# Patient Record
Sex: Female | Born: 1995 | Race: Asian | Hispanic: No | Smoking: Never smoker
Health system: Southern US, Community
[De-identification: ages and names within clinical notes are randomized; demographics above are authoritative.]

## PROBLEM LIST (undated history)

## (undated) DIAGNOSIS — B999 Unspecified infectious disease: Secondary | ICD-10-CM

## (undated) DIAGNOSIS — Z789 Other specified health status: Secondary | ICD-10-CM

## (undated) DIAGNOSIS — L509 Urticaria, unspecified: Secondary | ICD-10-CM

## (undated) HISTORY — PX: NO PAST SURGERIES: SHX2092

## (undated) HISTORY — DX: Urticaria, unspecified: L50.9

## (undated) HISTORY — DX: Unspecified infectious disease: B99.9

---
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
## 2013-01-01 ENCOUNTER — Ambulatory Visit (HOSPITAL_COMMUNITY)
Admission: RE | Admit: 2013-01-01 | Discharge: 2013-01-01 | Disposition: A | Discharge status: Home / Self Care | Payer: Medicaid Other | Source: Ambulatory Visit | Attending: Obstetrics and Gynecology | Admitting: Obstetrics and Gynecology

## 2013-01-01 NOTE — Lactation Note (Signed)
Infant Lactation Consultation Outpatient Visit Note  Patient Name: Kaitlyn Crane   Baby name: Marylynne Keelin Date of Birth: 1996/10/15          DOB: [birth year] Birth Weight:  6 lbs 5.2oz      Today's weight: 12 lbs. 15.2 oz Gestational Age at Delivery: 38.2 weeks   Mom comes in today needing help relatching Jayden to breast.  2 weeks ago,  Mom stated baby refused to latch onto breast.  Baby had been introduced to bottles at 73 months of age.  He was feeding from a regular flow bottle.  Mom has not had baby on the breast for 2 weeks, and has been pumping 2 times a day using the Medela Lactina double pump.  She used to get 8 oz per pumping, and now only gets 5 oz.  Talked about supporting her milk supply with more frequent pumping, talked about using manual pump when at school.  She usually goes 6 hrs without pumping at school.  Mom has flat nipples, and was using a 20 mm nipple shield, but she lost it.  She bought one in the store, but could only get the 24 mm which was too big.  On assessment, breasts are soft, with flat nipples.  Mom states she last pumped last night at 11 pm.  Initiated a 20 mm nipple shield, reviewed proper way to use it.  Assisted Mom in supporting her breast, compressing, and massage.  A small amount of milk introduced into shield.  Baby positioned in cross cradle hold.  He was cooing and playing, and licking the nipple, and would not latch on.  Baby had been fed 2 oz an hour ago by bottle.  Talked about getting a slow flow wide based nipple.  Mom purchased a Medela bottle with nipple.    Plan- 1- increase pumping frequency to support milk supply (15-20 mins about 6 times a day at least) 2-Try to latch Jayden when he is more hungry, using the 20 mm nipple shield 3- Try to latch Jayden when he is sleepy 4- increase skin to skin, and bottle feed Jayden turned in on the breast 5- massage breasts before and during feedings and pumping    Follow-Up  To call us as needed    Judee Clara 01/01/2013, 11:37 AM

## 2013-01-02 ENCOUNTER — Ambulatory Visit (HOSPITAL_COMMUNITY): Payer: Medicaid Other

## 2014-08-16 ENCOUNTER — Encounter: Payer: Self-pay | Admitting: Obstetrics and Gynecology

## 2021-06-01 ENCOUNTER — Emergency Department (HOSPITAL_COMMUNITY)
Admission: EM | Admit: 2021-06-01 | Discharge: 2021-06-01 | Disposition: A | Discharge status: Home / Self Care | Payer: Medicaid Other | Attending: Emergency Medicine | Admitting: Emergency Medicine

## 2021-06-01 ENCOUNTER — Other Ambulatory Visit: Payer: Self-pay

## 2021-06-01 ENCOUNTER — Encounter (HOSPITAL_COMMUNITY): Payer: Self-pay | Admitting: Emergency Medicine

## 2021-06-01 DIAGNOSIS — L739 Follicular disorder, unspecified: Secondary | ICD-10-CM | POA: Diagnosis not present

## 2021-06-01 DIAGNOSIS — R21 Rash and other nonspecific skin eruption: Secondary | ICD-10-CM | POA: Diagnosis present

## 2021-06-01 LAB — URINALYSIS, ROUTINE W REFLEX MICROSCOPIC
Bilirubin Urine: NEGATIVE
Glucose, UA: NEGATIVE mg/dL
Ketones, ur: NEGATIVE mg/dL
Nitrite: NEGATIVE
Protein, ur: NEGATIVE mg/dL
Specific Gravity, Urine: 1.009 (ref 1.005–1.030)
pH: 6 (ref 5.0–8.0)

## 2021-06-01 LAB — CBC WITH DIFFERENTIAL/PLATELET
Abs Immature Granulocytes: 0.03 10*3/uL (ref 0.00–0.07)
Basophils Absolute: 0 10*3/uL (ref 0.0–0.1)
Basophils Relative: 0 %
Eosinophils Absolute: 0.1 10*3/uL (ref 0.0–0.5)
Eosinophils Relative: 1 %
HCT: 34.4 % — ABNORMAL LOW (ref 36.0–46.0)
Hemoglobin: 10.9 g/dL — ABNORMAL LOW (ref 12.0–15.0)
Immature Granulocytes: 0 %
Lymphocytes Relative: 32 %
Lymphs Abs: 3.4 10*3/uL (ref 0.7–4.0)
MCH: 26 pg (ref 26.0–34.0)
MCHC: 31.7 g/dL (ref 30.0–36.0)
MCV: 81.9 fL (ref 80.0–100.0)
Monocytes Absolute: 0.5 10*3/uL (ref 0.1–1.0)
Monocytes Relative: 5 %
Neutro Abs: 6.5 10*3/uL (ref 1.7–7.7)
Neutrophils Relative %: 62 %
Platelets: 270 10*3/uL (ref 150–400)
RBC: 4.2 MIL/uL (ref 3.87–5.11)
RDW: 13.6 % (ref 11.5–15.5)
WBC: 10.5 10*3/uL (ref 4.0–10.5)
nRBC: 0 % (ref 0.0–0.2)

## 2021-06-01 LAB — RPR: RPR Ser Ql: NONREACTIVE

## 2021-06-01 LAB — HIV ANTIBODY (ROUTINE TESTING W REFLEX): HIV Screen 4th Generation wRfx: NONREACTIVE

## 2021-06-01 MED ORDER — MUPIROCIN CALCIUM 2 % EX CREA: 1.0000 "application " | TOPICAL_CREAM | Freq: Two times a day (BID) | CUTANEOUS | 0 refills | Status: DC

## 2021-06-01 MED ORDER — CEPHALEXIN 500 MG PO CAPS: 500.0000 mg | ORAL_CAPSULE | Freq: Four times a day (QID) | ORAL | 0 refills | Status: AC

## 2021-06-01 NOTE — ED Provider Notes (Signed)
Emergency Medicine Provider Triage Evaluation Note  Shambhavi Metheny , a 25 y.o. female  was evaluated in triage.  Pt complains of fluid filled cysts or abscesses on the right forearm, buttocks, and around the vulva / inguinal folds x 3 weeks. The patient reports she was evaluated by her PCP and prescribed antibiotics without relief. The patient reports she has been feeling feverish. She is able to express yellowish fluid followed by blood if she squeezes the sites. She reports she waxes and shaves her groin.  Review of Systems  Positive: Subjective fever Negative: Vaginal discharge, dysuria  Physical Exam  BP 100/67 (BP Location: Right Arm)   Pulse 86   Temp 98.3 F (36.8 C) (Oral)   Resp 18   SpO2 99%  Gen:   Awake, no distress   Resp:  Normal effort  MSK:   Moves extremities without difficulty  Other:  Red swollen nodule on right forearm with open pore  Medical Decision Making  Medically screening exam initiated at 1:02 AM.  Appropriate orders placed.  Tienna Hufford was informed that the remainder of the evaluation will be completed by another provider, this initial triage assessment does not replace that evaluation, and the importance of remaining in the ED until their evaluation is complete.  Skin lesions / cysts   Olene Floss, PA-C 06/01/21 0107    Glynn Octave, MD 06/01/21 [phone]

## 2021-06-01 NOTE — ED Triage Notes (Signed)
Pt has several abscesses, on her right arm, bottom and pelvic area X3 weeks.  She has been "popping them at home."

## 2021-06-01 NOTE — ED Provider Notes (Signed)
[facility]   CSN: [id number] Arrival date & time: 06/01/21  0040     History Chief Complaint  Patient presents with   Abscess    Kaitlyn Crane is a 25 y.o. female.  Patient is a 25 year old female that is presenting for concern for skin infection on her right forearm, buttocks, and pelvis. Patient states that these have been here for 3 weeks.  She was previously seen by her OB/GYN for this and was told to apply antibacterial soap.  She states she has been doing this daily.  She denies any fevers or chills.  She states that she has had to drain these fluid-filled cysts in which she expressed yellow fluid in the past. At this time there are no fluid filled cysts.  She denies any vaginal discharge, dysuria, hematuria, vaginal bleeding.  Patient denies having any pain from this.  She denies any new recent detergents, new exposures.  She denies any unprotected sex.    Patient denies any fevers, chills, chest pain, headache, neck stiffness, shortness of breath, nausea, vomiting, diarrhea, numbness or weakness.  Rash Location:  Shoulder/arm and pelvis Shoulder/arm rash location:  R forearm Pelvic rash location:  L buttock, R buttock and pelvis Duration:  3 weeks Context: not animal contact, not chemical exposure, not food, not medications, not new detergent/soap and not nuts   Associated symptoms: no abdominal pain, no diarrhea, no fatigue, no fever, no headaches, no hoarse voice, no induration, no joint pain, no myalgias, no nausea, no periorbital edema, no shortness of breath, no sore throat, no throat swelling, no tongue swelling, no URI, not vomiting and not wheezing       Past Medical History:  Diagnosis Date   Infection 2012   UTERUS ?   No pertinent past medical history     Patient Active Problem List   Diagnosis Date Noted   Encounter for insertion of mirena IUD - inserted 12/12/12 12/12/2012   SVD (spontaneous vaginal  delivery) 09/23/2012   Chorioamnionitis 09/23/2012   Late prenatal care 08/27/2012   Normal pregnancy, first 08/11/2012    Past Surgical History:  Procedure Laterality Date   NO PAST SURGERIES       OB History     Gravida  1   Para  1   Term  1   Preterm  0   AB  0   Living  1      SAB  0   IAB  0   Ectopic  0   Multiple  0   Live Births  1           Family History  Problem Relation Age of Onset   Alcohol abuse Neg Hx     Social History   Tobacco Use   Smoking status: Never   Smokeless tobacco: Never  Substance Use Topics   Alcohol use: No    Home Medications Prior to Admission medications   Medication Sig Start Date End Date Taking? Authorizing Provider  cephALEXin (KEFLEX) 500 MG capsule Take 1 capsule (500 mg total) by mouth 4 (four) times daily for 7 days. 06/01/21 06/08/21 Yes Lottie DawsonByouk, Dilan Fullenwider, MD  mupirocin cream (BACTROBAN) 2 % Apply 1 application topically 2 (two) times daily. 06/01/21  Yes Lottie DawsonByouk, Claudia Alvizo, MD  dibucaine (NUPERCAINAL) 1 % OINT Place 1 application rectally as needed. 09/25/12   Lavera GuiseKrebsbach, Mary, CNM  Ferrous Sulfate (IRON) 325 (65 FE) MG TABS Take 1 tablet by mouth 2 (two)  times daily. 07/28/12   Osborn Coho, MD  ferrous sulfate 325 (65 FE) MG tablet Take 325 mg by mouth daily with breakfast.    [provider]  ferrous sulfate 325 (65 FE) MG tablet Take 1 tablet (325 mg total) by mouth 2 (two) times daily. 07/28/12   Lavera Guise, CNM  ibuprofen (ADVIL,MOTRIN) 600 MG tablet Take 1 tablet (600 mg total) by mouth every 6 (six) hours as needed for pain. 09/25/12   Lavera Guise, CNM  oxyCODONE-acetaminophen (PERCOCET/ROXICET) 5-325 MG per tablet Take 1 tablet by mouth every 4 (four) hours as needed (moderate - severe pain). 09/25/12   Lavera Guise, CNM  Prenatal MV-Min-Fe Fum-FA-DHA [facility] PRENATAL EARLY SHIELD) 27-0.8 & 200 MG MISC Take 1 tablet by mouth daily. 08/11/12   Haygood, Maris Berger, MD  Prenatal Vit-Fe  Fumarate-FA (PRENATAL MULTIVITAMIN) TABS Take 1 tablet by mouth daily.    [provider]    Allergies    Patient has no known allergies.  Review of Systems   Review of Systems  Constitutional:  Negative for chills, diaphoresis, fatigue and fever.  HENT:  Negative for congestion, dental problem, ear discharge, ear pain, facial swelling, hearing loss, hoarse voice, nosebleeds, postnasal drip, rhinorrhea, sinus pain, sneezing, sore throat and trouble swallowing.   Eyes:  Negative for pain and visual disturbance.  Respiratory:  Negative for cough, chest tightness, shortness of breath, wheezing and stridor.   Cardiovascular:  Negative for chest pain, palpitations and leg swelling.  Gastrointestinal:  Negative for abdominal distention, abdominal pain, blood in stool, constipation, diarrhea, nausea and vomiting.  Endocrine: Negative for polydipsia and polyuria.  Genitourinary:  Negative for difficulty urinating, dysuria, flank pain, frequency, hematuria, urgency, vaginal bleeding and vaginal discharge.  Musculoskeletal:  Negative for arthralgias, myalgias, neck pain and neck stiffness.  Skin:  Positive for rash. Negative for wound.  Allergic/Immunologic: Negative for environmental allergies and food allergies.  Neurological:  Negative for dizziness, seizures, syncope, facial asymmetry, speech difficulty, weakness, light-headedness, numbness and headaches.  Psychiatric/Behavioral:  Negative for agitation, behavioral problems and confusion.    Physical Exam Updated Vital Signs BP 118/77   Pulse 90   Temp 98.5 F (36.9 C) (Oral)   Resp 16   SpO2 100%   Physical Exam Vitals and nursing note reviewed.  Constitutional:      General: She is not in acute distress.    Appearance: Normal appearance. She is normal weight. She is not ill-appearing.  HENT:     Head: Normocephalic and atraumatic.     Right Ear: External ear normal.     Left Ear: External ear normal.     Nose: Nose  normal. No congestion.     Mouth/Throat:     Mouth: Mucous membranes are moist.     Pharynx: Oropharynx is clear. No oropharyngeal exudate or posterior oropharyngeal erythema.  Eyes:     General: No visual field deficit.    Extraocular Movements: Extraocular movements intact.     Conjunctiva/sclera: Conjunctivae normal.     Pupils: Pupils are equal, round, and reactive to light.  Neck:     Vascular: No carotid bruit.  Cardiovascular:     Rate and Rhythm: Normal rate and regular rhythm.     Pulses: Normal pulses.     Heart sounds: Normal heart sounds. No murmur heard. Pulmonary:     Effort: Pulmonary effort is normal. No respiratory distress.     Breath sounds: Normal breath sounds. No stridor. No wheezing, rhonchi or rales.  Chest:     Chest wall: No tenderness.  Abdominal:     General: Bowel sounds are normal. There is no distension.     Palpations: Abdomen is soft.     Tenderness: There is no abdominal tenderness. There is no right CVA tenderness, left CVA tenderness, guarding or rebound.  Musculoskeletal:        General: No swelling or tenderness. Normal range of motion.     Cervical back: Normal range of motion and neck supple. No rigidity, tenderness or bony tenderness.     Thoracic back: Normal. No tenderness or bony tenderness.     Lumbar back: Normal. No tenderness or bony tenderness.     Right lower leg: No edema.     Left lower leg: No edema.  Skin:    General: Skin is warm and dry.     Coloration: Skin is not jaundiced.     Comments: Folliculitis located on the right forearm, bilateral buttocks, mons pubis No signs of infection, cellulitis, or abscess. Papules on an erythematous base around hair follicles.   Neurological:     General: No focal deficit present.     Mental Status: She is alert and oriented to person, place, and time. Mental status is at baseline.     Cranial Nerves: Cranial nerves are intact. No cranial nerve deficit, dysarthria or facial asymmetry.      Sensory: Sensation is intact. No sensory deficit.     Motor: Motor function is intact. No weakness.     Coordination: Coordination is intact. Finger-Nose-Finger Test normal.     Gait: Gait is intact. Gait normal.  Psychiatric:        Mood and Affect: Mood normal.        Behavior: Behavior normal.        Thought Content: Thought content normal.        Judgment: Judgment normal.    ED Results / Procedures / Treatments   Labs (all labs ordered are listed, but only abnormal results are displayed) Labs Reviewed  CBC WITH DIFFERENTIAL/PLATELET - Abnormal; Notable for the following components:      Result Value   Hemoglobin 10.9 (*)    HCT 34.4 (*)    All other components within normal limits  URINALYSIS, ROUTINE W REFLEX MICROSCOPIC - Abnormal; Notable for the following components:   Hgb urine dipstick MODERATE (*)    Leukocytes,Ua TRACE (*)    Bacteria, UA RARE (*)    All other components within normal limits  RPR  HIV ANTIBODY (ROUTINE TESTING W REFLEX)  GC/CHLAMYDIA PROBE AMP ([place]) NOT AT [facility]    EKG None  Radiology No results found.  Procedures Procedures   Medications Ordered in ED Medications - No data to display  ED Course  I have reviewed the triage vital signs and the nursing notes.  Pertinent labs & imaging results that were available during my care of the patient were reviewed by me and considered in my medical decision making (see chart for details).    MDM Rules/Calculators/A&P                         Ellamae Bellissimo is a 25 y.o. female that is presenting for concern for skin infection on her right forearm, buttocks, and pelvis Patient is hemodynamically stable and in no acute distress.  Patient has had 3 weeks of symptoms consistent with folliculitis.  There are no signs of abscess or cellulitis. She has papules  on an erythematous base with no signs of fluctuance. There are no abscesses to drain. Her labs are unremarkable.   Patient symptoms are most  consistent with folliculitis.  Patient discharged home with 7-day course of Keflex and Bactroban.  Patient will follow up with her PCP.  Patient states compliance and understanding of the plan. I explained labs and imaging to the patient. No further questions at this time from the patient.  The patient is safe and stable for discharge at this time with return precautions provided and a plan for follow-up care in place as needed  The plan for this patient was discussed with Dr. Bernette Mayers, who voiced agreement and who oversaw evaluation and treatment of this patient.   Final Clinical Impression(s) / ED Diagnoses Final diagnoses:  Folliculitis    Rx / DC Orders ED Discharge Orders          Ordered    cephALEXin (KEFLEX) 500 MG capsule  4 times daily        06/01/21 1014    mupirocin cream (BACTROBAN) 2 %  2 times daily        06/01/21 1015             Lottie Dawson, MD 06/01/21 1702    Pollyann Savoy, MD 06/02/21 [phone]

## 2021-06-01 NOTE — Discharge Instructions (Addendum)
Please take keflex four times daily for the next seven days. Apply Bactroban to the affected areas. Please follow up with your PCP.

## 2021-08-16 ENCOUNTER — Ambulatory Visit: Payer: Self-pay | Admitting: *Deleted

## 2021-08-16 NOTE — Telephone Encounter (Signed)
Patient reports her symptoms started Sunday am- 10/23.Patient states she had symptoms: hard time breathing, felt stressed, heart racing, palpitations- patient thought anxiety related. Over the follow days her symptoms have continued with no appetite, no smell, weakness added. Patient advised ED for evaluation. Encouraged patient to follow up with NP appointment with provider for continued care.  Reason for Disposition  Difficulty breathing  Answer Assessment - Initial Assessment Questions 1. DESCRIPTION: "Please describe your heart rate or heartbeat that you are having" (e.g., fast/slow, regular/irregular, skipped or extra beats, "palpitations")     Palpitations, extra beats 2. ONSET: "When did it start?" (Minutes, hours or days)      Sunday 3. DURATION: "How long does it last" (e.g., seconds, minutes, hours)     Hours/days 4. PATTERN "Does it come and go, or has it been constant since it started?"  "Does it get worse with exertion?"   "Are you feeling it now?"     Comes and goes, yes 5. TAP: "Using your hand, can you tap out what you are feeling on a chair or table in front of you, so that I can hear?" (Note: not all patients can do this)       na 6. HEART RATE: "Can you tell me your heart rate?" "How many beats in 15 seconds?"  (Note: not all patients can do this)       13 0 7. RECURRENT SYMPTOM: "Have you ever had this before?" If Yes, ask: "When was the last time?" and "What happened that time?"      no 8. CAUSE: "What do you think is causing the palpitations?"     Not sure 9. CARDIAC HISTORY: "Do you have any history of heart disease?" (e.g., heart attack, angina, bypass surgery, angioplasty, arrhythmia)      no 10. OTHER SYMPTOMS: "Do you have any other symptoms?" (e.g., dizziness, chest pain, sweating, difficulty breathing)       Dizziness, chest pain 11. PREGNANCY: "Is there any chance you are pregnant?" "When was your last menstrual period?"       No- IUD  Protocols used: Heart  Rate and Heartbeat Questions-A-AH

## 2021-08-17 ENCOUNTER — Telehealth: Payer: Self-pay | Admitting: Family Medicine

## 2021-08-17 DIAGNOSIS — R5383 Other fatigue: Secondary | ICD-10-CM

## 2021-08-17 NOTE — Progress Notes (Signed)
Needs in person eval due to on going evaluate HR reported. Has an appt tomorrow  [place]

## 2021-08-18 ENCOUNTER — Other Ambulatory Visit: Payer: Self-pay

## 2021-08-18 ENCOUNTER — Ambulatory Visit (INDEPENDENT_AMBULATORY_CARE_PROVIDER_SITE_OTHER): Payer: Medicaid Other | Admitting: Family

## 2021-08-18 ENCOUNTER — Encounter: Payer: Self-pay | Admitting: Family

## 2021-08-18 VITALS — BP 110/70 | HR 98 | Temp 98.0°F | Ht 60.0 in | Wt 101.2 lb

## 2021-08-18 DIAGNOSIS — D649 Anemia, unspecified: Secondary | ICD-10-CM | POA: Diagnosis not present

## 2021-08-18 DIAGNOSIS — R8290 Unspecified abnormal findings in urine: Secondary | ICD-10-CM | POA: Diagnosis not present

## 2021-08-18 DIAGNOSIS — R5383 Other fatigue: Secondary | ICD-10-CM | POA: Diagnosis not present

## 2021-08-18 NOTE — Progress Notes (Signed)
Kaitlyn Crane is a 25 y.o. female with the following history as recorded in EpicCare:  Patient Active Problem List   Diagnosis Date Noted   Encounter for insertion of mirena IUD - inserted 12/12/12 12/12/2012   SVD (spontaneous vaginal delivery) 09/23/2012   Chorioamnionitis 09/23/2012   Late prenatal care 08/27/2012   Normal pregnancy, first 08/11/2012    Current Outpatient Medications  Medication Sig Dispense Refill   levonorgestrel (MIRENA, 52 MG,) 20 MCG/DAY IUD Mirena 20 mcg/24 hours (8 yrs) 52 mg intrauterine device  Take 1 device by intrauterine route.     No current facility-administered medications for this visit.    Allergies: Patient has no known allergies.  Past Medical History:  Diagnosis Date   Infection 2012   UTERUS ?   No pertinent past medical history     Past Surgical History:  Procedure Laterality Date   NO PAST SURGERIES      Family History  Problem Relation Age of Onset   Alcohol abuse Neg Hx     Social History   Tobacco Use   Smoking status: Never   Smokeless tobacco: Never  Substance Use Topics   Alcohol use: No    Subjective:   Started 2 weeks ago with sensation of palpitations/ fatigue; feels like appetite has been down; admits that her diet is vegan- does try to get tofu for protein source; history of anemia "entire life." History of Vitamin D deficiency in 2018;  Admits that stress level has been high this year- divorced in June/ had to move to new home/ working on finances; works 6 days/ week as Engineer, civil (consulting);      Objective:  Vitals:   08/18/21 1409  BP: 110/70  Pulse: 98  Temp: 98 F (36.7 C)  TempSrc: Oral  SpO2: 98%  Weight: 101 lb 3.2 oz (45.9 kg)  Height: 5' (1.524 m)    General: Well developed, well nourished, in no acute distress  Skin : Warm and dry.  Head: Normocephalic and atraumatic  Eyes: Sclera and conjunctiva clear; pupils round and reactive to light; extraocular movements intact  Ears: External normal; canals clear;  tympanic membranes normal  Oropharynx: Pink, supple. No suspicious lesions  Neck: Supple without thyromegaly, adenopathy  Lungs: Respirations unlabored; clear to auscultation bilaterally without wheeze, rales, rhonchi  CVS exam: normal rate and regular rhythm.  Musculoskeletal: No deformities; no active joint inflammation  Extremities: No edema, cyanosis, clubbing  Vessels: Symmetric bilaterally  Neurologic: Alert and oriented; speech intact; face symmetrical; moves all extremities well; CNII-XII intact without focal deficit   Assessment:  1. Other fatigue   2. Abnormal urine odor   3. Anemia, unspecified type     Plan:  ? Anemia vs anxiety; patient is vegan and am concerned that she is not getting enough protein/ calories; will update labs today and determine follow up based on results; if labs are normal, to consider trial of anxiety medication.  This visit occurred during the SARS-CoV-2 public health emergency.  Safety protocols were in place, including screening questions prior to the visit, additional usage of staff PPE, and extensive cleaning of exam room while observing appropriate contact time as indicated for disinfecting solutions.    No follow-ups on file.  Orders Placed This Encounter  Procedures   Urine Culture   CBC with Differential/Platelet   Comp Met (CMET)   TSH   B12   Iron, TIBC and Ferritin Panel    Requested Prescriptions    No prescriptions requested or  ordered in this encounter     

## 2021-08-19 ENCOUNTER — Encounter: Payer: Self-pay | Admitting: Family

## 2021-08-21 ENCOUNTER — Telehealth: Payer: Self-pay

## 2021-08-21 ENCOUNTER — Encounter: Payer: Self-pay | Admitting: Family

## 2021-08-21 ENCOUNTER — Other Ambulatory Visit: Payer: Self-pay | Admitting: Family

## 2021-08-21 ENCOUNTER — Telehealth: Payer: Self-pay | Admitting: Family

## 2021-08-21 LAB — URINE CULTURE
MICRO NUMBER:: 12595830
SPECIMEN QUALITY:: ADEQUATE

## 2021-08-21 LAB — CBC WITH DIFFERENTIAL/PLATELET
Absolute Monocytes: 273 cells/uL (ref 200–950)
Basophils Absolute: 29 cells/uL (ref 0–200)
Basophils Relative: 0.5 %
Eosinophils Absolute: 41 cells/uL (ref 15–500)
Eosinophils Relative: 0.7 %
HCT: 39.4 % (ref 35.0–45.0)
Hemoglobin: 12.6 g/dL (ref 11.7–15.5)
Lymphs Abs: 2250 cells/uL (ref 850–3900)
MCH: 25.8 pg — ABNORMAL LOW (ref 27.0–33.0)
MCHC: 32 g/dL (ref 32.0–36.0)
MCV: 80.6 fL (ref 80.0–100.0)
MPV: 9.2 fL (ref 7.5–12.5)
Monocytes Relative: 4.7 %
Neutro Abs: 3207 cells/uL (ref 1500–7800)
Neutrophils Relative %: 55.3 %
Platelets: 282 10*3/uL (ref 140–400)
RBC: 4.89 10*6/uL (ref 3.80–5.10)
RDW: 13.1 % (ref 11.0–15.0)
Total Lymphocyte: 38.8 %
WBC: 5.8 10*3/uL (ref 3.8–10.8)

## 2021-08-21 LAB — COMPREHENSIVE METABOLIC PANEL
AG Ratio: 1.3 (calc) (ref 1.0–2.5)
ALT: 6 U/L (ref 6–29)
AST: 14 U/L (ref 10–30)
Albumin: 4.6 g/dL (ref 3.6–5.1)
Alkaline phosphatase (APISO): 36 U/L (ref 31–125)
BUN: 11 mg/dL (ref 7–25)
CO2: 28 mmol/L (ref 20–32)
Calcium: 9.4 mg/dL (ref 8.6–10.2)
Chloride: 102 mmol/L (ref 98–110)
Creat: 0.74 mg/dL (ref 0.50–0.96)
Globulin: 3.5 g/dL (calc) (ref 1.9–3.7)
Glucose, Bld: 86 mg/dL (ref 65–99)
Potassium: 3.9 mmol/L (ref 3.5–5.3)
Sodium: 137 mmol/L (ref 135–146)
Total Bilirubin: 0.6 mg/dL (ref 0.2–1.2)
Total Protein: 8.1 g/dL (ref 6.1–8.1)

## 2021-08-21 LAB — IRON,TIBC AND FERRITIN PANEL
%SAT: 39 % (calc) (ref 16–45)
Ferritin: 153 ng/mL (ref 16–154)
Iron: 104 ug/dL (ref 40–190)
TIBC: 264 mcg/dL (calc) (ref 250–450)

## 2021-08-21 LAB — VITAMIN B12: Vitamin B-12: 317 pg/mL (ref 200–1100)

## 2021-08-21 LAB — TSH: TSH: 1.06 mIU/L

## 2021-08-21 MED ORDER — SULFAMETHOXAZOLE-TRIMETHOPRIM 800-160 MG PO TABS: 1.0000 | ORAL_TABLET | Freq: Two times a day (BID) | ORAL | 0 refills | Status: DC

## 2021-08-21 NOTE — Telephone Encounter (Signed)
Labs have been reviewed with patient over MyChart. She understands that she has UTI. She had concerns about G/C testing from ER in August; she was informed that no G/C test was done in August- order was placed but test was not collected. She was asked to make an appointment if she wants G/C testing.

## 2021-08-21 NOTE — Telephone Encounter (Signed)
Pt was needing to go over her lab results, she stated it is very urgent please advise.

## 2021-08-21 NOTE — Telephone Encounter (Signed)
Advised pt on previous mychart message Vernona Rieger has not reviewed labs yet and will take time

## 2021-08-21 NOTE — Telephone Encounter (Signed)
Previous note sent to PCP °

## 2021-08-25 ENCOUNTER — Encounter: Payer: Self-pay | Admitting: Family

## 2021-08-28 ENCOUNTER — Telehealth: Payer: Self-pay | Admitting: Family

## 2021-08-28 NOTE — Telephone Encounter (Signed)
Please call her and get the rash she was mentioning clarified. My concern would be that she had an allergic reaction to the antibiotic- not folliculitis as she was mentioning.

## 2021-08-29 ENCOUNTER — Other Ambulatory Visit: Payer: Self-pay | Admitting: Family

## 2021-08-29 MED ORDER — MUPIROCIN CALCIUM 2 % EX CREA: 1.0000 "application " | TOPICAL_CREAM | Freq: Two times a day (BID) | CUTANEOUS | 0 refills | Status: DC

## 2021-08-29 NOTE — Telephone Encounter (Signed)
I have called the pt and informed her of the message below. She stated understanding.

## 2021-08-29 NOTE — Telephone Encounter (Signed)
Rx of mupirocin cream (BACTROBAN) 2 % has been sent today and pt has been notified.

## 2021-08-29 NOTE — Telephone Encounter (Signed)
I have called the pt back to get clarification. She stated that she had recently shaved her vaginal area with a new shaver and she is now having the same folliculitis like rashes/ bumps in her vaginal and anal area. She learned that keeping the area clean and putting the cream on would help make the area heal. In the ER she got antibiotics and cream and it worked well.   These bumps are only in the areas she shaved and would like a cream to start.

## 2021-10-31 ENCOUNTER — Encounter: Payer: Medicaid Other | Admitting: Family

## 2021-11-16 ENCOUNTER — Other Ambulatory Visit (HOSPITAL_COMMUNITY)
Admission: RE | Admit: 2021-11-16 | Discharge: 2021-11-16 | Disposition: A | Discharge status: Home / Self Care | Payer: Medicaid Other | Source: Ambulatory Visit | Attending: Family | Admitting: Family

## 2021-11-16 ENCOUNTER — Encounter: Payer: Self-pay | Admitting: Family

## 2021-11-16 ENCOUNTER — Ambulatory Visit (INDEPENDENT_AMBULATORY_CARE_PROVIDER_SITE_OTHER): Payer: Medicaid Other | Admitting: Family

## 2021-11-16 VITALS — BP 110/50 | HR 78 | Temp 98.1°F | Ht 60.0 in | Wt 98.6 lb

## 2021-11-16 DIAGNOSIS — Z113 Encounter for screening for infections with a predominantly sexual mode of transmission: Secondary | ICD-10-CM | POA: Diagnosis not present

## 2021-11-16 DIAGNOSIS — Z0000 Encounter for general adult medical examination without abnormal findings: Secondary | ICD-10-CM

## 2021-11-16 DIAGNOSIS — R8290 Unspecified abnormal findings in urine: Secondary | ICD-10-CM | POA: Diagnosis not present

## 2021-11-16 DIAGNOSIS — Z13220 Encounter for screening for lipoid disorders: Secondary | ICD-10-CM

## 2021-11-16 DIAGNOSIS — Z124 Encounter for screening for malignant neoplasm of cervix: Secondary | ICD-10-CM | POA: Diagnosis not present

## 2021-11-16 NOTE — Progress Notes (Addendum)
Kaitlyn Crane is a 26 y.o. female with the following history as recorded in EpicCare:  Patient Active Problem List   Diagnosis Date Noted   Encounter for insertion of mirena IUD - inserted 12/12/12 12/12/2012   SVD (spontaneous vaginal delivery) 09/23/2012   Chorioamnionitis 09/23/2012   Late prenatal care 08/27/2012   Normal pregnancy, first 08/11/2012    Current Outpatient Medications  Medication Sig Dispense Refill   levonorgestrel (MIRENA, 52 MG,) 20 MCG/DAY IUD Mirena 20 mcg/24 hours (8 yrs) 52 mg intrauterine device  Take 1 device by intrauterine route.     mupirocin cream (BACTROBAN) 2 % Apply 1 application topically 2 (two) times daily. (Patient not taking: Reported on 11/16/2021) 15 g 0   No current facility-administered medications for this visit.    Allergies: Patient has no known allergies.  Past Medical History:  Diagnosis Date   Infection 2012   UTERUS ?   No pertinent past medical history     Past Surgical History:  Procedure Laterality Date   NO PAST SURGERIES      Family History  Problem Relation Age of Onset   Alcohol abuse Neg Hx     Social History   Tobacco Use   Smoking status: Never   Smokeless tobacco: Never  Substance Use Topics   Alcohol use: No    Subjective:  Presents for yearly CPE; would like to get pap smear updated and STD screen; concern that UTI from November 2022 has not cleared completely-urine still has strong smell;  LMP- IUD/ per patient due to be changed in 2024;   No other acute concerns today;   Review of Systems  Constitutional: Negative.   HENT: Negative.    Eyes: Negative.   Respiratory: Negative.    Cardiovascular: Negative.   Gastrointestinal: Negative.   Genitourinary: Negative.   Musculoskeletal: Negative.   Skin: Negative.   Neurological: Negative.   Endo/Heme/Allergies: Negative.   Psychiatric/Behavioral: Negative.         Objective:  Vitals:   11/16/21 1321  BP: (!) 110/50  Pulse: 78  Temp: 98.1 F  (36.7 C)  TempSrc: Oral  SpO2: 98%  Weight: 98 lb 9.6 oz (44.7 kg)  Height: 5' (1.524 m)    General: Well developed, well nourished, in no acute distress  Skin : Warm and dry.  Head: Normocephalic and atraumatic  Eyes: Sclera and conjunctiva clear; pupils round and reactive to light; extraocular movements intact  Ears: External normal; canals clear;  Lungs: Respirations unlabored; clear to auscultation bilaterally without wheeze, rales, rhonchi  CVS exam: normal rate and regular rhythm.  Abdomen: Soft; nontender; nondistended;  Musculoskeletal: No deformities; no active joint inflammation  Extremities: No edema, cyanosis, clubbing  Vessels: Symmetric bilaterally  Neurologic: Alert and oriented; speech intact; face symmetrical; moves all extremities well; CNII-XII intact without focal deficit  Pelvic exam: normal external genitalia, vulva, vagina, cervix, uterus and adnexa. IUD strings noted;    Assessment:  1. PE (physical exam), annual   2. Cervical cancer screening   3. Abnormal urine odor   4. Lipid screening   5. Screening for STD (sexually transmitted disease)     Plan:  Age appropriate preventive healthcare needs addressed; encouraged regular eye doctor and dental exams; encouraged regular exercise; will update labs and refills as needed today; follow-up to be determined; She understands that IUD management will have to done by her GYN- has seen [facility] in the past and will contact them regarding follow up;  This visit occurred during the SARS-CoV-2 public health emergency.  Safety protocols were in place, including screening questions prior to the visit, additional usage of staff PPE, and extensive cleaning of exam room while observing appropriate contact time as indicated for disinfecting solutions.    No follow-ups on file.  Orders Placed This Encounter  Procedures   Urine Culture   CBC with Differential/Platelet   Comp Met (CMET)   TSH   Lipid panel    HIV Antibody (routine testing w rflx)   RPR    Requested Prescriptions    No prescriptions requested or ordered in this encounter

## 2021-11-17 LAB — COMPREHENSIVE METABOLIC PANEL
ALT: 7 U/L (ref 0–35)
AST: 15 U/L (ref 0–37)
Albumin: 4.7 g/dL (ref 3.5–5.2)
Alkaline Phosphatase: 35 U/L — ABNORMAL LOW (ref 39–117)
BUN: 5 mg/dL — ABNORMAL LOW (ref 6–23)
CO2: 33 mEq/L — ABNORMAL HIGH (ref 19–32)
Calcium: 9.1 mg/dL (ref 8.4–10.5)
Chloride: 102 mEq/L (ref 96–112)
Creatinine, Ser: 0.66 mg/dL (ref 0.40–1.20)
GFR: 120.07 mL/min (ref >60.00)
Glucose, Bld: 98 mg/dL (ref 70–99)
Potassium: 3.8 mEq/L (ref 3.5–5.1)
Sodium: 138 mEq/L (ref 135–145)
Total Bilirubin: 0.6 mg/dL (ref 0.2–1.2)
Total Protein: 8 g/dL (ref 6.0–8.3)

## 2021-11-17 LAB — CBC WITH DIFFERENTIAL/PLATELET
Basophils Absolute: 0 10*3/uL (ref 0.0–0.1)
Basophils Relative: 0.7 % (ref 0.0–3.0)
Eosinophils Absolute: 0 10*3/uL (ref 0.0–0.7)
Eosinophils Relative: 0.4 % (ref 0.0–5.0)
HCT: 40.3 % (ref 36.0–46.0)
Hemoglobin: 12.6 g/dL (ref 12.0–15.0)
Lymphocytes Relative: 24.9 % (ref 12.0–46.0)
Lymphs Abs: 1.5 10*3/uL (ref 0.7–4.0)
MCHC: 31.4 g/dL (ref 30.0–36.0)
MCV: 81.2 fl (ref 78.0–100.0)
Monocytes Absolute: 0.2 10*3/uL (ref 0.1–1.0)
Monocytes Relative: 3.7 % (ref 3.0–12.0)
Neutro Abs: 4.2 10*3/uL (ref 1.4–7.7)
Neutrophils Relative %: 70.3 % (ref 43.0–77.0)
Platelets: 273 10*3/uL (ref 150.0–400.0)
RBC: 4.96 Mil/uL (ref 3.87–5.11)
RDW: 13.7 % (ref 11.5–15.5)
WBC: 6 10*3/uL (ref 4.0–10.5)

## 2021-11-17 LAB — TSH: TSH: 0.43 u[IU]/mL (ref 0.35–5.50)

## 2021-11-17 LAB — LIPID PANEL
Cholesterol: 109 mg/dL (ref 0–200)
HDL: 35.3 mg/dL — ABNORMAL LOW (ref >39.00)
LDL Cholesterol: 55 mg/dL (ref 0–99)
NonHDL: 73.97
Total CHOL/HDL Ratio: 3
Triglycerides: 95 mg/dL (ref 0.0–149.0)
VLDL: 19 mg/dL (ref 0.0–40.0)

## 2021-11-19 LAB — URINE CULTURE
MICRO NUMBER:: 12955116
SPECIMEN QUALITY:: ADEQUATE

## 2021-11-19 LAB — HIV ANTIBODY (ROUTINE TESTING W REFLEX): HIV 1&2 Ab, 4th Generation: NONREACTIVE

## 2021-11-19 LAB — RPR: RPR Ser Ql: NONREACTIVE

## 2021-11-20 ENCOUNTER — Other Ambulatory Visit: Payer: Self-pay | Admitting: Family

## 2021-11-20 DIAGNOSIS — N390 Urinary tract infection, site not specified: Secondary | ICD-10-CM

## 2021-11-20 LAB — CYTOLOGY - PAP
Chlamydia: NEGATIVE
Comment: NEGATIVE
Comment: NEGATIVE
Comment: NEGATIVE
Comment: NEGATIVE
Comment: NORMAL
HPV 16: NEGATIVE
HPV 18 / 45: NEGATIVE
High risk HPV: POSITIVE — AB
Neisseria Gonorrhea: NEGATIVE
Trichomonas: NEGATIVE

## 2021-11-20 MED ORDER — SULFAMETHOXAZOLE-TRIMETHOPRIM 800-160 MG PO TABS: 1.0000 | ORAL_TABLET | Freq: Two times a day (BID) | ORAL | 0 refills | Status: DC

## 2021-11-21 ENCOUNTER — Other Ambulatory Visit: Payer: Self-pay

## 2021-11-21 MED ORDER — MUPIROCIN 2 % EX OINT: 1.0000 "application " | TOPICAL_OINTMENT | Freq: Two times a day (BID) | CUTANEOUS | 0 refills | Status: DC | PRN

## 2021-11-21 NOTE — Telephone Encounter (Signed)
Pt did ask about the cream on her med list changed to the ointment for her shaving bumps.  I have penned the rx

## 2021-11-22 DIAGNOSIS — D649 Anemia, unspecified: Secondary | ICD-10-CM | POA: Diagnosis not present

## 2021-11-22 DIAGNOSIS — Z01419 Encounter for gynecological examination (general) (routine) without abnormal findings: Secondary | ICD-10-CM | POA: Diagnosis not present

## 2021-11-22 DIAGNOSIS — N898 Other specified noninflammatory disorders of vagina: Secondary | ICD-10-CM | POA: Diagnosis not present

## 2021-11-22 DIAGNOSIS — E559 Vitamin D deficiency, unspecified: Secondary | ICD-10-CM | POA: Diagnosis not present

## 2021-11-29 ENCOUNTER — Encounter: Payer: Self-pay | Admitting: Family

## 2021-11-29 ENCOUNTER — Other Ambulatory Visit (INDEPENDENT_AMBULATORY_CARE_PROVIDER_SITE_OTHER): Payer: Medicaid Other

## 2021-11-29 DIAGNOSIS — N390 Urinary tract infection, site not specified: Secondary | ICD-10-CM

## 2021-11-30 LAB — URINE CULTURE
MICRO NUMBER:: 13012405
Result:: NO GROWTH
SPECIMEN QUALITY:: ADEQUATE

## 2022-02-02 ENCOUNTER — Emergency Department (HOSPITAL_COMMUNITY)
Admission: EM | Admit: 2022-02-02 | Discharge: 2022-02-03 | Disposition: A | Discharge status: Other Acute Inpatient Hospital | Payer: Medicaid Other | Source: Home / Self Care | Attending: Emergency Medicine | Admitting: Emergency Medicine

## 2022-02-02 DIAGNOSIS — Z20822 Contact with and (suspected) exposure to covid-19: Secondary | ICD-10-CM | POA: Insufficient documentation

## 2022-02-02 DIAGNOSIS — R443 Hallucinations, unspecified: Secondary | ICD-10-CM | POA: Diagnosis not present

## 2022-02-02 DIAGNOSIS — F29 Unspecified psychosis not due to a substance or known physiological condition: Secondary | ICD-10-CM | POA: Insufficient documentation

## 2022-02-02 DIAGNOSIS — R825 Elevated urine levels of drugs, medicaments and biological substances: Secondary | ICD-10-CM | POA: Insufficient documentation

## 2022-02-03 ENCOUNTER — Encounter (HOSPITAL_COMMUNITY): Payer: Self-pay | Admitting: *Deleted

## 2022-02-03 ENCOUNTER — Other Ambulatory Visit: Payer: Self-pay

## 2022-02-03 ENCOUNTER — Emergency Department (HOSPITAL_COMMUNITY): Payer: Medicaid Other

## 2022-02-03 ENCOUNTER — Inpatient Hospital Stay (HOSPITAL_COMMUNITY)
Admission: AD | Admit: 2022-02-03 | Discharge: 2022-02-07 | DRG: 897 | Disposition: A | Discharge status: Home / Self Care | Payer: Medicaid Other | Source: Intra-hospital | Attending: Psychiatry | Admitting: Psychiatry

## 2022-02-03 ENCOUNTER — Encounter (HOSPITAL_COMMUNITY): Payer: Self-pay | Admitting: Psychiatry

## 2022-02-03 ENCOUNTER — Other Ambulatory Visit: Payer: Self-pay | Admitting: Psychiatry

## 2022-02-03 DIAGNOSIS — Z681 Body mass index (BMI) 19 or less, adult: Secondary | ICD-10-CM | POA: Diagnosis not present

## 2022-02-03 DIAGNOSIS — F419 Anxiety disorder, unspecified: Secondary | ICD-10-CM | POA: Diagnosis present

## 2022-02-03 DIAGNOSIS — F15950 Other stimulant use, unspecified with stimulant-induced psychotic disorder with delusions: Principal | ICD-10-CM | POA: Diagnosis present

## 2022-02-03 DIAGNOSIS — Z789 Other specified health status: Secondary | ICD-10-CM | POA: Diagnosis present

## 2022-02-03 DIAGNOSIS — N390 Urinary tract infection, site not specified: Secondary | ICD-10-CM | POA: Diagnosis present

## 2022-02-03 DIAGNOSIS — F1999 Other psychoactive substance use, unspecified with unspecified psychoactive substance-induced disorder: Principal | ICD-10-CM | POA: Diagnosis present

## 2022-02-03 DIAGNOSIS — R4182 Altered mental status, unspecified: Secondary | ICD-10-CM | POA: Diagnosis not present

## 2022-02-03 DIAGNOSIS — E876 Hypokalemia: Secondary | ICD-10-CM | POA: Diagnosis present

## 2022-02-03 DIAGNOSIS — Z20822 Contact with and (suspected) exposure to covid-19: Secondary | ICD-10-CM | POA: Diagnosis present

## 2022-02-03 DIAGNOSIS — F32A Depression, unspecified: Secondary | ICD-10-CM | POA: Diagnosis present

## 2022-02-03 DIAGNOSIS — F29 Unspecified psychosis not due to a substance or known physiological condition: Secondary | ICD-10-CM | POA: Diagnosis present

## 2022-02-03 DIAGNOSIS — R443 Hallucinations, unspecified: Secondary | ICD-10-CM | POA: Diagnosis not present

## 2022-02-03 DIAGNOSIS — Z975 Presence of (intrauterine) contraceptive device: Secondary | ICD-10-CM

## 2022-02-03 DIAGNOSIS — E538 Deficiency of other specified B group vitamins: Secondary | ICD-10-CM | POA: Diagnosis present

## 2022-02-03 DIAGNOSIS — R636 Underweight: Secondary | ICD-10-CM | POA: Diagnosis present

## 2022-02-03 DIAGNOSIS — G4700 Insomnia, unspecified: Secondary | ICD-10-CM | POA: Diagnosis present

## 2022-02-03 DIAGNOSIS — Z87440 Personal history of urinary (tract) infections: Secondary | ICD-10-CM

## 2022-02-03 DIAGNOSIS — F439 Reaction to severe stress, unspecified: Secondary | ICD-10-CM | POA: Diagnosis present

## 2022-02-03 LAB — RESP PANEL BY RT-PCR (FLU A&B, COVID) ARPGX2
Influenza A by PCR: NEGATIVE
Influenza B by PCR: NEGATIVE
SARS Coronavirus 2 by RT PCR: NEGATIVE

## 2022-02-03 LAB — RAPID URINE DRUG SCREEN, HOSP PERFORMED
Amphetamines: POSITIVE — AB
Barbiturates: NOT DETECTED
Benzodiazepines: NOT DETECTED
Cocaine: NOT DETECTED
Opiates: NOT DETECTED
Tetrahydrocannabinol: NOT DETECTED

## 2022-02-03 LAB — CBC WITH DIFFERENTIAL/PLATELET
Abs Immature Granulocytes: 0.05 10*3/uL (ref 0.00–0.07)
Basophils Absolute: 0 10*3/uL (ref 0.0–0.1)
Basophils Relative: 0 %
Eosinophils Absolute: 0 10*3/uL (ref 0.0–0.5)
Eosinophils Relative: 0 %
HCT: 41.4 % (ref 36.0–46.0)
Hemoglobin: 12.8 g/dL (ref 12.0–15.0)
Immature Granulocytes: 0 %
Lymphocytes Relative: 29 %
Lymphs Abs: 3.6 10*3/uL (ref 0.7–4.0)
MCH: 25.7 pg — ABNORMAL LOW (ref 26.0–34.0)
MCHC: 30.9 g/dL (ref 30.0–36.0)
MCV: 83.1 fL (ref 80.0–100.0)
Monocytes Absolute: 0.5 10*3/uL (ref 0.1–1.0)
Monocytes Relative: 4 %
Neutro Abs: 8.3 10*3/uL — ABNORMAL HIGH (ref 1.7–7.7)
Neutrophils Relative %: 67 %
Platelets: 339 10*3/uL (ref 150–400)
RBC: 4.98 MIL/uL (ref 3.87–5.11)
RDW: 13.2 % (ref 11.5–15.5)
WBC: 12.4 10*3/uL — ABNORMAL HIGH (ref 4.0–10.5)
nRBC: 0 % (ref 0.0–0.2)

## 2022-02-03 LAB — COMPREHENSIVE METABOLIC PANEL
ALT: 15 U/L (ref 0–44)
AST: 20 U/L (ref 15–41)
Albumin: 4.7 g/dL (ref 3.5–5.0)
Alkaline Phosphatase: 38 U/L (ref 38–126)
Anion gap: 9 (ref 5–15)
BUN: 8 mg/dL (ref 6–20)
CO2: 24 mmol/L (ref 22–32)
Calcium: 9.5 mg/dL (ref 8.9–10.3)
Chloride: 105 mmol/L (ref 98–111)
Creatinine, Ser: 0.73 mg/dL (ref 0.44–1.00)
GFR, Estimated: >60 mL/min (ref >60)
Glucose, Bld: 98 mg/dL (ref 70–99)
Potassium: 3.2 mmol/L — ABNORMAL LOW (ref 3.5–5.1)
Sodium: 138 mmol/L (ref 135–145)
Total Bilirubin: 0.9 mg/dL (ref 0.3–1.2)
Total Protein: 8.1 g/dL (ref 6.5–8.1)

## 2022-02-03 LAB — ACETAMINOPHEN LEVEL: Acetaminophen (Tylenol), Serum: <10 ug/mL — ABNORMAL LOW (ref 10–30)

## 2022-02-03 LAB — I-STAT BETA HCG BLOOD, ED (MC, WL, AP ONLY): I-stat hCG, quantitative: <5 m[IU]/mL (ref <5)

## 2022-02-03 LAB — ETHANOL: Alcohol, Ethyl (B): <10 mg/dL (ref <10)

## 2022-02-03 LAB — SALICYLATE LEVEL: Salicylate Lvl: <7 mg/dL — ABNORMAL LOW (ref 7.0–30.0)

## 2022-02-03 MED ORDER — ENSURE ENLIVE PO LIQD
237.0000 mL | Freq: Two times a day (BID) | ORAL | Status: DC
  Administered 2022-02-04 (×2): 237 mL via ORAL
  Filled 2022-02-03 (×5): qty 237

## 2022-02-03 MED ORDER — OLANZAPINE 5 MG PO TBDP
10.0000 mg | ORAL_TABLET | Freq: Every day | ORAL | Status: DC
  Administered 2022-02-03: 10 mg via ORAL
  Filled 2022-02-03: qty 2
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
## 2022-02-03 NOTE — ED Triage Notes (Signed)
The pt  is not saying that there is anything physically wrong with her  she has flight of ideas ?

## 2022-02-03 NOTE — ED Notes (Signed)
Pt given phone and connected w/niece  ?

## 2022-02-03 NOTE — ED Notes (Signed)
Pt is pleasant upon assessment. Appears to be coherent, not distracted or responding to internal stimuli. Denies hallucinations. When asked about blood work this RN explained her WBC elevation and pt is concerned about frequent UTI's. Pt able to understand education about alt BC and abx. She is calm and compliant  ?
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
## 2022-02-03 NOTE — Progress Notes (Signed)
Per [facility], pt has been accepted to [facility]. Bed number is 402-1. Attending provider will be Masseng[place], MD. Number for report is [phone]. The pt may arrive at 1500. Josem Kaufmann, RN at [facility] notified.  ? ? ? ? ?Ruthann Cancer MSW, LCSW ?Clincal Social Worker  ?[facility]  ? ?  ?

## 2022-02-03 NOTE — ED Notes (Signed)
Report called to [facility] RN 

## 2022-02-03 NOTE — Progress Notes (Addendum)
Patient is a 26 year old female who presented voluntarily from [facility] for AMS- delusional/disorganized behavior. Pt reportedly has no prior psych hx, and denies alcohol/ drug use, although pt tested positive for amphetamines. Pt presented with an anxious affect/ mood, was mildly disorganized, and due to her speaking softly she was hard to understand at times during admission interview. Pt denied SI/HI and A/VH, and did not appear to be thought blocking or responding to internal stimuli.  VS monitored and recorded. Skin check performed and belongings were searched and secured in locker. Admission paperwork completed and signed. Patient was oriented to unit and schedule. PO fluids provided. Safety maintained. Rest encouraged.   ?

## 2022-02-03 NOTE — Tx Team (Signed)
Initial Treatment Plan ?02/03/2022 ?7:08 PM ?Kaitlyn Crane ?[id number] ? ? ? ?PATIENT STRESSORS: ?Financial difficulties   ?Marital or family conflict   ?Other: mental health   ? ? ?PATIENT STRENGTHS: ?Capable of independent living  ?Communication skills  ?Motivation for treatment/growth  ?Physical Health  ?Supportive family/friends  ?Work skills  ? ? ?PATIENT IDENTIFIED PROBLEMS: ?AMS/ Delusional behavior  ?  ?"Finances"  ?  ?"Not being able to communicate well"  ?  ?  ?  ?  ?  ? ?DISCHARGE CRITERIA:  ?Improved stabilization in mood, thinking, and/or behavior ?Verbal commitment to aftercare and medication compliance ? ?PRELIMINARY DISCHARGE PLAN: ?Outpatient therapy ?Return to previous living arrangement ? ?PATIENT/FAMILY INVOLVEMENT: ?This treatment plan has been presented to and reviewed with the patient, Kaitlyn Crane,   The patient has been given the opportunity to ask questions and make suggestions. ? ?Shela Nevin, RN ?02/03/2022, 7:08 PM ?

## 2022-02-03 NOTE — ED Notes (Signed)
Pt calm, respirations even and unlabored
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
## 2022-02-03 NOTE — ED Notes (Signed)
Pt is vegetarian, diet changed  ?

## 2022-02-04 DIAGNOSIS — R102 Pelvic and perineal pain: Secondary | ICD-10-CM | POA: Insufficient documentation

## 2022-02-04 DIAGNOSIS — F29 Unspecified psychosis not due to a substance or known physiological condition: Secondary | ICD-10-CM | POA: Diagnosis present

## 2022-02-04 DIAGNOSIS — E876 Hypokalemia: Secondary | ICD-10-CM | POA: Diagnosis present

## 2022-02-04 DIAGNOSIS — Z681 Body mass index (BMI) 19 or less, adult: Secondary | ICD-10-CM

## 2022-02-04 DIAGNOSIS — R109 Unspecified abdominal pain: Secondary | ICD-10-CM | POA: Insufficient documentation

## 2022-02-04 LAB — URINALYSIS, COMPLETE (UACMP) WITH MICROSCOPIC
Bilirubin Urine: NEGATIVE
Glucose, UA: NEGATIVE mg/dL
Hgb urine dipstick: NEGATIVE
Ketones, ur: NEGATIVE mg/dL
Nitrite: POSITIVE — AB
Protein, ur: NEGATIVE mg/dL
Specific Gravity, Urine: 1.011 (ref 1.005–1.030)
WBC, UA: >50 WBC/hpf — ABNORMAL HIGH (ref 0–5)
pH: 6 (ref 5.0–8.0)

## 2022-02-04 MED ORDER — MELATONIN 3 MG PO TABS: 3.0000 mg | ORAL_TABLET | Freq: Every evening | ORAL | Status: DC | PRN

## 2022-02-04 MED ORDER — MAGNESIUM HYDROXIDE 400 MG/5ML PO SUSP: 30.0000 mL | Freq: Every day | ORAL | Status: DC | PRN

## 2022-02-04 MED ORDER — NITROFURANTOIN MONOHYD MACRO 100 MG PO CAPS
100.0000 mg | ORAL_CAPSULE | Freq: Two times a day (BID) | ORAL | Status: DC
  Administered 2022-02-04 – 2022-02-07 (×6): 100 mg via ORAL
  Filled 2022-02-04 (×11): qty 1

## 2022-02-04 MED ORDER — HYDROXYZINE HCL 25 MG PO TABS
25.0000 mg | ORAL_TABLET | ORAL | Status: DC | PRN
  Filled 2022-02-04: qty 1

## 2022-02-04 MED ORDER — ALUM & MAG HYDROXIDE-SIMETH 200-200-20 MG/5ML PO SUSP: 30.0000 mL | ORAL | Status: DC | PRN

## 2022-02-04 MED ORDER — ACETAMINOPHEN 325 MG PO TABS: 650.0000 mg | ORAL_TABLET | Freq: Four times a day (QID) | ORAL | Status: DC | PRN

## 2022-02-04 NOTE — Progress Notes (Signed)
D- Patient alert and oriented x4. Patient in stable mood.  Denies SI, HI, AVH. Pt asleep majority of shift.  ?  ?A- Routine safety checks conducted every 15 minutes.  Patient informed to notify staff with problems or concerns. ?  ?R-  Patient receptive, calm, and cooperative.   ? ? ? 02/03/22 1945  ?Psych Admission Type (Psych Patients Only)  ?Admission Status Voluntary  ?Psychosocial Assessment  ?Patient Complaints None  ?Eye Contact Fair  ?Facial Expression Flat  ?Affect Appropriate to circumstance  ?Speech Soft  ?Interaction Minimal  ?Motor Activity Slow  ?Appearance/Hygiene Disheveled  ?Behavior Characteristics Appropriate to situation  ?Mood Apprehensive  ?Thought Process  ?Coherency WDL  ?Content WDL  ?Delusions None reported or observed  ?Perception WDL  ?Hallucination None reported or observed  ?Judgment WDL  ?Confusion WDL  ? ? ?
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
## 2022-02-04 NOTE — Group Note (Signed)
[facility] LCSW Group Therapy Note ? ?02/04/2022   ? ?Type of Therapy and Topic:  Group Therapy:  Adding Supports Including Yourself ? ?Participation Level:  Did Not Attend  ? ?Description of Group:   Patients in this group were introduced to the concept that additional supports including self-support are an essential part of recovery.  Patients listed their current healthy and unhealthy supports, and discussed the difference between the two.   Several songs were played and a group discussion ensued in which patients stated they could relate to the songs which inspired them to realize they have be willing to help themselves in order to succeed, because other people cannot achieve sobriety or stability for them.  Parents were encouraged toward self-advocacy and self-support as part of their recovery.  They discussed their reactions to these songs' messages, which were positive and hopeful.  Before group ended, they identified the supports they believe they need to add to their lives to achieve their goals at discharge.  ? ?Therapeutic Goals: ?1)  explain the difference between healthy and unhealthy supports and discuss what specific supports are currently in patients' lives ?2)  demonstrate the importance of being a key part of one's own support system ?3)  discuss the need for appropriate boundaries with supports ?4)  elicit ideas from patients about supports that need to be added in order to achieve goals ?  ?Summary of Patient Progress:   The patient  was invited to group, did not attent. ? ?Therapeutic Modalities:   ?Motivational Interviewing ?Activity ? ?Lynnell Chad  ? ?   ?

## 2022-02-04 NOTE — Progress Notes (Signed)
D. Pt presented very depressed and guarded this am, remained in bed with covers over her head. Pt is refusing to talk with family, and is unwilling to give her family her code at this time. Pt currently denies pain, SI/HI and A/VH, stating, "I just want to sleep." Pt still has her breakfast sitting beside her untouched. Pt stated, "I'll eat later." It appears that pt has only taken sips from the bottle of water that was given to her last shift.  ?A. Labs and vitals monitored. Pt provided with po fluids and encouraged to drink. Pt supported emotionally and encouraged to express concerns and ask questions.   ?R. Pt remains safe with 15 minute checks. Will continue POC. ? ?  ?

## 2022-02-04 NOTE — BHH Group Notes (Signed)
Adult Psychoeducational Group  ?Date:  01/16/15/2023 ?Time:  1300-1400 ? ?Group Topic/Focus: Continuation of the group from Saturday. Looking at the lists that were created and talking about what needs to be done with the homework of 30 positives about themselves.  ?                                   Talking about taking their power back and helping themselves to develop a positive self esteem. ?     ?Participation Quality: did not attend ? ?Kaitlyn Crane A ? ? ?
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
## 2022-02-04 NOTE — BHH Group Notes (Signed)
Adult Psychoeducational Group Not ?Date:  02/04/2022 ?Time:  0900-1045 ?Group Topic/Focus: PROGRESSIVE RELAXATION. Crane group where deep breathing is taught and tensing and relaxation muscle groups is used. Imagery is used as well.  Pts are asked to imagine 3 pillars that hold them up when they are not able to hold themselves up and to share that with the group. ? ?Participation Level:  did not attend ? ?Kaitlyn Crane ? ?
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
## 2022-02-04 NOTE — BHH Counselor (Signed)
Clinical Social Work Note ? ?CSW entered pt's room and attempted to complete PSA, however pt stayed under the covers and requested CSW to come at another time so they could sleep. ? ?Steve Rattler, [place] ?02/04/2022 9:09 AM  ? ?

## 2022-02-05 ENCOUNTER — Encounter (HOSPITAL_COMMUNITY): Payer: Self-pay

## 2022-02-05 DIAGNOSIS — Z789 Other specified health status: Secondary | ICD-10-CM | POA: Diagnosis present

## 2022-02-05 DIAGNOSIS — F32A Depression, unspecified: Secondary | ICD-10-CM | POA: Diagnosis present

## 2022-02-05 DIAGNOSIS — E538 Deficiency of other specified B group vitamins: Secondary | ICD-10-CM | POA: Diagnosis present

## 2022-02-05 DIAGNOSIS — N390 Urinary tract infection, site not specified: Secondary | ICD-10-CM | POA: Diagnosis present

## 2022-02-05 LAB — MAGNESIUM: Magnesium: 2.7 mg/dL — ABNORMAL HIGH (ref 1.7–2.4)

## 2022-02-05 LAB — VITAMIN B12: Vitamin B-12: 172 pg/mL — ABNORMAL LOW (ref 180–914)

## 2022-02-05 LAB — FOLATE: Folate: 10.8 ng/mL (ref >5.9)

## 2022-02-05 LAB — TSH: TSH: 0.896 u[IU]/mL (ref 0.350–4.500)

## 2022-02-05 MED ORDER — CYANOCOBALAMIN 1000 MCG/ML IJ SOLN
1000.0000 ug | Freq: Once | INTRAMUSCULAR | Status: AC
  Administered 2022-02-05: 1000 ug via INTRAMUSCULAR
  Filled 2022-02-05: qty 1

## 2022-02-05 MED ORDER — RENA-VITE PO TABS
1.0000 | ORAL_TABLET | Freq: Every morning | ORAL | Status: DC
  Administered 2022-02-06: 1 via ORAL
  Filled 2022-02-05 (×3): qty 1

## 2022-02-05 MED ORDER — ADULT MULTIVITAMIN W/MINERALS CH
1.0000 | ORAL_TABLET | Freq: Every day | ORAL | Status: DC
  Administered 2022-02-05 – 2022-02-07 (×3): 1 via ORAL
  Filled 2022-02-05 (×5): qty 1

## 2022-02-05 MED ORDER — DOCUSATE SODIUM 100 MG PO CAPS: 100.0000 mg | ORAL_CAPSULE | Freq: Two times a day (BID) | ORAL | Status: DC | PRN

## 2022-02-05 MED ORDER — KATE FARMS STANDARD 1.4 PO LIQD
325.0000 mL | Freq: Two times a day (BID) | ORAL | Status: DC
  Administered 2022-02-05 – 2022-02-07 (×5): 325 mL via ORAL
  Filled 2022-02-05 (×9): qty 325

## 2022-02-05 NOTE — Group Note (Signed)
Recreation Therapy Group Note ? ? ?Group Topic:Stress Management  ?Group Date: 02/05/2022 ?Start Time: 0930 ?End Time: 0950 ?Facilitators: Caroll Rancher, LRT,CTRS ?Location: [address] Dayroom ? ? ?Goal Area(s) Addresses:  ?Patient will actively participate in stress management techniques presented during session.  ?Patient will successfully identify benefit of practicing stress management post d/c.  ? ?Group Description: Guided Imagery. LRT provided education, instruction, and demonstration on practice of visualization via guided imagery. Patient was asked to participate in the technique introduced during session. LRT debriefed including topics of mindfulness, stress management and specific scenarios each patient could use these techniques. Patients were given suggestions of ways to access scripts post d/c and encouraged to explore Youtube and other apps available on smartphones, tablets, and computers. ? ? ?Affect/Mood: N/A ?  ?Participation Level: Did not attend ?  ? ?Clinical Observations/Individualized Feedback:   ? ? ?Plan: Continue to engage patient in RT group sessions 2-3x/week. ? ? ?Caroll Rancher, LRT,CTRS  ?02/05/2022 11:41 AM ?

## 2022-02-05 NOTE — BHH Group Notes (Signed)
Spiritual care group on grief and loss facilitated by chaplain Katy Alisah Grandberry, BCC   Group Goal:   Support / Education around grief and loss   Members engage in facilitated group support and psycho-social education.   Group Description:   Following introductions and group rules, group members engaged in facilitated group dialog and support around topic of loss, with particular support around experiences of loss in their lives. Group Identified types of loss (relationships / self / things) and identified patterns, circumstances, and changes that precipitate losses. Reflected on thoughts / feelings around loss, normalized grief responses, and recognized variety in grief experience. Group noted Worden's four tasks of grief in discussion.   Group drew on Adlerian / Rogerian, narrative, MI,   Patient Progress: Did not attend.  

## 2022-02-05 NOTE — BHH Group Notes (Signed)
PT was informed but did not attend group. ?

## 2022-02-05 NOTE — Progress Notes (Signed)
Pt denies SI/HI/AVH and verbally agrees to approach staff if these become apparent or before harming themselves/others. Rates depression 0/10. Rates anxiety 0/10. Rates pain 0/10. Pt has been in the room but has gone down to meals and one group. Scheduled medications administered to pt, per MD orders. RN provided support and encouragement to pt. Q15 min safety checks implemented and continued. Pt safe on the unit. RN will continue to monitor and intervene as needed.  ? 02/05/22 1057  ?Psych Admission Type (Psych Patients Only)  ?Admission Status Voluntary  ?Psychosocial Assessment  ?Patient Complaints None  ?Eye Contact Fair  ?Facial Expression Flat;Sad  ?Affect Flat;Sad  ?Speech Soft;Logical/coherent  ?Interaction Forwards little;Guarded;Minimal;Isolative  ?Motor Activity Slow  ?Appearance/Hygiene Disheveled  ?Behavior Characteristics Cooperative;Appropriate to situation;Calm  ?Mood Sad;Pleasant  ?Thought Process  ?Coherency WDL  ?Content WDL  ?Delusions None reported or observed  ?Perception WDL  ?Hallucination None reported or observed  ?Judgment Impaired  ?Confusion None  ?Danger to Self  ?Current suicidal ideation? Denies  ?Danger to Others  ?Danger to Others None reported or observed  ? ? ?

## 2022-02-05 NOTE — BH IP Treatment Plan (Signed)
Interdisciplinary Treatment and Diagnostic Plan Update ? ?02/05/2022 ?Time of Session: 10:30am ?Kaitlyn Crane ?MRN: [id number] ? 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 ?Secondary Diagnoses: Principal Problem: ?  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  Hypokalemia ?  BMI less than 19,adult ?  Psychotic disorder ([place]) ?  UTI (urinary tract infection) ?  Vegan ?  B12 deficiency ? ? ?Current Medications:  ?Current Facility-Administered Medications  ?Medication Dose Route Frequency Provider Last Rate Last Admin  ? acetaminophen (TYLENOL) tablet 650 mg  650 mg Oral Q6H PRN Lindell Spar I, NP      ? alum & mag hydroxide-simeth (MAALOX/MYLANTA) 200-200-20 MG/5ML suspension 30 mL  30 mL Oral Q4H PRN Nwoko, Agnes I, NP      ? cyanocobalamin ((VITAMIN B-12)) injection 1,000 mcg  1,000 mcg Intramuscular Once Hill, Jackie Plum, MD      ? docusate sodium (COLACE) capsule 100 mg  100 mg Oral BID PRN Hill, Jackie Plum, MD      ? feeding supplement (KATE FARMS STANDARD 1.4) liquid 325 mL  325 mL Oral BID BM Hill, Jackie Plum, MD   325 mL at 02/05/22 1057  ? hydrOXYzine (ATARAX) tablet 25 mg  25 mg Oral Q4H PRN Lindell Spar I, NP      ? melatonin tablet 3 mg  3 mg Oral QHS PRN Encarnacion Slates, NP      ? Derrill Memo ON 02/06/2022] multivitamin (RENA-VIT) tablet 1 tablet  1 tablet Oral q AM Hill, Jackie Plum, MD      ? multivitamin with minerals tablet 1 tablet  1 tablet Oral Daily Hill, Jackie Plum, MD   1 tablet at 02/05/22 1057  ? nitrofurantoin (macrocrystal-monohydrate) (MACROBID) capsule 100 mg  100 mg Oral Q12H Lindell Spar I, NP   100 mg at 02/05/22 0817  ? ?PTA Medications: ?Medications Prior to Admission  ?Medication Sig Dispense Refill Last Dose  ? levonorgestrel (MIRENA, 52 MG,) 20 MCG/DAY IUD Mirena 20 mcg/24 hours (8 yrs) 52 mg intrauterine device ? Take 1 device by intrauterine route.     ? ? ?Patient Stressors: Financial difficulties   ?Marital or family conflict   ?Other: mental  health   ? ?Patient Strengths: Capable of independent living  ?Communication skills  ?Motivation for treatment/growth  ?Physical Health  ?Supportive family/friends  ?Work skills  ? ?Treatment Modalities: Medication Management, Group therapy, Case management,  ?1 to 1 session with clinician, Psychoeducation, Recreational therapy. ? ? 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 ?Short Term Goals: Ability to identify and develop effective coping behaviors will improve ?Compliance with prescribed medications will improve ?Ability to identify triggers associated with substance abuse/mental health issues will improve ?Ability to identify changes in lifestyle to reduce recurrence of condition will improve ?Ability to verbalize feelings will improve ?Ability to disclose and discuss suicidal ideas ?Ability to demonstrate self-control will improve ? ?Medication Management: Evaluate patient's response, side effects, and tolerance of medication regimen. ? ?Therapeutic Interventions: 1 to 1 sessions, Unit Group sessions and Medication administration. ? ?Evaluation of Outcomes: Not Met ? ?Physician Treatment Plan for Secondary Diagnosis: Principal Problem: ?  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  Hypokalemia ?  BMI less than 19,adult ?  Psychotic disorder ([place]) ?  UTI (urinary tract infection) ?  Vegan ?  B12 deficiency ? ?Long Term Goal(s): Improvement in symptoms so as ready for discharge  ? ?Short Term Goals: Ability to identify  and develop effective coping behaviors will improve ?Compliance with prescribed medications will improve ?Ability to identify triggers associated with substance abuse/mental health issues will improve ?Ability to identify changes in lifestyle to reduce recurrence of condition will improve ?Ability to verbalize feelings will improve ?Ability to disclose and discuss suicidal  ideas ?Ability to demonstrate self-control will improve    ? ?Medication Management: Evaluate patient's response, side effects, and tolerance of medication regimen. ? ?Therapeutic Interventions: 1 to 1 sessions, Unit Group sessions and Medication administration. ? ?Evaluation of Outcomes: Not Met ? ? 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 ?Short Term Goals: Ability to remain free from injury will improve, Ability to verbalize frustration and anger appropriately will improve, Ability to disclose and discuss suicidal ideas, Ability to identify and develop effective coping behaviors will improve, and Compliance with prescribed medications will improve ? ?Medication Management: RN will administer medications as ordered by provider, will assess and evaluate patient's response and provide education to patient for prescribed medication. RN will report any adverse and/or side effects to prescribing provider. ? ?Therapeutic Interventions: 1 on 1 counseling sessions, Psychoeducation, Medication administration, Evaluate responses to treatment, Monitor vital signs and CBGs as ordered, Perform/monitor CIWA, COWS, AIMS and Fall Risk screenings as ordered, Perform wound care treatments as ordered. ? ?Evaluation of Outcomes: Not Met ? ? 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 ? ?Short Term Goals: Engage patient in aftercare planning with referrals and resources, Increase social support, Increase ability to appropriately verbalize feelings, Facilitate patient progression through stages of change regarding substance use diagnoses and concerns, Identify triggers associated with mental health/substance abuse  issues, and Increase skills for wellness and recovery ? ?Therapeutic Interventions: Assess for all discharge needs, 1 to 1 time with Education officer, museum, Explore available resources and support systems, Assess for adequacy in community support network, Educate family and significant other(s) on suicide prevention, Complete Psychosocial Assessment, Interpersonal group therapy. ? ?Evaluation of Outcomes: Not Met ? ? ?Progress in Treatment: ?Attending groups: No. ?Participating in groups: No. ?Taking medication as prescribed: Yes. ?Toleration medication: Yes. ?Family/Significant other contact made: No, will contact:  declined consents ?Patient understands diagnosis: No. ?Discussing patient identified problems/goals with staff: Yes. ?Medical problems stabilized or resolved: Yes. ?Denies suicidal/homicidal ideation: Yes. ?Issues/concerns per patient self-inventory: No. ? ? ?New problem(s) identified: No, Describe:  none ? ?New Short Term/Long Term Goal(s): detox, medication management for mood stabilization; elimination of SI thoughts; development of comprehensive mental wellness/sobriety plan ? ?Patient Goals:  "Healthy mindset" ? ?Discharge Plan or Barriers: Patient recently admitted. CSW will continue to follow and assess for appropriate referrals and possible discharge planning.  ? ? ?Reason for Continuation of Hospitalization: Anxiety ?Delusions  ?Hallucinations ?Medication stabilization ? ?Estimated Length of Stay: 3-5 days ? ?Last 3 [place] Suicide Severity Risk Score: ?[facility] Admission (Current) from 02/03/2022 in [facility] ED from 02/02/2022 in [facility] from 06/01/2021 in [facility]  ?C-SSRS RISK CATEGORY No Risk No Risk No Risk  ? ?  ? ? ?Last PHQ 2/9 Scores: ? ?  11/16/2021  ?  1:25 PM 08/18/2021  ?  2:14 PM  ?Depression screen PHQ 2/9  ?Decreased Interest 0 0  ?Down, Depressed, Hopeless  0 0  ?PHQ  - 2 Score 0 0  ? ? ?Scribe for Treatment Team: ?Vassie Moselle, LCSW ?02/05/2022 ?11:43 AM ? ? ?

## 2022-02-05 NOTE — Group Note (Signed)
Date:  02/05/2022 ?Time:  11:56 AM ? ?Group Topic/Focus:  ?Orientation:   The focus of this group is to educate the patient on the purpose and policies of crisis stabilization and provide a format to answer questions about their admission.  The group details unit policies and expectations of patients while admitted. ? ? ? ?Participation Level:  Did Not Attend ? ?Participation Quality:   ? ?Affect:   ? ?Cognitive:   ? ?Insight:  ? ?Engagement in Group:   ? ?Modes of Intervention:   ? ?Additional Comments:   ? ?Jaquita Rector ?02/05/2022, 11:56 AM ? ?

## 2022-02-05 NOTE — Group Note (Signed)
LCSW Group Therapy Note ? ? ?Group Date: 02/05/2022 ?Start Time: 1300 ?End Time: 1400 ? ?Type of Therapy and Topic:  Group Therapy: Understanding the differences between fear and anxiety / Fear Ladder & Examining the Evidence ? ?Participation Level:  Active ? ? ?Description of Group:   ?In this group session, patients learned how to define and recognize the similarities differences between fear and anxiety. Patients will explore reactions we have to fear and anxiety. Patients identified a fear or something they feel anxious about. Patients analyzed scenarios and discussed both the positive and negative aspects to them. Patients were asked to identify if it is a fear or anxiety as well.  Patients were asked to provide their own examples. Patients will learn what to do for both fear and anxiety. CSW provided tools such as breaking things up into smaller steps, challenging automatic negative thinking / questions to ask, fear ladder and how to use gradual exposure. Patients will be asked to practice each tool with situations and to complete a fear ladder for their personal gain.  ? ?Therapeutic Goals: ?Patients will learn the difference between fear versus anxiety and the definitions of both. ?Patients will utilize scenarios and be able to identify if this is an example of a fear or anxiety. ?Patients will learn tools to handle both their fears and anxieties as well as discuss breaking it into smaller steps and exposure.  ?Patients will be asked to provide examples of their own and discuss how things have both positive and negative aspects.  ?Patients were provided questions to ask to challenge automatic negative thinking for anxiety.  ?Patients were provided a sample form of a fear ladder and asked to complete one for a fear.  ? ?Summary of Patient Progress:  Patient was engaged and participated in the group session. Patient shared their understanding of fear and anxiety   . Patient shared one fear and one anxiety.    ? ?Therapeutic Modalities:   ?Cognitive Behavioral Therapy ?Motivational Interviewing  ?Brief Therapy ? ?Aram Beecham, LCSWA ?02/05/2022  1:58 PM   ? ?

## 2022-02-05 NOTE — Group Note (Signed)
Occupational Therapy Group Note ? ?Group Topic:Communication  ?Group Date: 02/05/2022 ?Start Time: 1400 ?End Time: 1500 ?Facilitators: Ted Mcalpine, OT  ? ?Group Description: Group encouraged increased engagement and participation through discussion focused on communication styles. Patients were educated on the different styles of communication including passive, aggressive, assertive, and passive-aggressive communication. Group members shared and reflected on which styles they most often find themselves communicating in and brainstormed strategies on how to transition and practice a more assertive approach. Further discussion explored how to use assertiveness skills and strategies to further advocate and ask questions as it relates to their treatment plan and mental health.  ? ?Therapeutic Goal(s): ?Identify practical strategies to improve communication skills  ?Identify how to use assertive communication skills to address individual needs and wants ? ? ?Participation Level: Active ?  ?Participation Quality: Independent ?  ?Behavior: Appropriate, Calm, Cooperative, and Shy ?  ?Speech/Thought Process: Coherent, Focused, Organized, and Relevant ?  ?Affect/Mood: Appropriate and Stable  ?  ?Insight: Good ?  ?Judgement: Good ?  ?Individualization: Pt was active and engaged, however shy and somewhat reserved in their participation of group discussion/activity. Positive and effective communication skills identified  ?Modes of Intervention: Activity, Discussion, Education, Problem-solving, Role-play, and Support  ?Patient Response to Interventions:  Attentive, Engaged, Interested , and Receptive ?  ?Plan: Continue to engage patient in OT groups 2 - 3x/week. ? ?02/05/2022  ?Ted Mcalpine, OT ? ?Kerrin Champagne, OT ? ? ? ?

## 2022-02-05 NOTE — Progress Notes (Signed)
Patient is guarded and isolative to her room. Compliant with medications. Denies SI/HI/A/VH and verbally contracted for safety. ? ?Support and encouragement provided.  ?

## 2022-02-05 NOTE — Progress Notes (Signed)
[facility] MD Progress Note ? ?02/05/2022 10:54 AM ?Kaitlyn Crane  ?MRN:  [id number] ? ?History of Present Illness: This is the first psychiatric admission in this [facility] for this 26 year old [place] female. Admitted to the [facility] from the [facility] hospital ED with complaints of worsening paranoia, delusional thoughts & feeling stressed-out. She was brought to the [facility] ED by her family for mental health evaluation & possible treatment due to the above mentioned psychiatric symptoms. Patient's UDS was positive for amphetamine, however, denied & continued to maintain that she does not use drugs, did not use any drugs & would not use drugs ? ?24 hour EMR reviewed. Case reviewed in progression rounds.  Per nursing report: patient keeps to herself. She has been sleeping a lot. She has been to one group.   ? ?Subjective:  Di has been feeling tired. She is drinking the shakes and eating more. She feels that she is sleeping "too much". She has talked to her primary care about it and feels she was "brushed off". She denies restricting food, purging or using laxatives or diet pills. She had a decreased appetite prior to coming in because she was stressed and she is busy with her child. She is not having hallucinations. She doesn't want to talk about her beliefs about the phone because she is worried we will make her stay. She denies thoughts of harm to self or others. We talk about her lab results. She has tried to take some supplements in the past but has had problems with tolerance. She can't take anything with iron in it. She has some digestive issues it seems. Her son also has allergy issues. We discuss some ways to get nutrients that fit vegan lifestyle, like beet powder for iron, and using papaya enzyme for digestion/upset stomach.  ? ?Principal Problem: Substance-induced disorder ([facility]) ?Diagnosis: Principal Problem: ?  Substance-induced disorder ([facility]) ?Active Problems: ?  Hypokalemia ?  BMI less than 19,adult ?   Psychotic disorder ([facility]) ?  UTI (urinary tract infection) ?  Vegan ?  B12 deficiency ? ?Total Time spent with patient: 30 minutes ? ?Past Psychiatric History: denies ? ?Past Medical History:  ?Past Medical History:  ?Diagnosis Date  ? 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  ? No pertinent past medical history   ?  ?Past Surgical History:  ?Procedure Laterality Date  ? NO PAST SURGERIES    ? ?Family History:  ?Family History  ?Problem Relation Age of Onset  ? Alcohol abuse Neg Hx   ? ?Family Psychiatric  History: denies ?Social History:  ?Social History  ? ?Substance and Sexual Activity  ?Alcohol Use No  ?   ?Social History  ? ?Substance and Sexual Activity  ?Drug Use Not on file  ?  ?Social History  ? ?Socioeconomic History  ? Marital status: Single  ?  Spouse name: Not on file  ? Number of children: 0  ? Years of education: 93  ? Highest education level: Not on file  ?Occupational History  ? Occupation: STUDENT  ?Tobacco Use  ? Smoking status: Never  ? Smokeless tobacco: Never  ?Substance and Sexual Activity  ? Alcohol use: No  ? Drug use: Not on file  ? Sexual activity: Yes  ?  Partners: Male  ?  Birth control/protection: None  ?Other Topics Concern  ? Not on file  ?Social History Narrative  ? ** Merged History Encounter **  ?    ? ?Social Determinants of Health  ? ?  Financial Resource Strain: Not on file  ?Food Insecurity: Not on file  ?Transportation Needs: Not on file  ?Physical Activity: Not on file  ?Stress: Not on file  ?Social Connections: Not on file  ? ?Additional Social History:  ?  ?  ?  ?  ?  ?  ?  ?  ?  ?  ?  ? ?Sleep:  increased ? ?Appetite:  Fair ? ?Current Medications: ?Current Facility-Administered Medications  ?Medication Dose Route Frequency Provider Last Rate Last Admin  ? acetaminophen (TYLENOL) tablet 650 mg  650 mg Oral Q6H PRN Armandina StammerNwoko, Agnes I, NP      ? alum & mag hydroxide-simeth (MAALOX/MYLANTA) 200-200-20 MG/5ML suspension 30 mL  30 mL Oral Q4H PRN Nwoko, Agnes I, NP      ? cyanocobalamin ((VITAMIN  B-12)) injection 1,000 mcg  1,000 mcg Intramuscular Once Micai Apolinar, Shelbie HutchingStephanie Leigh, MD      ? docusate sodium (COLACE) capsule 100 mg  100 mg Oral BID PRN Makaya Juneau, Shelbie HutchingStephanie Leigh, MD      ? feeding supplement (KATE FARMS STANDARD 1.4) liquid 325 mL  325 mL Oral BID BM Cassy Sprowl, Shelbie HutchingStephanie Leigh, MD      ? hydrOXYzine (ATARAX) tablet 25 mg  25 mg Oral Q4H PRN Armandina StammerNwoko, Agnes I, NP      ? melatonin tablet 3 mg  3 mg Oral QHS PRN Sanjuana KavaNwoko, Agnes I, NP      ? Melene Muller[START ON 02/06/2022] multivitamin (RENA-VIT) tablet 1 tablet  1 tablet Oral q AM Annelie Boak, Shelbie HutchingStephanie Leigh, MD      ? multivitamin with minerals tablet 1 tablet  1 tablet Oral Daily Meeah Totino, Shelbie HutchingStephanie Leigh, MD      ? nitrofurantoin (macrocrystal-monohydrate) (MACROBID) capsule 100 mg  100 mg Oral Q12H Armandina StammerNwoko, Agnes I, NP   100 mg at 02/05/22 16100817  ? ? ?Lab Results:  ?Results for orders placed or performed during the hospital encounter of 02/03/22 (from the past 48 hour(s))  ?Urinalysis, Complete w Microscopic Urine, Random     Status: Abnormal  ? Collection Time: 02/04/22  5:46 PM  ?Result Value Ref Range  ? Color, Urine YELLOW YELLOW  ? APPearance CLOUDY (A) CLEAR  ? Specific Gravity, Urine 1.011 1.005 - 1.030  ? pH 6.0 5.0 - 8.0  ? Glucose, UA NEGATIVE NEGATIVE mg/dL  ? Hgb urine dipstick NEGATIVE NEGATIVE  ? Bilirubin Urine NEGATIVE NEGATIVE  ? Ketones, ur NEGATIVE NEGATIVE mg/dL  ? Protein, ur NEGATIVE NEGATIVE mg/dL  ? Nitrite POSITIVE (A) NEGATIVE  ? Leukocytes,Ua LARGE (A) NEGATIVE  ? RBC / HPF 0-5 0 - 5 RBC/hpf  ? WBC, UA >50 (H) 0 - 5 WBC/hpf  ? Bacteria, UA MANY (A) NONE SEEN  ? Squamous Epithelial / LPF 0-5 0 - 5  ? Mucus PRESENT   ?  Comment: Performed at [facility], [address] [place], [place] [address]  ?Vitamin B12     Status: Abnormal  ? Collection Time: 02/05/22  6:36 AM  ?Result Value Ref Range  ? Vitamin B-12 172 (L) 180 - 914 pg/mL  ?  Comment: (NOTE) ?This assay is not validated for testing neonatal or ?myeloproliferative syndrome specimens  for Vitamin B12 levels. ?Performed at [facility], [address] ?[place], [place] [address] ?  ?Magnesium     Status: Abnormal  ? Collection Time: 02/05/22  6:36 AM  ?Result Value Ref Range  ? Magnesium 2.7 (H) 1.7 - 2.4 mg/dL  ?  Comment: Performed at  [facility], [address] [place], [place] [address]  ?TSH     Status: None  ? Collection Time: 02/05/22  6:36 AM  ?Result Value Ref Range  ? TSH 0.896 0.350 - 4.500 uIU/mL  ?  Comment: Performed by a 3rd Generation assay with a functional sensitivity of <=0.01 uIU/mL. ?Performed at [facility], [address], [place], [place] [address] ?  ?Folate     Status: None  ? Collection Time: 02/05/22  6:36 AM  ?Result Value Ref Range  ? Folate 10.8 >5.9 ng/mL  ?  Comment: Performed at [facility], [address] [place], [place] [address]  ? ? ?Blood Alcohol level:  ?Lab Results  ?Component Value Date  ? ETH <10 02/03/2022  ? ? ?Metabolic Disorder Labs: ?No results found for: HGBA1C, MPG ?No results found for: PROLACTIN ?Lab Results  ?Component Value Date  ? CHOL 109 11/16/2021  ? TRIG 95.0 11/16/2021  ? HDL 35.30 (L) 11/16/2021  ? CHOLHDL 3 11/16/2021  ? VLDL 19.0 11/16/2021  ? LDLCALC 55 11/16/2021  ? ? ?Physical Findings: ?AIMS:  , ,  ,  ,    ?CIWA:    ?COWS:    ? ?Musculoskeletal: ?Strength & Muscle Tone: decreased ?Gait & Station: normal ?Patient leans: N/A ? ?Psychiatric Specialty Exam: ? ?Presentation  ?General Appearance: Casual; Fairly Groomed (Thin-framed.) ? ?Eye Contact:Fair ? ?Speech:Clear and Coherent; Normal Rate ? ?Speech Volume:Normal ? ?Handedness:Right ? ? ?Mood and Affect  ?Mood:-- (Patient denies any symptoms of depression/anxiety at this time.) ? ?Affect:Constricted ? ? ?Thought Process  ?Thought Processes:Coherent; Linear ? ?Descriptions of Associations:Intact ? ?Orientation:Full (Time, Place and Person) ? ?Thought Content:Logical ? ?History of  Schizophrenia/Schizoaffective disorder:-- (Patient denies.) ? ?Duration of Psychotic Symptoms:Less than six months ? ?Hallucinations:Hallucinations: None ? ?Ideas of Reference:Delusions; Paranoia ? ?Suicidal Thoughts:Suicidal Th

## 2022-02-05 NOTE — Progress Notes (Signed)
NUTRITION ASSESSMENT ? ?Pt identified as at risk on the Malnutrition Screen Tool ? ?INTERVENTION: ?1. Supplements: Ensure Plus High Protein po BID, each supplement provides 350 kcal and 20 grams of protein.  ?2. Kaitlyn Crane 1.4 PO BID, each provides 455 kcals and 20g protein ?3.Multivitamin with minerals daily ? ? ?NUTRITION DIAGNOSIS: ?Unintentional weight loss related to sub-optimal intake as evidenced by pt report.  ? ?Goal: ?Pt to meet >/= 90% of their estimated nutrition needs. ? ?Monitor:  ?PO intake ? ?Assessment:  ?Pt admitted for substance abuse. Pt follows a vegan diet. Accepting Ensure but will order vegan protein shake as well Kaitlyn Crane). Noted that MD is checking nutrition labs (Vit D. Thiamine, B-12, and folate). ?Will add daily MVI as well. ? ?Per weight records, pt has lost 11 lbs since 08/18/21 (10% wt loss x 5.5 months, significant for time frame).  ? ?Height: ?Ht Readings from Last 1 Encounters:  ?02/03/22 5' (1.524 m)  ? ? ?Weight: ?Wt Readings from Last 1 Encounters:  ?02/03/22 40.6 kg  ? ? ?Weight Hx: ?Wt Readings from Last 10 Encounters:  ?02/03/22 40.6 kg  ?02/03/22 43.1 kg  ?11/16/21 44.7 kg  ?08/18/21 45.9 kg  ?12/12/12 46.3 kg (6 %, Z= -1.55)*  ?10/28/12 49 kg (15 %, Z= -1.05)*  ?09/22/12 58.1 kg (56 %, Z= 0.15)*  ?09/16/12 58.1 kg (56 %, Z= 0.15)*  ?09/08/12 58.5 kg (58 %, Z= 0.20)*  ?08/25/12 56.2 kg (49 %, Z= -0.04)*  ? ?* Growth percentiles are based on CDC (Girls, 2-20 Years) data.  ? ? ?BMI:  Body mass index is 17.5 kg/m?Marland Kitchen ?Pt meets criteria for underweight based on current BMI. ? ?Estimated Nutritional Needs: ?Kcal: 25-30 kcal/kg ?Protein: > 1 gram protein/kg ?Fluid: 1 ml/kcal ? ?Diet Order:  ?Diet Order   ? ?       ?  Diet regular Room service appropriate? Yes; Fluid consistency: Thin  Diet effective now       ?  ? ?  ?  ? ?  ? ?Pt is also offered choice of unit snacks mid-morning and mid-afternoon.  ?Pt is eating as desired.  ? ?Lab results and medications reviewed.  ? ?Kaitlyn Franco, MS, RD, LDN ?Inpatient Clinical Dietitian ?Contact information available via Amion ? ? ?

## 2022-02-06 DIAGNOSIS — F1999 Other psychoactive substance use, unspecified with unspecified psychoactive substance-induced disorder: Secondary | ICD-10-CM

## 2022-02-06 MED ORDER — VITAMIN B-12 1000 MCG PO TABS
1000.0000 ug | ORAL_TABLET | Freq: Every day | ORAL | Status: DC
  Administered 2022-02-06 – 2022-02-07 (×2): 1000 ug via ORAL
  Filled 2022-02-06 (×5): qty 1

## 2022-02-06 NOTE — Progress Notes (Signed)
?   02/05/22 2200  ?Psych Admission Type (Psych Patients Only)  ?Admission Status Voluntary  ?Psychosocial Assessment  ?Patient Complaints None  ?Eye Contact Fair  ?Facial Expression Flat;Sad  ?Affect Depressed;Labile  ?Speech Soft  ?Interaction Guarded  ?Motor Activity Slow  ?Appearance/Hygiene Unremarkable  ?Behavior Characteristics Guarded  ?Mood Sad  ?Thought Process  ?Coherency WDL  ?Content WDL  ?Delusions None reported or observed  ?Perception WDL  ?Hallucination None reported or observed  ?Judgment Impaired  ?Confusion None  ?Danger to Self  ?Current suicidal ideation?  ?(DENIES)  ?Agreement Not to Harm Self Yes  ?Description of Agreement VERBAL  ? ? ?

## 2022-02-06 NOTE — Progress Notes (Signed)
Pt denies SI/HI/AVH and verbally agrees to approach staff if these become apparent or before harming themselves/others. Rates depression 0/10. Rates anxiety 0/10. Rates pain 0/10. Pt has a flat and depressed affect but denies it all. Pt is coming up to the window more and being in the dayroom more often. Scheduled medications administered to pt, per MD orders. RN provided support and encouragement to pt. Q15 min safety checks implemented and continued. Pt safe on the unit. RN will continue to monitor and intervene as needed.  ? 02/06/22 0748  ?Psych Admission Type (Psych Patients Only)  ?Admission Status Voluntary  ?Psychosocial Assessment  ?Patient Complaints None  ?Eye Contact Fair  ?Facial Expression Flat;Sad  ?Affect Depressed;Sad;Flat  ?Speech Soft;Logical/coherent  ?Interaction Forwards little;Guarded;Minimal  ?Motor Activity Slow  ?Appearance/Hygiene In scrubs;Disheveled  ?Behavior Characteristics Cooperative;Guarded;Calm  ?Mood Sad  ?Thought Process  ?Coherency WDL  ?Content WDL  ?Delusions None reported or observed  ?Perception WDL  ?Hallucination None reported or observed  ?Judgment Impaired  ?Confusion None  ?Danger to Self  ?Current suicidal ideation? Denies  ?Danger to Others  ?Danger to Others None reported or observed  ? ? ?

## 2022-02-06 NOTE — [facility] Group Notes (Signed)
Pt did not attend group. 

## 2022-02-06 NOTE — Progress Notes (Addendum)
[facility] ? ?02/06/2022 5:49 PM ?Kaitlyn Crane  ?MRN:  [id number] ? ?History of Present Illness: This is the [facility] for this 26 year old [place] female. [facility] with complaints of worsening paranoia, delusional thoughts & feeling stressed-out. She was [facility] by her family for mental health evaluation & possible treatment due to the above mentioned psychiatric symptoms. Patient's UDS was positive for amphetamine, however, denied & continued to maintain that she does not use drugs, did not use any drugs & would not use drugs ? ?Chart review from last 24 hours-The patient's chart was reviewed and nursing notes were reviewed. Patient discussed in progression rounds with treatment team. MAR was reviewed and Pt is complaint  with scheduled medications and did not require any PRN medications.  ?  ?Subjective: Patient was seen this morning.  Patient reports that her mood is "good".  She reports that she slept well last night and reports stable appetite.  When asked why is she here?Marland Kitchen.  She states that she got late in picking up her son from school on Friday and her sister picked him up.  When asked if she is paranoid about identity theft, she reports that she is not paranoid or delusional and everything is true.  She believes that her identity got stolen as she was missing lot of mail which was going to her previous address.  She reports that she saw weird transactions in her bank accounts and due to that she had to close multiple accounts.  She reports that she paid 2 of the credit cards and closed their account but her balance is still increasing. She reports that she has not talked to credit card company as this was recent.  She lives with her son and boyfriend. ? ?She reports that her niece know her well but she has not talked to her recently.  Encouraged her to talk to her niece so that we can call her  tomorrow for collateral. She verbalized understanding and she gave consent to talk to her niece [name](Nicole Cope - [phone]).  She reports that she stopped talking to providers and staff in the unit as she felt that everyone has different views.  When confronted her about amphetamine in urine, she states she does not know how amphetamine showed in her urine as she only drinks sleeping tea and Gummies.  She is not sure if her boyfriend is using drugs but reports that she has never tried drugs.  She denies any SI, HI, AVH.  She denies any special powers or magical thinking.  She denies any thought insertion, deletion, thought broadcasting, ideas of reference.  She denies any paranoia.  She reports that she is trying to resolve her identity theft problem.  Encouraged her to attend groups.  ? ?Principal Problem: Substance-induced disorder (HCC) ?Diagnosis: Principal Problem: ?  Substance-induced disorder (HCC) ?Active Problems: ?  Hypokalemia ?  BMI less than 19,adult ?  Psychotic disorder (HCC) ?  UTI (urinary tract infection) ?  Vegan ?  B12 deficiency ?  Depression ? ?Total Time spent with patient: 30 minutes ? ?Past Psychiatric History: denies ? ?Past Medical History:  ?Past Medical History:  ?Diagnosis Date  ? 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  ? No pertinent past medical history   ?  ?Past Surgical History:  ?Procedure Laterality Date  ? NO PAST SURGERIES    ? ?Family  History:  ?Family History  ?Problem Relation Age of Onset  ? Alcohol abuse Neg Hx   ? ?Family Psychiatric  History: denies ?Social History:  ?Social History  ? ?Substance and Sexual Activity  ?Alcohol Use No  ?   ?Social History  ? ?Substance and Sexual Activity  ?Drug Use Not on file  ?  ?Social History  ? ?Socioeconomic History  ? Marital status: Single  ?  Spouse name: Not on file  ? Number of children: 0  ? Years of education: 24  ? Highest education level: Not on file  ?Occupational History  ? Occupation: STUDENT  ?Tobacco Use  ? Smoking status: Never  ?  Smokeless tobacco: Never  ?Substance and Sexual Activity  ? Alcohol use: No  ? Drug use: Not on file  ? Sexual activity: Yes  ?  Partners: Male  ?  Birth control/protection: None  ?Other Topics Concern  ? Not on file  ?Social History Narrative  ? ** Merged History Encounter **  ?    ? ?Social Determinants of Health  ? ?Financial Resource Strain: Not on file  ?Food Insecurity: Not on file  ?Transportation Needs: Not on file  ?Physical Activity: Not on file  ?Stress: Not on file  ?Social Connections: Not on file  ? ?Additional Social History:  ?  ?  ?  ?  ?  ?  ?  ?  ?  ?  ?  ? ?Sleep: Good ? ?Appetite:  Fair ? ?Current Medications: ?Current Facility-Administered Medications  ?Medication Dose Route Frequency Provider Last Rate Last Admin  ? acetaminophen (TYLENOL) tablet 650 mg  650 mg Oral Q6H PRN Armandina Stammer I, NP      ? alum & mag hydroxide-simeth (MAALOX/MYLANTA) 200-200-20 MG/5ML suspension 30 mL  30 mL Oral Q4H PRN Nwoko, Agnes I, NP      ? docusate sodium (COLACE) capsule 100 mg  100 mg Oral BID PRN Hill, Shelbie Hutching, MD      ? feeding supplement (KATE FARMS STANDARD 1.4) liquid 325 mL  325 mL Oral BID BM Hill, Shelbie Hutching, MD   325 mL at 02/06/22 1442  ? hydrOXYzine (ATARAX) tablet 25 mg  25 mg Oral Q4H PRN Armandina Stammer I, NP      ? melatonin tablet 3 mg  3 mg Oral QHS PRN Nwoko, Agnes I, NP      ? multivitamin (RENA-VIT) tablet 1 tablet  1 tablet Oral q AM Hill, Shelbie Hutching, MD   1 tablet at 02/06/22 5366  ? multivitamin with minerals tablet 1 tablet  1 tablet Oral Daily Roselle Locus, MD   1 tablet at 02/06/22 0748  ? nitrofurantoin (macrocrystal-monohydrate) (MACROBID) capsule 100 mg  100 mg Oral Q12H Armandina Stammer I, NP   100 mg at 02/06/22 0748  ? vitamin B-12 (CYANOCOBALAMIN) tablet 1,000 mcg  1,000 mcg Oral Daily Comer Locket, MD      ? ? ?Lab Results:  ?Results for orders placed or performed during the hospital encounter of 02/03/22 (from the past 48 hour(s))  ?Vitamin B12      Status: Abnormal  ? Collection Time: 02/05/22  6:36 AM  ?Result Value Ref Range  ? Vitamin B-12 172 (L) 180 - 914 pg/mL  ?  Comment: (NOTE) ?This assay is not validated for testing neonatal or ?myeloproliferative syndrome specimens for Vitamin B12 levels. ?Performed at [facility], [address] ?[place], [place] [address] ?  ?Magnesium     Status:  Abnormal  ? Collection Time: 02/05/22  6:36 AM  ?Result Value Ref Range  ? Magnesium 2.7 (H) 1.7 - 2.4 mg/dL  ?  Comment: Performed at [facility], [address], [place], [place] [address]  ?TSH     Status: None  ? Collection Time: 02/05/22  6:36 AM  ?Result Value Ref Range  ? TSH 0.896 0.350 - 4.500 uIU/mL  ?  Comment: Performed by a 3rd Generation assay with a functional sensitivity of <=0.01 uIU/mL. ?Performed at [facility], [address] [place], [place] [address] ?  ?Folate     Status: None  ? Collection Time: 02/05/22  6:36 AM  ?Result Value Ref Range  ? Folate 10.8 >5.9 ng/mL  ?  Comment: Performed at [facility], [address] [place], [place] [address]  ? ? ?Blood Alcohol level:  ?Lab Results  ?Component Value Date  ? ETH <10 02/03/2022  ? ? ?Metabolic Disorder Labs: ?No results found for: HGBA1C, MPG ?No results found for: PROLACTIN ?Lab Results  ?Component Value Date  ? CHOL 109 11/16/2021  ? TRIG 95.0 11/16/2021  ? HDL 35.30 (L) 11/16/2021  ? CHOLHDL 3 11/16/2021  ? VLDL 19.0 11/16/2021  ? LDLCALC 55 11/16/2021  ? ? ?Physical Findings: ?AIMS: Facial and Oral Movements ?Muscles of Facial Expression: None, normal ?Lips and Perioral Area: None, normal ?Jaw: None, normal ?Tongue: None, normal,Extremity Movements ?Upper (arms, wrists, hands, fingers): None, normal ?Lower (legs, knees, ankles, toes): None, normal, Trunk Movements ?Neck, shoulders, hips: None, normal, Overall Severity ?Severity of abnormal movements (highest score from questions above): None,  normal ?Incapacitation due to abnormal movements: None, normal ?Patient's awareness of abnormal movements (rate only patient's report): No Awareness, Dental Status ?Current problems with teeth and/or dentures?: No ?Does pati

## 2022-02-06 NOTE — Group Note (Signed)
Recreation Therapy Group Note ? ? ?Group Topic:Animal Assisted Therapy   ?Group Date: 02/06/2022 ?Start Time: 1430 ?End Time: 1515 ?Facilitators: Caroll Rancher, LRT,CTRS ?Location: [address] Day[address] ? ? ?Animal-Assisted Activity (AAA) Program Checklist/Progress Note ?Patient Eligibility Criteria Checklist & Daily Group note for Rec Tx Intervention ? ?AAA/T Program Assumption of Risk Form signed by Patient/ or Parent Legal Guardian YES ? ?Patient is free of allergies or severe asthma  YES ? ?Patient reports no fear of animals YES ? ?Patient reports no history of cruelty to animals YES ? ?Patient understands their participation is voluntary YES ? ?Patient washes hands before animal contact YES ? ?Patient washes hands after animal contact YES ? ? ?Group Description: Patients provided opportunity to interact with trained and credentialed [employer] Therapy dog and the community volunteer/dog handler. Patients practiced appropriate animal interaction and were educated on dog safety outside of the hospital in common community settings. Patients were allowed to use dog toys and other items to practice commands, engage the dog in play, and/or complete routine aspects of animal care.  ? ?Education: Charity fundraiser, Health visitor, Communication & Social Skills  ? ? ?Affect/Mood: Appropriate ?  ?Participation Level: Engaged ?  ? ?Clinical Observations/Individualized Feedback: Pt attended and participated in group session.  ?  ? ?Plan: Continue to engage patient in RT group sessions 2-3x/week. ? ? ?Caroll Rancher, LRT,CTRS ?02/06/2022 4:26 PM ?

## 2022-02-07 LAB — HEAVY METALS, BLOOD
Arsenic: 1 ug/L (ref 0–9)
Lead: <1 ug/dL (ref 0.0–3.4)
Mercury: <1 ug/L (ref 0.0–14.9)

## 2022-02-07 LAB — VITAMIN D 25 HYDROXY (VIT D DEFICIENCY, FRACTURES)

## 2022-02-07 MED ORDER — NITROFURANTOIN MONOHYD MACRO 100 MG PO CAPS: 100.0000 mg | ORAL_CAPSULE | Freq: Two times a day (BID) | ORAL | 0 refills | Status: AC

## 2022-02-07 MED ORDER — CYANOCOBALAMIN 1000 MCG PO TABS: 1000.0000 ug | ORAL_TABLET | Freq: Every day | ORAL | Status: DC

## 2022-02-07 MED ORDER — ADULT MULTIVITAMIN W/MINERALS CH
1.0000 | ORAL_TABLET | Freq: Every day | ORAL | Status: DC
Start: 2022-02-08 — End: 2023-11-05

## 2022-02-07 NOTE — Progress Notes (Addendum)
[facility] MD Progress Note ? ?02/07/2022 8:41 AM ?Kaitlyn Crane  ?MRN:  [id number] ? ?History of Present Illness: This is the first psychiatric admission in this [facility] for this 26 year old [place] female. Ad[facility] to the [facility] ED with complaints of worsening paranoia, delusional thoughts & feeling stressed-out. She was brought to the [facility] ED by her family for mental health evaluation & possible treatment due to the above mentioned psychiatric symptoms. Patient's UDS was positive for amphetamine, however, denied & continued to maintain that she does not use drugs, did not use any drugs & would not use drugs ? ?Chart review from last 24 hours-The patient's chart was reviewed and nursing notes were reviewed. Patient discussed in progression rounds with treatment team. MAR was reviewed and Pt is complaint  with scheduled medications and did not require any PRN medications.  ?  ?Subjective: Patient was seen this morning.  Patient reports that her mood is "good".  She reports that she slept well last night and reports stable appetite.  When asked why is she here?Marland Kitchen  She states that she got late in picking up her son from school on Friday and her sister picked him up.  When asked if she is paranoid about identity theft, she reports that she is not paranoid or delusional and everything is true.  She believes that her identity got stolen as she was missing lot of mail which was going to her previous address.  She reports that she saw weird transactions in her bank accounts and due to that she had to close multiple accounts.  She reports that she paid 2 of the credit cards and closed their account but her balance is still increasing. She reports that she has not talked to credit card company as this was recent.  She lives with her son and boyfriend. ? ?She reports that her niece know her well but she has not talked to her recently.  Encouraged her to talk to her niece so that we can call her  tomorrow for collateral. She verbalized understanding and she gave consent to talk to her niece Kaitlyn Crane - [phone]).  She reports that she stopped talking to providers and staff in the unit as she felt that everyone has different views.  When confronted her about amphetamine in urine, she states she does not know how amphetamine showed in her urine as she only drinks sleeping tea and Gummies.  She is not sure if her boyfriend is using drugs but reports that she has never tried drugs.  She denies any SI, HI, AVH.  She denies any special powers or magical thinking.  She denies any thought insertion, deletion, thought broadcasting, ideas of reference.  She denies any paranoia.  She reports that she is trying to resolve her identity theft problem.  Encouraged her to attend groups.  ? ?Principal Problem: Substance-induced disorder (HCC) ?Diagnosis: Principal Problem: ?  Substance-induced disorder (HCC) ?Active Problems: ?  Hypokalemia ?  BMI less than 19,adult ?  Psychotic disorder (HCC) ?  UTI (urinary tract infection) ?  Vegan ?  B12 deficiency ?  Depression ? ?Total Time spent with patient: 30 minutes ? ?Past Psychiatric History: denies ? ?Past Medical History:  ?Past Medical History:  ?Diagnosis Date  ? 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  ? No pertinent past medical history   ?  ?Past Surgical History:  ?Procedure Laterality Date  ? NO PAST SURGERIES    ? ?Family  History:  ?Family History  ?Problem Relation Age of Onset  ? Alcohol abuse Neg Hx   ? ?Family Psychiatric  History: denies ?Social History:  ?Social History  ? ?Substance and Sexual Activity  ?Alcohol Use No  ?   ?Social History  ? ?Substance and Sexual Activity  ?Drug Use Not on file  ?  ?Social History  ? ?Socioeconomic History  ? Marital status: Single  ?  Spouse name: Not on file  ? Number of children: 0  ? Years of education: 78  ? Highest education level: Not on file  ?Occupational History  ? Occupation: STUDENT  ?Tobacco Use  ? Smoking status: Never  ?  Smokeless tobacco: Never  ?Substance and Sexual Activity  ? Alcohol use: No  ? Drug use: Not on file  ? Sexual activity: Yes  ?  Partners: Male  ?  Birth control/protection: None  ?Other Topics Concern  ? Not on file  ?Social History Narrative  ? ** Merged History Encounter **  ?    ? ?Social Determinants of Health  ? ?Financial Resource Strain: Not on file  ?Food Insecurity: Not on file  ?Transportation Needs: Not on file  ?Physical Activity: Not on file  ?Stress: Not on file  ?Social Connections: Not on file  ? ?Additional Social History:  ?  ?  ?  ?  ?  ?  ?  ?  ?  ?  ?  ? ?Sleep: Good ? ?Appetite:  Fair ? ?Current Medications: ?Current Facility-Administered Medications  ?Medication Dose Route Frequency Provider Last Rate Last Admin  ? acetaminophen (TYLENOL) tablet 650 mg  650 mg Oral Q6H PRN Armandina Stammer I, NP      ? alum & mag hydroxide-simeth (MAALOX/MYLANTA) 200-200-20 MG/5ML suspension 30 mL  30 mL Oral Q4H PRN Nwoko, Agnes I, NP      ? docusate sodium (COLACE) capsule 100 mg  100 mg Oral BID PRN Hill, Shelbie Hutching, MD      ? feeding supplement (KATE FARMS STANDARD 1.4) liquid 325 mL  325 mL Oral BID BM Hill, Shelbie Hutching, MD   325 mL at 02/06/22 1442  ? hydrOXYzine (ATARAX) tablet 25 mg  25 mg Oral Q4H PRN Armandina Stammer I, NP      ? melatonin tablet 3 mg  3 mg Oral QHS PRN Armandina Stammer I, NP      ? multivitamin with minerals tablet 1 tablet  1 tablet Oral Daily Hill, Shelbie Hutching, MD   1 tablet at 02/07/22 1027  ? nitrofurantoin (macrocrystal-monohydrate) (MACROBID) capsule 100 mg  100 mg Oral Q12H Armandina Stammer I, NP   100 mg at 02/07/22 2536  ? vitamin B-12 (CYANOCOBALAMIN) tablet 1,000 mcg  1,000 mcg Oral Daily Comer Locket, MD   1,000 mcg at 02/07/22 6440  ? ? ?Lab Results:  ?No results found for this or any previous visit (from the past 48 hour(s)). ? ? ?Blood Alcohol level:  ?Lab Results  ?Component Value Date  ? ETH <10 02/03/2022  ? ? ?Metabolic Disorder Labs: ?No results found for:  HGBA1C, MPG ?No results found for: PROLACTIN ?Lab Results  ?Component Value Date  ? CHOL 109 11/16/2021  ? TRIG 95.0 11/16/2021  ? HDL 35.30 (L) 11/16/2021  ? CHOLHDL 3 11/16/2021  ? VLDL 19.0 11/16/2021  ? LDLCALC 55 11/16/2021  ? ? ?Physical Findings: ?AIMS: Facial and Oral Movements ?Muscles of Facial Expression: None, normal ?Lips and Perioral Area: None, normal ?Jaw: None, normal ?Tongue: None,  normal,Extremity Movements ?Upper (arms, wrists, hands, fingers): None, normal ?Lower (legs, knees, ankles, toes): None, normal, Trunk Movements ?Neck, shoulders, hips: None, normal, Overall Severity ?Severity of abnormal movements (highest score from questions above): None, normal ?Incapacitation due to abnormal movements: None, normal ?Patient's awareness of abnormal movements (rate only patient's report): No Awareness, Dental Status ?Current problems with teeth and/or dentures?: No ?Does patient usually wear dentures?: No  ?CIWA:    ?COWS:    ? ?Musculoskeletal: ?Strength & Muscle Tone: decreased ?Gait & Station: normal ?Patient leans: N/A ? ?Psychiatric Specialty Exam: ? ?Presentation  ?General Appearance: Disheveled ? ?Eye Contact:Fleeting ? ?Speech:Clear and Coherent ? ?Speech Volume:Normal ? ?Handedness:Right ? ? ?Mood and Affect  ?Mood:Euthymic ? ?Affect:Restricted ? ? ?Thought Process  ?Thought Processes:Linear; Coherent ? ?Descriptions of Associations:Intact ? ?Orientation:Full (Time, Place and Person) ? ?Thought Content:Logical ? ?History of Schizophrenia/Schizoaffective disorder:No ? ?Duration of Psychotic Symptoms:Greater than six months ? ?Hallucinations:Hallucinations: None ? ?Ideas of Reference:None ? ?Suicidal Thoughts:Suicidal Thoughts: No ? ?Homicidal Thoughts:Homicidal Thoughts: No ? ? ?Sensorium  ?Memory:Immediate Fair; Recent Fair; Remote Fair ? ?Judgment:Fair ? ?Insight:Fair ? ? ?Executive Functions  ?Concentration:Good ? ?Attention Span:Good ? ?Recall:Fair ? ?Fund of  Knowledge:Fair ? ?Language:Good ? ? ?Psychomotor Activity  ?Psychomotor Activity:Psychomotor Activity: Decreased ? ? ?Assets  ?Assets:Desire for Improvement; Social Support; Housing ? ? ?Sleep  ?Sleep:Sleep: Good ?Number of Hours of Slee

## 2022-02-07 NOTE — Plan of Care (Signed)
?  Problem: Education: Goal: Knowledge of [facility] Education information/materials will improve Outcome: Adequate for Discharge Goal: Emotional status will improve Outcome: Adequate for Discharge Goal: Mental status will improve Outcome: Adequate for Discharge Goal: Verbalization of understanding the information provided will improve Outcome: Adequate for Discharge   Problem: Activity: Goal: Interest or engagement in activities will improve Outcome: Adequate for Discharge Goal: Sleeping patterns will improve Outcome: Adequate for Discharge   Problem: Coping: Goal: Ability to verbalize frustrations and anger appropriately will improve Outcome: Adequate for Discharge Goal: Ability to demonstrate self-control will improve Outcome: Adequate for Discharge   Problem: Health Behavior/Discharge Planning: Goal: Identification of resources available to assist in meeting health care needs will improve Outcome: Adequate for Discharge Goal: Compliance with treatment plan for underlying cause of condition will improve Outcome: Adequate for Discharge   Problem: Physical Regulation: Goal: Ability to maintain clinical measurements within normal limits will improve Outcome: Adequate for Discharge   Problem: Safety: Goal: Periods of time without injury will increase Outcome: Adequate for Discharge   Problem: Activity: Goal: Will verbalize the importance of balancing activity with adequate rest periods Outcome: Adequate for Discharge   Problem: Education: Goal: Will be free of psychotic symptoms Outcome: Adequate for Discharge Goal: Knowledge of the prescribed therapeutic regimen will improve Outcome: Adequate for Discharge   Problem: Coping: Goal: Coping ability will improve Outcome: Adequate for Discharge Goal: Will verbalize feelings Outcome: Adequate for Discharge   Problem: Health Behavior/Discharge Planning: Goal: Compliance with prescribed medication regimen will  improve Outcome: Adequate for Discharge   Problem: Nutritional: Goal: Ability to achieve adequate nutritional intake will improve Outcome: Adequate for Discharge   Problem: Role Relationship: Goal: Ability to communicate needs accurately will improve Outcome: Adequate for Discharge Goal: Ability to interact with others will improve Outcome: Adequate for Discharge   Problem: Safety: Goal: Ability to redirect hostility and anger into socially appropriate behaviors will improve Outcome: Adequate for Discharge Goal: Ability to remain free from injury will improve Outcome: Adequate for Discharge   Problem: Self-Care: Goal: Ability to participate in self-care as condition permits will improve Outcome: Adequate for Discharge   Problem: Self-Concept: Goal: Will verbalize positive feelings about self Outcome: Adequate for Discharge   Problem: Education: Goal: Ability to state activities that reduce stress will improve Outcome: Adequate for Discharge   Problem: Coping: Goal: Ability to identify and develop effective coping behavior will improve Outcome: Adequate for Discharge   Problem: Self-Concept: Goal: Ability to identify factors that promote anxiety will improve Outcome: Adequate for Discharge Goal: Level of anxiety will decrease Outcome: Adequate for Discharge Goal: Ability to modify response to factors that promote anxiety will improve Outcome: Adequate for Discharge   

## 2022-02-07 NOTE — Progress Notes (Signed)
?   02/07/22 0800  ?Psych Admission Type (Psych Patients Only)  ?Admission Status Voluntary  ?Psychosocial Assessment  ?Patient Complaints Anxiety;Nervousness;Restlessness;Suspiciousness  ?Eye Contact Fair  ?Facial Expression Worried  ?Affect Preoccupied  ?Speech Logical/coherent  ?Interaction Assertive  ?Motor Activity Pacing  ?Appearance/Hygiene Unremarkable  ?Behavior Characteristics Cooperative;Appropriate to situation;Anxious  ?Mood Anxious  ?Thought Process  ?Coherency WDL  ?Content Blaming others  ?Delusions None reported or observed  ?Perception WDL  ?Hallucination None reported or observed  ?Judgment Poor  ?Confusion None  ?Danger to Self  ?Current suicidal ideation? Denies  ?Agreement Not to Harm Self Yes  ?Description of Agreement Verbal  ?Danger to Others  ?Danger to Others None reported or observed  ? ? ?

## 2022-02-07 NOTE — Group Note (Signed)
Recreation Therapy Group Note ? ? ?Group Topic:Stress Management  ?Group Date: 02/07/2022 ?Start Time: 32 ?End Time: 0955 ?Facilitators: Caroll Rancher, LRT,CTRS ?Location: [address] Dayroom ? ? ?Goal Area(s) Addresses:  ?Patient will identify positive stress management techniques. ?Patient will identify benefits of using stress management post d/c. ? ?Group Description:  Meditation.  LRT played a meditation that focused on setting intention for the day.  It also focused on making the best use for your time and not focusing on things from the previous day but to focus on what you can in the now.  Patients were to listen and follow along as meditation played to fully engage.  Patients also learned they could find other stress management techniques from Youtube, apps and the Internet in general. ? ? ?Affect/Mood: Appropriate ?  ?Participation Level: Engaged ?  ?Participation Quality: Independent ?  ?Behavior: Appropriate ?  ?Speech/Thought Process: Focused ?  ?Insight: Good ?  ?Judgement: Good ?  ?Modes of Intervention: Meditation ?  ?Patient Response to Interventions:  Engaged ?  ?Education Outcome: ? Acknowledges education and In group clarification offered   ? ?Clinical Observations/Individualized Feedback: Pt listened and followed along with meditation as it played during group session.   ? ? ?Plan: Continue to engage patient in RT group sessions 2-3x/week. ? ? ?Caroll Rancher, LRT,CTRS ?02/07/2022 12:23 PM ?
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
## 2022-02-07 NOTE — Progress Notes (Signed)
Patient has been visible in the Milieu this shift with bright affect, preoccupied about discharge. Denies SI/HI/A/VH and verbally contracted for safety. Support and encouragement provided.  ?

## 2022-02-07 NOTE — Discharge Summary (Signed)
Physician Discharge Summary Note ? ?Patient:  Kaitlyn Crane is an 26 y.o., female ?MRN:  [id number] ?DOB:  Oct 27, 1995 ?Patient phone:  There is no home phone number on file.  ?Patient address:   ?[address] ?[place] [address],  ?Total Time spent with patient: 30 minutes ? ?Date of Admission:  02/03/2022 ?Date of Discharge: 02/07/22 ? ?Reason for Admission:  This is the first psychiatric admission in this [facility] for this 26 year old [place] female. Admitted to the [facility] from the [facility] hospital ED with complaints of worsening paranoia, delusional thoughts & feeling stressed-out.  ?HOSPITAL COURSE ? 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  Hypokalemia ?  BMI less than 19,adult ?  Psychotic disorder ([place]) ?  UTI (urinary tract infection) ?  Vegan ?  B12 deficiency ?  Depression ? ? ?Past Psychiatric History: denies ? ?Past Medical History:  ?Past Medical History:  ?Diagnosis Date  ? 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  ? No pertinent past medical history   ?  ?Past Surgical History:  ?Procedure Laterality Date  ? NO PAST SURGERIES    ? ?Family History:  ?Family History  ?Problem Relation Age of Onset  ? Alcohol abuse Neg Hx   ? ?Family Psychiatric  History: Patient denies any familial hx of mental illness. ?Social History:  ?Social History  ? ?Substance and Sexual Activity  ?Alcohol Use No  ?   ?Social History  ? ?Substance and Sexual Activity  ?Drug Use Not on file  ?  ?Social History  ? ?Socioeconomic History  ? Marital status: Single  ?  Spouse name: Not on file  ? Number of children: 0  ? Years of education: 11  ? Highest education level: Not on file  ?Occupational History  ? Occupation: STUDENT  ?Tobacco Use  ? Smoking status: Never  ? Smokeless tobacco: Never  ?Substance and Sexual Activity  ? Alcohol use: No  ? Drug use: Not on file  ? Sexual activity: Yes  ?  Partners: Male  ?  Birth control/protection: None   ?Other Topics Concern  ? Not on file  ?Social History Narrative  ? ** Merged History Encounter **  ?    ? ?Social Determinants of Health  ? ?Financial Resource Strain: Not on file  ?Food Insecurity: Not on file  ?Transportation Needs: Not on file  ?Physical Activity: Not on file  ?Stress: Not on file  ?Social Connections: Not on file  ? ? ?Hospital Course:  HOSPITAL COURSE: ?  ?During the patient's hospitalization, patient had extensive initial psychiatric evaluation, and follow-up psychiatric evaluations every day. ?  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  Hypokalemia ?  BMI less than 19,adult ?Diagnoses at discharge ?R/o Substance induced psychosis (UDS positive for amphetamine) ?R/o delusional d/o vs brief psychotic d/o ?R/o major depression with psychosis ?B12 deficiency ?UTI ?Hypokalemia ?Leukocytosis ?  ?Patient's psychiatric medications were adjusted on admission: ?Patient declined to be on any psychotropic medications at this time ?  ?-For medical problems, patient was started on Macrobid 100 mg p.o. twice daily for 7days for UTI, vitamin B-12 1000 mcg daily and MVI PO daily for vitamin B12 deficiency. ?-Heavy metal negative; RPR and HIV nonreactive in February 2023; TSH WNL, UDS positive for amphetamines; Head CT noncontrast negative ?During the hospitalization, other adjustments were made to the patient's psychiatric medication regimen: Patient continued to decline initiation of  any psychotropic medications. ?  ?Patient's care was discussed during the interdisciplinary team meeting every day during the hospitalization. ?  ?Gradually, patient started adjusting to milieu. The patient was evaluated each day by a clinical provider to ascertain response to treatment. Improvement was noted by the patient's report of decreasing symptoms, improved sleep and appetite, affect, medication tolerance, behavior, and participation in unit  programming.  Patient was asked each day to complete a self inventory noting mood, mental status, pain, new symptoms, anxiety and concerns.   ?Symptoms were reported as significantly decreased or resolved completely by discharge.  ?The patient reports that their mood is stable.  ?The patient denied having suicidal thoughts for more than 48 hours prior to discharge.  Patient denies having homicidal thoughts.  Patient denies having auditory hallucinations.  Patient denies any visual hallucinations or other symptoms of psychosis.  ?The patient was motivated to continue taking medication with a goal of continued improvement in mental health.  ?  ?The patient reports overall benefit other psychiatric hospitalization. Supportive psychotherapy was provided to the patient. The patient also participated in regular group therapy while hospitalized. Coping skills, problem solving as well as relaxation therapies were also part of the unit programming. ?  ?Labs were reviewed with the patient, and abnormal results were discussed with the patient. ?#Patient was recommended to follow-up with outpatient primary care doctor and other specialists -for management of chronic medical disease, including:  Preventative care, vitamin B12 deficiency, UTI, hypokalemia and leukocytosis.  ?  ?#Recommend following up with PCP for repeat CBC and BMP.  Patient reports that she will reach out to her PCP to repeat CBC and BMP this week. ?  ?The patient is able to verbalize their individual safety plan to this provider. ?  ?# It is recommended to the patient to continue psychiatric medications as prescribed, after discharge from the hospital.   ?  ?# It is recommended to the patient to follow up with your outpatient psychiatric provider and PCP. ?  ?# It was discussed with the patient, the impact of alcohol, drugs, tobacco have been there overall psychiatric and medical wellbeing, and total abstinence from substance use was recommended the  patient.ed. ?  ?# Prescriptions provided or sent directly to preferred pharmacy at discharge. Patient agreeable to plan. Given opportunity to ask questions. Appears to feel comfortable with discharge.  ?  ?# In the event of worsening symptoms, the patient is instructed to call the crisis hotline, 911 and or go to the nearest ED for appropriate evaluation and treatment of symptoms. To follow-up with primary care provider for other medical issues, concerns and or health care needs. ?  ?# Patient seen by this MD. At time of discharge, consistently refuted any suicidal ideation, intention or plan, denies any Self harm urges. Denies any A/VH and no delusions were elicited and does not seem to be responding to internal stimuli. During assessment the patient is able to verbalize appropriated coping skills and safety plan to use on return home. Patient verbalizes intent to be compliant with medication and outpatient services.  ?  ?#Called patient's sister Hhim @[phone] reports that patient is at her baseline and denies any safety concerns.  She reports that she talked to patient this morning and her speech and thought process are coherent.  She reports that patient does not have any access to guns.  Safety planning done with sister.  Sister will pick her up this afternoon. ?  ?# Patient was discharged home with a  plan to follow up as noted below.  ? ?Physical Findings: ?AIMS: Facial and Oral Movements ?Muscles of Facial Expression: None, normal ?Lips and Perioral Area: None, normal ?Jaw: None, normal ?Tongue: None, normal,Extremity Movements ?Upper (arms, wrists, hands, fingers): None, normal ?Lower (legs, knees, ankles, toes): None, normal, Trunk Movements ?Neck, shoulders, hips: None, normal, Overall Severity ?Severity of abnormal movements (highest score from questions above): None, normal ?Incapacitation due to abnormal movements: None, normal ?Patient's awareness of abnormal movements (rate only patient's  report): No Awareness, Dental Status ?Current problems with teeth and/or dentures?: No ?Does patient usually wear dentures?: No  ?CIWA:    ?COWS:    ? ?Musculoskeletal: ?Strength & Muscle Tone: within normal limits ?Gait

## 2022-02-07 NOTE — BHH Suicide Risk Assessment (Signed)
[facility] INPATIENT:  Family/Significant Other Suicide Prevention Education ? ?Suicide Prevention Education:  ?Education Completed; Hhim Simkins, [phone],  (name of family member/significant other) has been identified by the patient as the family member/significant other with whom the patient will be residing, and identified as the person(s) who will aid the patient in the event of a mental health crisis (suicidal ideations/suicide attempt).  With written consent from the patient, the family member/significant other has been provided the following suicide prevention education, prior to the and/or following the discharge of the patient. ? ?The suicide prevention education provided includes the following: ?Suicide risk factors ?Suicide prevention and interventions ?National Suicide Hotline telephone number ?[facility] assessment telephone number ?[facility] Emergency Assistance 911 ?[place] and/or Residential Mobile Crisis Unit telephone number ? ?Request made of family/significant other to: ?Remove weapons (e.g., guns, rifles, knives), all items previously/currently identified as safety concern.   ?Remove drugs/medications (over-the-counter, prescriptions, illicit drugs), all items previously/currently identified as a safety concern. ? ?The family member/significant other verbalizes understanding of the suicide prevention education information provided.  The family member/significant other agrees to remove the items of safety concern listed above. ? ?Kaitlyn Crane ?02/07/2022, 12:11 PM ?

## 2022-02-07 NOTE — Progress Notes (Signed)
Patient discharged. Patient received all personal belongings. Discharge instructions reviewed with patient. Patient verbalized understanding. Patient at discharge denies SI/HI/AVH. Patient left unit at 1257.  ?

## 2022-02-08 LAB — VITAMIN B1: Vitamin B1 (Thiamine): 53.9 nmol/L — ABNORMAL LOW (ref 66.5–200.0)

## 2022-02-13 ENCOUNTER — Ambulatory Visit (INDEPENDENT_AMBULATORY_CARE_PROVIDER_SITE_OTHER): Payer: Medicaid Other | Admitting: Family

## 2022-02-13 ENCOUNTER — Encounter: Payer: Self-pay | Admitting: Family

## 2022-02-13 VITALS — BP 110/60 | HR 102 | Temp 97.8°F | Ht 60.0 in | Wt 96.6 lb

## 2022-02-13 DIAGNOSIS — R636 Underweight: Secondary | ICD-10-CM | POA: Diagnosis not present

## 2022-02-13 DIAGNOSIS — N390 Urinary tract infection, site not specified: Secondary | ICD-10-CM

## 2022-02-13 DIAGNOSIS — E538 Deficiency of other specified B group vitamins: Secondary | ICD-10-CM | POA: Diagnosis not present

## 2022-02-13 DIAGNOSIS — R899 Unspecified abnormal finding in specimens from other organs, systems and tissues: Secondary | ICD-10-CM

## 2022-02-13 NOTE — Progress Notes (Signed)
?Kaitlyn Crane is a 26 y.o. female with the following history as recorded in EpicCare:  ?Patient Active Problem List  ? Diagnosis Date Noted  ? UTI (urinary tract infection) 02/05/2022  ? Vegan 02/05/2022  ? B12 deficiency 02/05/2022  ? Depression 02/05/2022  ? Abdominal pain 02/04/2022  ? Pain in pelvis 02/04/2022  ? Hypokalemia 02/04/2022  ? BMI less than 19,adult 02/04/2022  ? 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 Encounter for insertion of mirena IUD - inserted 12/12/12 12/12/2012  ? SVD (spontaneous vaginal delivery) 09/23/2012  ? Chorioamnionitis 09/23/2012  ? Late prenatal care 08/27/2012  ? Normal pregnancy, first 08/11/2012  ? Anemia 09/17/2011  ?  ?Current Outpatient Medications  ?Medication Sig Dispense Refill  ? levonorgestrel (MIRENA, 52 MG,) 20 MCG/DAY IUD Mirena 20 mcg/24 hours (8 yrs) 52 mg intrauterine device ? Take 1 device by intrauterine route.    ? Multiple Vitamin (MULTIVITAMIN WITH MINERALS) TABS tablet Take 1 tablet by mouth daily.    ? vitamin B-12 1000 MCG tablet Take 1 tablet (1,000 mcg total) by mouth daily.    ? ?No current facility-administered medications for this visit.  ?  ?Allergies: Patient has no known allergies.  ?Past Medical History:  ?Diagnosis Date  ? 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  ? No pertinent past medical history   ?  ?Past Surgical History:  ?Procedure Laterality Date  ? NO PAST SURGERIES    ?  ?Family History  ?Problem Relation Age of Onset  ? Alcohol abuse Neg Hx   ?  ?Social History  ? ?Tobacco Use  ? Smoking status: Never  ? Smokeless tobacco: Never  ?Substance Use Topics  ? Alcohol use: No  ?  ?Subjective:  ?Patient was hospitalized in [place] due to anxiety/ hallucinations; here for hospital follow up; Needs updated labs/ follow up on UTI;  ? ?Notes that feeling much better- working on taking care of herself and resting; trying to eat better; working on taking in more calories; planning to spend the summer in  [place] with family;  ? ?Scheduled to see gynecologist next week to discuss having IUD removed;  ? ? ?Objective:  ?Vitals:  ? 02/13/22 1456  ?BP: 110/60  ?Pulse: (!) 102  ?Temp: 97.8 ?F (36.6 ?C)  ?TempSrc: Oral  ?SpO2: 99%  ?Weight: 96 lb 9.6 oz (43.8 kg)  ?Height: 5' (1.524 m)  ?  ?General: Well developed, well nourished, in no acute distress  ?Skin : Warm and dry.  ?Head: Normocephalic and atraumatic  ?Lungs: Respirations unlabored; clear to auscultation bilaterally without wheeze, rales, rhonchi  ?CVS exam: normal rate and regular rhythm.  ?Neurologic: Alert and oriented; speech intact; face symmetrical; moves all extremities well; CNII-XII intact without focal deficit  ?Assessment:  ?1. Recurrent UTI   ?2. Abnormal laboratory test result   ?3. Low vitamin B12 level   ?4. Underweight   ?  ?Plan:  ?Update urine culture today; referral to urology per patient request;  ?Check CBC, CMP today; ?Check B12 level today; ?Referral to nutritionist;  ? ?Contact information given regarding psychiatrist/ psychologist that was scheduled by hospital; patient agrees to call and schedule as recommended.  ? ?Time spent with patient 30 minutes reviewing notes and discussing treatment plans ? ?No follow-ups on file.  ?Orders Placed This Encounter  ?Procedures  ? Urine Culture  ? CBC with Differential/Platelet  ? Comp Met (CMET)  ? B12  ? Ambulatory referral to Urology  ?  Referral  Priority:   Routine  ?  Referral Type:   Consultation  ?  Referral Reason:   Specialty Services Required  ?  Requested Specialty:   Urology  ?  Number of Visits Requested:   1  ? Amb ref to Medical Nutrition Therapy-MNT  ?  Referral Priority:   Routine  ?  Referral Type:   Consultation  ?  Referral Reason:   Specialty Services Required  ?  Requested Specialty:   Nutrition  ?  Number of Visits Requested:   1  ?  ?Requested Prescriptions  ? ? No prescriptions requested or ordered in this encounter  ?  ? ?

## 2022-02-14 ENCOUNTER — Telehealth: Payer: Self-pay | Admitting: Family

## 2022-02-14 ENCOUNTER — Other Ambulatory Visit: Payer: Self-pay | Admitting: Family

## 2022-02-14 DIAGNOSIS — J309 Allergic rhinitis, unspecified: Secondary | ICD-10-CM

## 2022-02-14 LAB — CBC WITH DIFFERENTIAL/PLATELET
Basophils Absolute: 0.1 10*3/uL (ref 0.0–0.1)
Basophils Relative: 0.7 % (ref 0.0–3.0)
Eosinophils Absolute: 0 10*3/uL (ref 0.0–0.7)
Eosinophils Relative: 0.4 % (ref 0.0–5.0)
HCT: 37.3 % (ref 36.0–46.0)
Hemoglobin: 12 g/dL (ref 12.0–15.0)
Lymphocytes Relative: 33.3 % (ref 12.0–46.0)
Lymphs Abs: 3.2 10*3/uL (ref 0.7–4.0)
MCHC: 32.1 g/dL (ref 30.0–36.0)
MCV: 81.8 fl (ref 78.0–100.0)
Monocytes Absolute: 0.5 10*3/uL (ref 0.1–1.0)
Monocytes Relative: 5 % (ref 3.0–12.0)
Neutro Abs: 5.9 10*3/uL (ref 1.4–7.7)
Neutrophils Relative %: 60.6 % (ref 43.0–77.0)
Platelets: 297 10*3/uL (ref 150.0–400.0)
RBC: 4.56 Mil/uL (ref 3.87–5.11)
RDW: 13.3 % (ref 11.5–15.5)
WBC: 9.6 10*3/uL (ref 4.0–10.5)

## 2022-02-14 LAB — COMPREHENSIVE METABOLIC PANEL
ALT: 14 U/L (ref 0–35)
AST: 16 U/L (ref 0–37)
Albumin: 4.6 g/dL (ref 3.5–5.2)
Alkaline Phosphatase: 34 U/L — ABNORMAL LOW (ref 39–117)
BUN: 19 mg/dL (ref 6–23)
CO2: 30 mEq/L (ref 19–32)
Calcium: 9.3 mg/dL (ref 8.4–10.5)
Chloride: 100 mEq/L (ref 96–112)
Creatinine, Ser: 0.67 mg/dL (ref 0.40–1.20)
GFR: 119.44 mL/min (ref >60.00)
Glucose, Bld: 69 mg/dL — ABNORMAL LOW (ref 70–99)
Potassium: 3.8 mEq/L (ref 3.5–5.1)
Sodium: 138 mEq/L (ref 135–145)
Total Bilirubin: 0.3 mg/dL (ref 0.2–1.2)
Total Protein: 7.6 g/dL (ref 6.0–8.3)

## 2022-02-14 LAB — URINE CULTURE
MICRO NUMBER:: 13340040
Result:: NO GROWTH
SPECIMEN QUALITY:: ADEQUATE

## 2022-02-14 LAB — VITAMIN B12: Vitamin B-12: 592 pg/mL (ref 211–911)

## 2022-02-14 NOTE — Telephone Encounter (Signed)
Left detailed message on machine that rx has been sent in. 

## 2022-02-14 NOTE — Telephone Encounter (Signed)
Patient states she forgot to ask for a allergy referral on her 05/02 visit. She would like for one to be sent. Please advise.  ?

## 2022-03-20 DIAGNOSIS — F331 Major depressive disorder, recurrent, moderate: Secondary | ICD-10-CM | POA: Diagnosis not present

## 2022-05-01 ENCOUNTER — Ambulatory Visit: Payer: Medicaid Other | Admitting: Family

## 2022-05-03 ENCOUNTER — Ambulatory Visit: Payer: Medicaid Other | Admitting: Family

## 2022-05-04 ENCOUNTER — Ambulatory Visit (INDEPENDENT_AMBULATORY_CARE_PROVIDER_SITE_OTHER): Payer: Medicaid Other | Admitting: Family

## 2022-05-04 VITALS — BP 90/62 | HR 87 | Temp 97.9°F | Resp 18 | Ht 60.0 in | Wt 100.2 lb

## 2022-05-04 DIAGNOSIS — E559 Vitamin D deficiency, unspecified: Secondary | ICD-10-CM | POA: Diagnosis not present

## 2022-05-04 DIAGNOSIS — J0391 Acute recurrent tonsillitis, unspecified: Secondary | ICD-10-CM

## 2022-05-04 MED ORDER — AMOXICILLIN 875 MG PO TABS: 875.0000 mg | ORAL_TABLET | Freq: Two times a day (BID) | ORAL | 0 refills | Status: AC

## 2022-05-04 NOTE — Progress Notes (Signed)
Kaitlyn Crane is a 26 y.o. female with the following history as recorded in EpicCare:  Patient Active Problem List   Diagnosis Date Noted   UTI (urinary tract infection) 02/05/2022   Vegan 02/05/2022   B12 deficiency 02/05/2022   Depression 02/05/2022   Abdominal pain 02/04/2022   Pain in pelvis 02/04/2022   Hypokalemia 02/04/2022   BMI less than 19,adult 02/04/2022   Psychotic disorder ([facility]) 02/04/2022   Substance-induced disorder ([facility]) 02/03/2022   Encounter for insertion of mirena IUD - inserted 12/12/12 12/12/2012   SVD (spontaneous vaginal delivery) 09/23/2012   Chorioamnionitis 09/23/2012   Late prenatal care 08/27/2012   Normal pregnancy, first 08/11/2012   Anemia 09/17/2011    Current Outpatient Medications  Medication Sig Dispense Refill   amoxicillin (AMOXIL) 875 MG tablet Take 1 tablet (875 mg total) by mouth 2 (two) times daily for 10 days. 20 tablet 0   levonorgestrel (MIRENA, 52 MG,) 20 MCG/DAY IUD Mirena 20 mcg/24 hours (8 yrs) 52 mg intrauterine device  Take 1 device by intrauterine route.     Multiple Vitamin (MULTIVITAMIN WITH MINERALS) TABS tablet Take 1 tablet by mouth daily.     vitamin B-12 1000 MCG tablet Take 1 tablet (1,000 mcg total) by mouth daily.     No current facility-administered medications for this visit.    Allergies: Patient has no known allergies.  Past Medical History:  Diagnosis Date   Infection 2012   UTERUS ?   No pertinent past medical history     Past Surgical History:  Procedure Laterality Date   NO PAST SURGERIES      Family History  Problem Relation Age of Onset   Alcohol abuse Neg Hx     Social History   Tobacco Use   Smoking status: Never   Smokeless tobacco: Never  Substance Use Topics   Alcohol use: No    Subjective:   Patient presents with concerns for 2 week history of swollen tonsils. Notes that prone to recurrent tonsil infections; denies any difficulty breathing; would like to see ENT to discuss treatment  options for her tonsils;  Also requesting to have her Vitamin D level re-checked today; was noted to be very low when checked earlier this year;      Objective:  Vitals:   05/04/22 1414  BP: 90/62  Pulse: 87  Resp: 18  Temp: 97.9 F (36.6 C)  TempSrc: Temporal  SpO2: 99%  Weight: 100 lb 3.2 oz (45.5 kg)  Height: 5' (1.524 m)    General: Well developed, well nourished, in no acute distress  Skin : Warm and dry.  Head: Normocephalic and atraumatic  Eyes: Sclera and conjunctiva clear; pupils round and reactive to light; extraocular movements intact  Ears: External normal; canals clear; tympanic membranes normal  Oropharynx: Pink, supple. Enlarged tonsils bilaterally Neck: Supple without thyromegaly, adenopathy  Lungs: Respirations unlabored;  Neurologic: Alert and oriented; speech intact; face symmetrical; moves all extremities well; CNII-XII intact without focal deficit   Assessment:  1. Recurrent tonsillitis   2. Vitamin D deficiency     Plan:  Will treat for infection based on appearance; Rx for Amoxicillin 875 mg bid x 10 days; will refer to ENT per patient request to see if she is surgical candidate to have removed; Check Vitamin D level today; follow up to be determined.   No follow-ups on file.  Orders Placed This Encounter  Procedures   Vitamin D (25 hydroxy)   Ambulatory referral to ENT  Referral Priority:   Routine    Referral Type:   Consultation    Referral Reason:   Specialty Services Required    Requested Specialty:   Otolaryngology    Number of Visits Requested:   1    Requested Prescriptions   Signed Prescriptions Disp Refills   amoxicillin (AMOXIL) 875 MG tablet 20 tablet 0    Sig: Take 1 tablet (875 mg total) by mouth 2 (two) times daily for 10 days.

## 2022-05-05 LAB — VITAMIN D 25 HYDROXY (VIT D DEFICIENCY, FRACTURES): Vit D, 25-Hydroxy: 18 ng/mL — ABNORMAL LOW (ref 30–100)

## 2022-05-07 ENCOUNTER — Telehealth: Payer: Self-pay | Admitting: Family

## 2022-05-07 ENCOUNTER — Other Ambulatory Visit: Payer: Self-pay | Admitting: Family

## 2022-05-07 MED ORDER — VITAMIN D (ERGOCALCIFEROL) 1.25 MG (50000 UNIT) PO CAPS: 50000.0000 [IU] | ORAL_CAPSULE | ORAL | 0 refills | Status: AC

## 2022-05-07 NOTE — Telephone Encounter (Signed)
[facility] called to let us know that they received a referral for the patient but the diagnosis they have is for recurrent tonsillitis which is not something they treat. They can treat patient for allergy related diagnosis but they cant do an ENT referral. Please advise.

## 2022-05-09 DIAGNOSIS — N3021 Other chronic cystitis with hematuria: Secondary | ICD-10-CM | POA: Diagnosis not present

## 2022-05-09 DIAGNOSIS — R3121 Asymptomatic microscopic hematuria: Secondary | ICD-10-CM | POA: Diagnosis not present

## 2022-05-15 ENCOUNTER — Encounter: Payer: Self-pay | Admitting: Family

## 2022-05-15 ENCOUNTER — Ambulatory Visit (INDEPENDENT_AMBULATORY_CARE_PROVIDER_SITE_OTHER): Payer: Medicaid Other | Admitting: Family

## 2022-05-15 ENCOUNTER — Ambulatory Visit (HOSPITAL_BASED_OUTPATIENT_CLINIC_OR_DEPARTMENT_OTHER)
Admission: RE | Admit: 2022-05-15 | Discharge: 2022-05-15 | Disposition: A | Discharge status: Home / Self Care | Payer: Medicaid Other | Source: Ambulatory Visit | Attending: Family | Admitting: Family

## 2022-05-15 VITALS — BP 96/60 | HR 70 | Resp 20 | Ht 60.0 in | Wt 102.0 lb

## 2022-05-15 DIAGNOSIS — G8929 Other chronic pain: Secondary | ICD-10-CM | POA: Diagnosis not present

## 2022-05-15 DIAGNOSIS — L709 Acne, unspecified: Secondary | ICD-10-CM

## 2022-05-15 DIAGNOSIS — M5441 Lumbago with sciatica, right side: Secondary | ICD-10-CM

## 2022-05-15 DIAGNOSIS — M545 Low back pain, unspecified: Secondary | ICD-10-CM | POA: Diagnosis not present

## 2022-05-15 NOTE — Progress Notes (Signed)
Kaitlyn Crane is a 26 y.o. female with the following history as recorded in EpicCare:  Patient Active Problem List   Diagnosis Date Noted   UTI (urinary tract infection) 02/05/2022   Vegan 02/05/2022   B12 deficiency 02/05/2022   Depression 02/05/2022   Abdominal pain 02/04/2022   Pain in pelvis 02/04/2022   Hypokalemia 02/04/2022   BMI less than 19,adult 02/04/2022   Psychotic disorder ([facility]) 02/04/2022   Substance-induced disorder ([facility]) 02/03/2022   Encounter for insertion of mirena IUD - inserted 12/12/12 12/12/2012   SVD (spontaneous vaginal delivery) 09/23/2012   Chorioamnionitis 09/23/2012   Late prenatal care 08/27/2012   Normal pregnancy, first 08/11/2012   Anemia 09/17/2011    Current Outpatient Medications  Medication Sig Dispense Refill   levonorgestrel (MIRENA, 52 MG,) 20 MCG/DAY IUD Mirena 20 mcg/24 hours (8 yrs) 52 mg intrauterine device  Take 1 device by intrauterine route.     Multiple Vitamin (MULTIVITAMIN WITH MINERALS) TABS tablet Take 1 tablet by mouth daily.     nitrofurantoin (MACRODANTIN) 50 MG capsule Take 50 mg by mouth daily.     vitamin B-12 1000 MCG tablet Take 1 tablet (1,000 mcg total) by mouth daily.     Vitamin D, Ergocalciferol, (DRISDOL) 1.25 MG (50000 UNIT) CAPS capsule Take 1 capsule (50,000 Units total) by mouth every 7 (seven) days for 12 doses. 12 capsule 0   No current facility-administered medications for this visit.    Allergies: Patient has no known allergies.  Past Medical History:  Diagnosis Date   Infection 2012   UTERUS ?   No pertinent past medical history     Past Surgical History:  Procedure Laterality Date   NO PAST SURGERIES      Family History  Problem Relation Age of Onset   Alcohol abuse Neg Hx     Social History   Tobacco Use   Smoking status: Never   Smokeless tobacco: Never  Substance Use Topics   Alcohol use: No    Subjective:   Patient presents today requesting referral to dermatology discuss concerns  regarding acne; She also mentions problems with her right leg that has been present "for years"- has pain in her leg when standing for extended periods of time or driving long distances; does have some lower back pain; no known injury or trauma;     Objective:  Vitals:   05/15/22 0856  BP: 96/60  Pulse: 70  Resp: 20  SpO2: 99%  Weight: 102 lb (46.3 kg)  Height: 5' (1.524 m)    General: Well developed, well nourished, in no acute distress  Skin : Warm and dry.  Head: Normocephalic and atraumatic  Lungs: Respirations unlabored; clear to auscultation bilaterally without wheeze, rales, rhonchi  CVS exam: normal rate and regular rhythm.  Neurologic: Alert and oriented; speech intact; face symmetrical; moves all extremities well; CNII-XII intact without focal deficit   Assessment:  1. Acne, unspecified acne type   2. Chronic right-sided low back pain with right-sided sciatica     Plan:  Refer to dermatology per patient request; Will update lumbar X-ray; to consider referral for PT; follow up to be determined.   No follow-ups on file.  Orders Placed This Encounter  Procedures   DG Lumbar Spine Complete    Standing Status:   Future    Number of Occurrences:   1    Standing Expiration Date:   05/16/2023    Order Specific Question:   Reason for Exam (SYMPTOM  OR  DIAGNOSIS REQUIRED)    Answer:   low back pain    Order Specific Question:   Is patient pregnant?    Answer:   No    Order Specific Question:   Preferred imaging location?    Answer:   [facility]   Ambulatory referral to Dermatology    Referral Priority:   Routine    Referral Type:   Consultation    Referral Reason:   Specialty Services Required    Requested Specialty:   Dermatology    Number of Visits Requested:   1    Requested Prescriptions    No prescriptions requested or ordered in this encounter

## 2022-05-17 ENCOUNTER — Encounter: Payer: Self-pay | Admitting: Family

## 2022-05-17 ENCOUNTER — Other Ambulatory Visit: Payer: Self-pay | Admitting: Family

## 2022-05-17 DIAGNOSIS — M79604 Pain in right leg: Secondary | ICD-10-CM

## 2022-05-22 ENCOUNTER — Other Ambulatory Visit: Payer: Self-pay | Admitting: Obstetrics and Gynecology

## 2022-05-23 DIAGNOSIS — R87612 Low grade squamous intraepithelial lesion on cytologic smear of cervix (LGSIL): Secondary | ICD-10-CM | POA: Diagnosis not present

## 2022-05-23 DIAGNOSIS — Z30432 Encounter for removal of intrauterine contraceptive device: Secondary | ICD-10-CM | POA: Diagnosis not present

## 2022-05-24 ENCOUNTER — Other Ambulatory Visit: Payer: Self-pay | Admitting: Family

## 2022-05-25 ENCOUNTER — Other Ambulatory Visit: Payer: Self-pay | Admitting: *Deleted

## 2022-05-25 MED ORDER — MUPIROCIN 2 % EX OINT: 1.0000 | TOPICAL_OINTMENT | Freq: Two times a day (BID) | CUTANEOUS | 0 refills | Status: DC | PRN

## 2022-05-29 ENCOUNTER — Encounter: Payer: Self-pay | Admitting: Registered"

## 2022-05-29 ENCOUNTER — Encounter: Payer: Medicaid Other | Attending: Family | Admitting: Registered"

## 2022-05-29 DIAGNOSIS — Z713 Dietary counseling and surveillance: Secondary | ICD-10-CM | POA: Diagnosis present

## 2022-05-29 NOTE — Progress Notes (Signed)
Appointment start time: 9:24  Appointment end time: 10:44  Patient was seen on 05/29/2022 for nutrition counseling pertaining to disordered eating  Primary care provider: Ria Clock, FNP Therapist: [facility]; will see today for the first time  ROI: N/A Any other medical team members: none   Assessment  Reports she was diagnosed with depression in 01/2022. State she was vegan for 3 years but she is on longer vegan but wants to return to this lifestyle. States she wants to add meat and increase weight. States her weight was 90 lbs and its back to 102 lbs. States the most she has weighed was 115 lbs. States she weighed 105 lbs when vegan. Reports her family (parents and siblings) were concerned about vegan lifestyle and wanted her to eat meat. States she decided to switch to vegan lifestyle after watching nutrition documentaries. Reports history of being called fat by ex-mother-in-law when she weighed 115 lbs.  Reports challenges with sleep; she is waking up prior to alarm going off in the mornings. States she was living with boyfriend and son in and then moved to another state, which didn't go well for them. States they argued a lot and she would intentionally starve herself. States she was dealing with financial stress. In 07/2021 - moved in cousin and got sick related to depression.  States she used to eat 1 meal day. 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 States she feels hungry and will not eat, wakes up hungry at times and will not eat. States she cooks with grape seed or avocado oil.   States culturally, people are to eat a certain diet for 4 weeks after giving birth. States she did not follow this and also experienced postpartum depression.   States she is looking for a new PCP. States she is seeing an Proofreader and will see them at the end of August.   Lives with parents and 53 year old son. Receives EBT benefits $516/month and states  this is not enough to provide for them.   Growth Metrics: Goal rate of weight gain:  0.5-1.0 lb/week  Eating history: Length of time: 1 years Previous treatments: none Goals for RD meetings: improve hair shedding, dizziness/lightheadedness, headaches, focus/concentration, cold intolerance   Weight history:  Highest weight: 115; while pregnant 129   Lowest weight: 90 Most consistent weight: 104-106 What would you like to weigh: no How has weight changed in the past year: weight loss  Medical Information:  Changes in hair, skin, nails since ED started: continued hair shedding, yellowish skin, brittle nails, easily bruised, easily bleeds Chewing/swallowing difficulties: yes, tonsils will give trouble with swallowing Reflux or heartburn: no Trouble with teeth: has 6 cavities, has trouble with grinding teeth since childhood LMP without the use of hormones: 8/9  Weight at that point:  Effect of exercise on menses: none reported   Effect of hormones on menses: N/A; just had Mirena removed on 8/9, has had it for 9 years Constipation, diarrhea: has BM 3-4x/week Dizziness/lightheadedness: yes, a few times throughout the week when getting up too quickly Headaches/body aches: yes, sometimes; legs going to sleep easily Heart racing/chest pain: yes, recently since 07/2021 Mood: moody Sleep: 6 hrs/night; does not feel rested Focus/concentration: yes Cold intolerance: yes Vision changes: sometimes, blurred vision  Mental health diagnosis: depression, psychotic disorder   Dietary assessment: A typical day consists of 3 meals and 0 snacks  Safe foods include: Ramen noodles, stir fry/saute meat & vegetables,  white rice, Pha, noodles, hot soupy, burgers, fish, pork, chicken  Avoided foods include: blue crab, condiments, fish filets, beef (limits to a few times a year), bread   24 hour recall:  B:  S: L (12 pm): rice + shredded chicken + soup (pork, eggplant, purple part of the banana) S: D  (5 pm): 1 taco (beef, salad, avocado, tomatoes) + [place] tea S (9 pm): 1 taco (beef, salad, avocado, tomatoes)  Beverages: [place] tea (8 oz), water (2*16 oz; 32 oz)  Physical activity: none reported  What Methods Do You Use To Control Your Weight (Compensatory behaviors)?           Restricting   Estimated energy intake: 800 kcal  Estimated energy needs: 2000-2200 kcal 250-275 g CHO 150-165 g pro 44-49 g fat  Nutrition Diagnosis: NB-1.5 Disordered eating pattern As related to skipping meals.  As evidenced by pt reports intentionally skipping meals at times when depressed.  Intervention/Goals: Pt was educated and counseled on eating to nourish the body, signs/symptoms of not being adequately nourished, ways to increase nourishment, Rule of 3's, and meal planning. Discussed having balanced meals to help restore pt's body using plate-by-plate method. Pt agreed with goals listed. Goals: - Continue to consume 3 meals a day.  - Have first meal within 2 hours of waking up.  - For meals, aim for 1/2 plate of starch/grain + 1/4 plate of protein + 1/4 plate of fruit/vegetable + lipid + calcium/dairy.   Meal plan:    3 meals    2-3 snacks   Monitoring and Evaluation: Patient will follow up in 4 weeks.

## 2022-05-29 NOTE — Patient Instructions (Signed)
-   Continue to consume 3 meals a day.   - Have first meal within 2 hours of waking up.   - For meals, aim for 1/2 plate of starch/grain + 1/4 plate of protein + 1/4 plate of fruit/vegetable + lipid + calcium/dairy.

## 2022-06-06 ENCOUNTER — Encounter: Payer: Self-pay | Admitting: Allergy

## 2022-06-06 ENCOUNTER — Ambulatory Visit (INDEPENDENT_AMBULATORY_CARE_PROVIDER_SITE_OTHER): Payer: Medicaid Other | Admitting: Allergy

## 2022-06-06 VITALS — BP 88/56 | HR 66 | Temp 98.0°F | Resp 18 | Ht 60.0 in | Wt 104.4 lb

## 2022-06-06 DIAGNOSIS — T781XXD Other adverse food reactions, not elsewhere classified, subsequent encounter: Secondary | ICD-10-CM

## 2022-06-06 DIAGNOSIS — L2481 Irritant contact dermatitis due to metals: Secondary | ICD-10-CM | POA: Diagnosis not present

## 2022-06-06 DIAGNOSIS — T781XXA Other adverse food reactions, not elsewhere classified, initial encounter: Secondary | ICD-10-CM

## 2022-06-06 DIAGNOSIS — J45990 Exercise induced bronchospasm: Secondary | ICD-10-CM | POA: Diagnosis not present

## 2022-06-06 MED ORDER — EPIPEN 2-PAK 0.3 MG/0.3ML IJ SOAJ: 0.3000 mg | INTRAMUSCULAR | 1 refills | Status: DC | PRN

## 2022-06-06 MED ORDER — VENTOLIN HFA 108 (90 BASE) MCG/ACT IN AERS: 2.0000 | INHALATION_SPRAY | Freq: Four times a day (QID) | RESPIRATORY_TRACT | 1 refills | Status: DC | PRN

## 2022-06-06 NOTE — Progress Notes (Signed)
New Patient Note  RE: Kaitlyn Crane MRN: [id number] DOB: 04-24-1996 Date of Office Visit: 06/06/2022  Primary care provider: Olive Bass, FNP  Chief Complaint: allergies  History of present illness: Kaitlyn Crane is a 26 y.o. female presenting today for evaluation of allergies.   She wants to know what she is allergic to.   She states there are some lotions with fragrance she has used that has caused burning and irritation of the skin.   She thinks she has a metal allergy as has noted certain buttons on jeans will break her out in the area.   She does feel she has sensitive skin.   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    [place] is [place] and states while in [place] she recalls eating snail and states she had bilateral pink eye after eating this food thus she is concerned she might have an allergy to it.   She also reports blue crab makes her congested, have chest pain and feel "suffocating in her throat".  She states she can eat snow crab without issue.  However due to blue crab issues she does not eat a lot of shellfish in general. Certain nuts will make her cough and make her throat "clog up".  So she does not eat a lot of nuts in her diet either.  No history of asthma however she states as a child she states she was always afraid to run as she would "run out of breath" and states when she runs her "chest starts clogging up".  With exercising she states she can get lightheaded too.    She does not believe she has a history of eczema.   Review of systems in the past 4 weeks: Review of Systems  Constitutional: Negative.   HENT: Negative.    Eyes: Negative.   Respiratory: Negative.    Cardiovascular: Negative.   Gastrointestinal: Negative.   Musculoskeletal: Negative.   Skin: Negative.   Allergic/Immunologic: Negative.   Neurological: Negative.     All other systems negative unless noted above in HPI  Past medical  history: Past Medical History:  Diagnosis Date   Infection 10/15/2010   UTERUS ?   No pertinent past medical history    Urticaria     Past surgical history: Past Surgical History:  Procedure Laterality Date   NO PAST SURGERIES      Family history:  Family History  Problem Relation Age of Onset   Stroke Other    Hypertension Other    Asthma Other    Alcohol abuse Neg Hx     Social history: Lives in a home with carpeting in the bedroom with electric heating and central cooling.  Cats and 1 dog in the home.  There is no concern for water damage, mildew in the home.  There is concern for roaches in the home.  She is enrolling into school.  She has no smoking history.   Medication List: Current Outpatient Medications  Medication Sig Dispense Refill   Multiple Vitamin (MULTIVITAMIN WITH MINERALS) TABS tablet Take 1 tablet by mouth daily.     mupirocin ointment (BACTROBAN) 2 % Apply 1 Application topically 2 (two) times daily as needed (extranal use only). 22 g 0   nitrofurantoin (MACRODANTIN) 50 MG capsule Take 50 mg by mouth daily.     vitamin B-12 1000 MCG tablet Take 1 tablet (1,000 mcg total) by mouth daily.  Vitamin D, Ergocalciferol, (DRISDOL) 1.25 MG (50000 UNIT) CAPS capsule Take 1 capsule (50,000 Units total) by mouth every 7 (seven) days for 12 doses. 12 capsule 0   No current facility-administered medications for this visit.    Known medication allergies: No Known Allergies   Physical examination: Blood pressure (!) 88/56, pulse 66, temperature 98 F (36.7 C), temperature source Temporal, resp. rate 18, height 5' (1.524 m), weight 104 lb 6.4 oz (47.4 kg), SpO2 98 %, currently breastfeeding.  General: Alert, interactive, in no acute distress. HEENT: PERRLA, TMs pearly gray, turbinates non-edematous without discharge, post-pharynx non erythematous. Neck: Supple without lymphadenopathy. Lungs: Clear to auscultation without wheezing, rhonchi or rales. {no  increased work of breathing. CV: Normal S1, S2 without murmurs. Abdomen: Nondistended, nontender. Skin: Warm and dry, without lesions or rashes. Extremities:  No clubbing, cyanosis or edema. Neuro:   Grossly intact.  Diagnositics/Labs:  Spirometry: FEV1: 1.96 L 75%, FVC: 2.2 L 73% predicted.  Both eyes are slightly lower than the expected normal limit for her age and demographic  Allergy testing:   Food Adult Perc - 06/06/22 1500     Time Antigen Placed 1528    Allergen Manufacturer Waynette Buttery    Location Back    Number of allergen test 15     Control-buffer 50% Glycerol Negative    Control-Histamine 1 mg/ml 2+    1. Peanut Negative    10. Cashew Negative    11. Pecan Food Negative    12. Walnut Food Negative    13. Almond Negative    14. Hazelnut Negative    16. Coconut Negative    17. Pistachio Negative    25. Shrimp 2+    26. Crab Negative    27. Lobster Negative    28. Oyster Negative    29. Scallops Negative             Allergy testing results were read and interpreted by provider, documented by clinical staff.   Assessment and plan:   Contact dermatitis (contact allergy) - patch testing is the test of choice to evaluate for contact dermatitis.  Recommend performing patch testing with the TRUE test patch panels.  Patches are best placed on a Monday with return to office on Wednesday and Friday of same week for readings.  Once patches are in place to do not get them wet.  You can take antihistamines while patches are in place.   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  Negative to nuts.  - continue avoidance of shellfish as they can be cross-reactive - have access to self-injectable epinephrine (Epipen or AuviQ) 0.3mg  at all times - follow emergency action plan in case of allergic reaction  Exercise-induced asthma -have access to albuterol inhaler 2 puffs every 4-6 hours as needed  for cough/wheeze/shortness of breath/chest tightness.  May use 15-20 minutes prior to activity.   Monitor frequency of use.    Schedule for patch testing for TRUE test patch placement Routine follow-up in 4-6 months or sooner if needed.  I appreciate the opportunity to take part in Dalene's care. Please do not hesitate to contact me with questions.  Sincerely,   Margo Aye, MD Allergy/Immunology [facility]

## 2022-06-06 NOTE — Patient Instructions (Addendum)
Contact dermatitis (contact allergy) - patch testing is the test of choice to evaluate for contact dermatitis.  Recommend performing patch testing with the TRUE test patch panels.  Patches are best placed on a Monday with return to office on Wednesday and Friday of same week for readings.  Once patches are in place to do not get them wet.  You can take antihistamines while patches are in place.   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  Negative to nuts.  - continue avoidance of shellfish as they can be cross-reactive - have access to self-injectable epinephrine (Epipen or AuviQ) 0.3mg  at all times - follow emergency action plan in case of allergic reaction  Exercise-induced asthma -have access to albuterol inhaler 2 puffs every 4-6 hours as needed for cough/wheeze/shortness of breath/chest tightness.  May use 15-20 minutes prior to activity.   Monitor frequency of use.    Schedule for patch testing for TRUE test patch placement Routine follow-up in 4-6 months or sooner if needed

## 2022-06-11 ENCOUNTER — Encounter: Payer: Self-pay | Admitting: Family

## 2022-06-11 ENCOUNTER — Ambulatory Visit (INDEPENDENT_AMBULATORY_CARE_PROVIDER_SITE_OTHER): Payer: Medicaid Other | Admitting: Family

## 2022-06-11 VITALS — BP 100/66 | HR 86 | Temp 98.0°F | Resp 16 | Ht 60.0 in | Wt 102.8 lb

## 2022-06-11 DIAGNOSIS — L239 Allergic contact dermatitis, unspecified cause: Secondary | ICD-10-CM | POA: Diagnosis not present

## 2022-06-11 DIAGNOSIS — J310 Chronic rhinitis: Secondary | ICD-10-CM

## 2022-06-11 NOTE — Patient Instructions (Signed)
Allergic contact dermatitis - Instructions provided on care of the patches for the next 48 hours. - [name] was instructed to avoid showering for the next 48 hours. - Kaitlyn Crane will follow up in 48 hours and 96 hours for patch readings.  Possible rhinitis Schedule an appointment at your convenience for skin testing to environmental allergens. You will neede to be off all antihistamines 3 days prior to this appointment

## 2022-06-11 NOTE — Progress Notes (Signed)
Follow-up Note  RE: Nijah Tejera MRN: [id number] DOB: 08/05/1996 Date of Office Visit: 06/11/2022  Primary care provider: Olive Bass, FNP Referring provider: Olive Bass,*   Aubryn returns to the office today for the patch test placement, given suspected history of contact dermatitis. She has not been on any steroids for the past 4 weeks.  She is requesting skin testing to environmental allergens. She reports that when she is in grass or around trees she will get bumps. She also gets congested in her nose and chest when around cats. Her symptoms occur with season changes and is worse in the spring.   Diagnostics: True Test patches placed.    Plan:   Allergic contact dermatitis - Instructions provided on care of the patches for the next 48 hours. - Mouna was instructed to avoid showering for the next 48 hours. - Stepanie will follow up in 48 hours and 96 hours for patch readings.   Possible rhinitis Schedule an appointment at your convenience for skin testing to environmental allergens. You will neede to be off all antihistamines 3 days prior to this appointment  Nehemiah Settle, FNP [facility]

## 2022-06-12 NOTE — Progress Notes (Unsigned)
   Follow Up Note  RE: Kaitlyn Crane MRN: [id number] DOB: [birth year] Date of Office Visit: 06/13/2022  Referring provider: Olive Bass,* Primary care provider: Olive Bass, FNP  History of Present Illness: I had the pleasure of seeing Kaitlyn Crane for a follow up visit at the [facility] on 06/13/2022. She is a 26 y.o. female, who is being followed for dermatitis. Today she is here for initial patch test interpretation, given suspected history of contact dermatitis.   Diagnostics:  TRUE TEST 48 hour reading:   T.R.U.E. Test - 06/13/22 1015     Time Antigen Placed 1015    Manufacturer Other    Location Back    Number of Test 36    Reading Interval Day 1    Panel Panel 1;Panel 2;Panel 3    1. Nickel Sulfate 2    2. Wool Alcohols 0    3. Neomycin Sulfate 0    4. Potassium Dichromate 0    5. Caine Mix 0    6. Fragrance Mix 0    7. Colophony 0    8. Paraben Mix 0    9. Negative Control 0    10. Balsam of [place] 0    11. Ethylenediamine Dihydrochloride 0    12. Cobalt Dichloride 0    13. p-tert Butylphenol Formaldehyde Resin 0    14. Epoxy Resin 0    15. Carba Mix --   +-   16.  Black Rubber Mix 0    17. Cl+ Me-Isothiazolinone 0    18. Quaternium-15 0    19. Methyldibromo Glutaronitrile 0    20. p-Phenylenediamine 0    21. Formaldehyde 0    22. Mercapto Mix 0    23. Thimerosal 0    24. Thiuram Mix 0    25. Diazolidinyl Urea 0    26. Quinoline Mix 0    27. Tixocortol-21-Pivalate 0    28. Gold Sodium Thiosulfate 0    29. Imidazolidinyl Urea 0    30. Budesonide 0    31. Hydrocortisone-17-Butyrate 0    32. Mercaptobenzothiazole 0    33. Bacitracin 0    34. Parthenolide 0    35. Disperse Blue 106 0    36. 2-Bromo-2-Nitropropane-1,3-diol 0              Assessment and Plan: Kaitlyn Crane is a 26 y.o. female with: Allergic contact dermatitis due to other agents TRUE patches removed today. Today's reading was positive to nickel and borderline  to carba. Handouts given. Return on Friday for final read.  Return in about 2 days (around 06/15/2022) for Patch reading.  It was my pleasure to see Jeannia today and participate in her care. Please feel free to contact me with any questions or concerns.  Sincerely,  [name], DO Allergy & Immunology  [facility] office: [phone] [facility] office: [phone] [place] office: [phone]

## 2022-06-13 ENCOUNTER — Encounter: Payer: Self-pay | Admitting: Allergy

## 2022-06-13 ENCOUNTER — Encounter: Payer: Medicaid Other | Admitting: Allergy

## 2022-06-13 ENCOUNTER — Ambulatory Visit: Payer: Medicaid Other | Admitting: Allergy

## 2022-06-13 DIAGNOSIS — L2389 Allergic contact dermatitis due to other agents: Secondary | ICD-10-CM | POA: Insufficient documentation

## 2022-06-13 NOTE — Patient Instructions (Signed)
Removed patches. Today's reading was positive to nickel and borderline to carba. Handout given. Return on Friday for final read.  May take benadryl orally if needed for itching. Avoid placing anything on the back.

## 2022-06-13 NOTE — Assessment & Plan Note (Signed)
   TRUE patches removed today.  Today's reading was positive to nickel and borderline to carba.  Handouts given.  Return on Friday for final read.

## 2022-06-15 ENCOUNTER — Encounter: Payer: Self-pay | Admitting: Allergy

## 2022-06-15 ENCOUNTER — Ambulatory Visit: Payer: Medicaid Other | Admitting: Allergy

## 2022-06-15 DIAGNOSIS — L2389 Allergic contact dermatitis due to other agents: Secondary | ICD-10-CM

## 2022-06-15 NOTE — Progress Notes (Signed)
Follow-up Note  RE: Kaitlyn Crane MRN: [id number] DOB: [birth year] Date of Office Visit: 06/15/2022  Primary care provider: Olive Bass, FNP Referring provider: Olive Bass,*   Kaitlyn Crane returns to the office today for the final patch test interpretation, given suspected history of contact dermatitis.    Diagnostics:  TRUE TEST 96 hour reading:   T.R.U.E. Test     Row Name 06/15/22 0931           Time Antigen Placed 0908       Manufacturer Other       Lot # [id number]       Location Back       Number of Test 36       Reading Interval Day 3       Panel Panel 1;Panel 2;Panel 3       1. Nickel Sulfate 2       2. Wool Alcohols 0       3. Neomycin Sulfate 0       4. Potassium Dichromate 0       5. Caine Mix 0       6. Fragrance Mix 0       7. Colophony 0       8. Paraben Mix 0       9. Negative Control 0       10. Balsam of [place] 0       11. Ethylenediamine Dihydrochloride 0       12. Cobalt Dichloride 0       13. p-tert Butylphenol Formaldehyde Resin 0       14. Epoxy Resin 0       15. Carba Mix 0       16.  Black Rubber Mix 0       17. Cl+ Me-Isothiazolinone 0       18. Quaternium-15 0       19. Methyldibromo Glutaronitrile 0       20. p-Phenylenediamine 0       21. Formaldehyde 0       22. Mercapto Mix 0       23. Thimerosal 0       24. Thiuram Mix 0       25. Diazolidinyl Urea 0       26. Quinoline Mix 0       27. Tixocortol-21-Pivalate 0       28. Gold Sodium Thiosulfate 0       29. Imidazolidinyl Urea 0       30. Budesonide 0       31. Hydrocortisone-17-Butyrate 0       32. Mercaptobenzothiazole 0       33. Bacitracin 0       34. Parthenolide 0       35. Disperse Blue 106 0       36. 2-Bromo-2-Nitropropane-1,3-diol 0                   Plan:  Allergic contact dermatitis The patient has been provided detailed information regarding the substances she is sensitive to, as well as products containing the substances.  Meticulous avoidance  of these substances is recommended. If avoidance is not possible, the use of barrier creams or lotions is recommended. If symptoms persist or progress despite meticulous avoidance of above allergens, dermatology evaluation may be warranted. Will email product safety list to [email or web address]  Margo Aye, MD [facility]

## 2022-06-19 DIAGNOSIS — E559 Vitamin D deficiency, unspecified: Secondary | ICD-10-CM | POA: Diagnosis not present

## 2022-06-19 DIAGNOSIS — Z09 Encounter for follow-up examination after completed treatment for conditions other than malignant neoplasm: Secondary | ICD-10-CM | POA: Diagnosis not present

## 2022-06-19 DIAGNOSIS — Z23 Encounter for immunization: Secondary | ICD-10-CM | POA: Diagnosis not present

## 2022-06-20 ENCOUNTER — Encounter: Payer: Self-pay | Admitting: Allergy

## 2022-06-20 ENCOUNTER — Ambulatory Visit (INDEPENDENT_AMBULATORY_CARE_PROVIDER_SITE_OTHER): Payer: Medicaid Other | Admitting: Allergy

## 2022-06-20 VITALS — BP 96/62 | HR 77 | Temp 98.7°F

## 2022-06-20 DIAGNOSIS — L2389 Allergic contact dermatitis due to other agents: Secondary | ICD-10-CM

## 2022-06-20 DIAGNOSIS — T781XXD Other adverse food reactions, not elsewhere classified, subsequent encounter: Secondary | ICD-10-CM | POA: Diagnosis not present

## 2022-06-20 DIAGNOSIS — J3089 Other allergic rhinitis: Secondary | ICD-10-CM | POA: Diagnosis not present

## 2022-06-20 DIAGNOSIS — J45990 Exercise induced bronchospasm: Secondary | ICD-10-CM | POA: Diagnosis not present

## 2022-06-20 NOTE — Patient Instructions (Addendum)
Rhinitis - Environmental allergy testing today showed: dust mites. - Copy of test results provided.  - Avoidance measures provided. - For allergy symptoms can take antihistamine like Zyrtec, Allegra or Xyzal daily as needed - For nasal congestion/drainage can use Flonase 2 sprays each nostril daily for 1-2 weeks at a time before stopping once nasal congestion improves for maximum benefit - For itchy/watery eyes can use Pataday 1 drop each eye daily as needed  -------------------  Contact dermatitis (contact allergy) - patch testing was positive to nickel and carba mix.  Do your best to avoid these products.  You have been provided with a product safe list from the contact dermatitis society to reference (products on the list should not contain nickel or carba mix in their ingredients.  - if you develop rash (red, irritated, dry, itchy, patchy, scaly, flaky) only areas on the body then you can use Triamcinolone 0.1 % ointment twice a day as needed. 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  Negative to nuts.  - continue avoidance of shellfish as they can be cross-reactive - have access to self-injectable epinephrine (Epipen or AuviQ) 0.3mg  at all times - follow emergency action plan in case of allergic reaction  Exercise-induced asthma -have access to albuterol inhaler 2 puffs every 4-6 hours as needed for cough/wheeze/shortness of breath/chest tightness.  May use 15-20 minutes prior to activity.   Monitor frequency of use.     Routine follow-up in 6 months or sooner if needed

## 2022-06-20 NOTE — Therapy (Signed)
OUTPATIENT PHYSICAL THERAPY THORACOLUMBAR EVALUATION   Patient Name: Kaitlyn Crane MRN: [id number] DOB:1996/06/05, 26 y.o., female Today's Date: 06/21/2022   PT End of Session - 06/21/22 0849     Visit Number 1    Number of Visits 13    Date for PT Re-Evaluation 08/16/22    Authorization Type [employer] MCD    Authorization - Number of Visits 27    PT Start Time 0852    PT Stop Time 0938    PT Time Calculation (min) 46 min    Activity Tolerance Patient tolerated treatment well    Behavior During Therapy [facility] for tasks assessed/performed             Past Medical History:  Diagnosis Date   Infection 10/15/2010   UTERUS ?   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   Encounter for insertion of mirena IUD - inserted 12/12/12 12/12/2012   SVD (spontaneous vaginal delivery) 09/23/2012   Chorioamnionitis 09/23/2012   Late prenatal care 08/27/2012   Normal pregnancy, first 08/11/2012   Anemia 09/17/2011    PCP: Olive Bass, FNP  REFERRING PROVIDER: Olive Bass, FNP  REFERRING DIAG: M79.604,M79.605 (ICD-10-CM) - Pain in both lower extremities  Rationale for Evaluation and Treatment Rehabilitation  THERAPY DIAG:  Pain in right thigh  Pain in right ankle and joints of right foot  Muscle weakness (generalized)  Difficulty in walking, not elsewhere classified  ONSET DATE: About a year ago while working out  SUBJECTIVE:                                                                                                                                                                                            SUBJECTIVE STATEMENT: Was working out with sister last year after a period of not working out - felt she lifted too heavy, went to bathroom to throw up, had difficulty walking. Leg was shaking and had difficulty with basic mobility. Since then symptoms have improved but continues to have symptoms particularly with standing and walking. Tends to have soreness after activity. Also  reports difficulty with prolonged sitting which increases difficulty driving to see family out of state. Reports significant life stressors over past year with some weight changes (she attributes to stress and veganism), no new changes in bowel/bladder. Pt reports majority of pain resides in R thigh and ankle.    PERTINENT HISTORY:  Exercise induced asthma, pt also reports she received epidural 9 years ago without effect during delivery of child  PAIN:  Are you having pain: yes, currently mostly in R ankle 6/10 Location: RLE, lateral thigh down to ankle How would you describe your pain? Aching, tingling Best: 4/10 Worst: 9/10 Aggravating factors: standing/walking >10-27min, sitting >68min, driving, working out Easing factors: ice, heat, icy hot lying down/resting   PRECAUTIONS: None  WEIGHT BEARING RESTRICTIONS No  FALLS:  Has patient fallen in last 6 months? Yes. Number of falls shortly after initial incident while "hopping around", also sometimes gets dizzy when standing up too quickly and has to sit back down, denies any overt falls with these episodes. States her PCP is aware  LIVING ENVIRONMENT: Lives with: lives with their family Lives in: House/apartment, 1 level, 2 steps to enter without rails Has following equipment at home: None  OCCUPATION: not currently working  PLOF: Independent  PATIENT GOALS  being flexible, be outside more and be able to  do more activities. Would like to be able to be more active, interested in yoga/pilates   OBJECTIVE:   DIAGNOSTIC FINDINGS:  Lumbar Xray 05/15/2022 FINDINGS: Normal anatomic alignment. No evidence for acute fracture or dislocation. Preservation of the vertebral body and intervertebral disc space heights. SI joints unremarkable. Intrauterine device is present.   IMPRESSION: Negative.  PATIENT SURVEYS:  LEFS 51/80  SCREENING FOR RED FLAGS: negative  COGNITION:  Overall cognitive status: Within functional limits for tasks assessed     SENSATION: LT increased on RLE vs LLE in all dermatomes   POSTURE: No Significant postural limitations  PALPATION: Deferred on this date due to time constraints  LUMBAR ROM:   Active  A/PROM  eval  Flexion WFL  Extension WFL  Right rotation WFL  Left rotation WFL   (Blank rows = not tested) Comments: No pain with lumbar ROM although pt does report stretching in abdomen w/ lumbar extension  LOWER EXTREMITY MMT:    MMT Right eval Left eval  Hip flexion 3+ 3+  Hip abduction 5 5  Hip internal rotation 3+ 3+  Hip external rotation 3+ 3+  Knee flexion 4 4  Knee extension 4 4  Ankle dorsiflexion 4+, p! 4  Ankle inversion 4, p! 4  Ankle eversion 4 4   (Blank rows = not tested) Comments: Pain in thigh w B MMT, R more sensitive than L. Most discomfort in anterior/medial ankle with MMT   FUNCTIONAL TESTS:  Slump + on R negative on L   GAIT: Distance walked: within clinic Assistive device utilized: None Level of assistance: Complete Independence Comments: Gait mechanics grossly WNL, no evidence of instability or significant impairment although pt does report reduced tolerance to ambulation    TODAY'S TREATMENT  Significant time spent discussing pt symptoms and concerns/goals. Performed HEP, education as below.    PATIENT EDUCATION:  Education details: Pt education on PT impairments, prognosis, and POC. Rationale for  interventions, safe/appropriate HEP performance Person educated: Patient Education method: Explanation, Demonstration, Tactile cues, Verbal cues, and Handouts Education comprehension: verbalized understanding, returned demonstration, verbal cues required, tactile cues required, and needs further education    HOME EXERCISE PROGRAM: Access  Code: [email or web address] URL: [email or web address] Date: 06/21/2022 Prepared by: Fransisco Hertz  Exercises - Seated Sciatic Tensioner  - 1 x daily - 7 x weekly - 3 sets - 10 reps  ASSESSMENT:  CLINICAL IMPRESSION: Patient is a 26 y.o. woman who was seen today for physical therapy evaluation and treatment for chronic RLE pain since gym injury about a year ago. Pt reports primary exacerbating activities of sitting/standing/walking. Pain is not reproduced by lumbar movement, is provoked by MMT RLE and general LE movement. Mild increase in sensitivity to light touch on RLE compared to L. Positive slump test on RLE, denies overt increase in pain with sciatic tensioning exercises but does report fatigue. Pt currently limited in household and community activities due to R LE pain and strength deficits, recommend skilled PT to address these deficits and maximize functional independence. Pt departs today's session in no acute distress, all voiced questions/concerns addressed appropriately from PT perspective.     OBJECTIVE IMPAIRMENTS decreased activity tolerance, decreased endurance, decreased mobility, difficulty walking, decreased strength, and pain.   ACTIVITY LIMITATIONS carrying, lifting, sitting, standing, squatting, sleeping, and toileting  PARTICIPATION LIMITATIONS: cleaning, laundry, driving, and community activity  PERSONAL FACTORS Past/current experiences and Time since onset of injury/illness/exacerbation are also affecting patient's functional outcome.   REHAB POTENTIAL: Good  CLINICAL DECISION MAKING: Evolving/moderate  complexity  EVALUATION COMPLEXITY: Moderate   GOALS: Goals reviewed with patient? Yes  SHORT TERM GOALS: Target date: 07/19/2022  Pt will demonstrate appropriate understanding and performance of initially prescribed HEP in order to facilitate improved independence with management of symptoms.  Baseline: HEP provided on eval Goal status: INITIAL   2.  Pt will score >60/80 on LEFS in order to indicate improved perception of function due to symptoms.  Baseline: 51/80 Goal status: INITIAL   LONG TERM GOALS: Target date: 08/16/2022  Pt will score 70 or above on LEFS in order to demonstrate improved perception of functional status due to symptoms.  Baseline: 51/80 Goal status: INITIAL  2.  Pt will demonstrate B hip ER/IR MMT of 4/5 in order to demonstrate improved strength for functional movements.  Baseline: 3+/5 B  Goal status: INITIAL  3. Pt will report ability to walk/stand for >56min with less than 4/10 pain in order to maximize tolerance to community and social activities.  Baseline: 10-15 min Goal status: INITIAL   4. Pt will report ability to sit for >1hr with less than 4/10 pain in order to promote tolerance to driving for family activities.   Baseline:  Goal status: INITIAL   PLAN: PT FREQUENCY: 1-2x/week (plan for 2x/week 4 weeks, 1x/week 4 weeks)  PT DURATION: 8 weeks  PLANNED INTERVENTIONS: Therapeutic exercises, Therapeutic activity, Neuromuscular re-education, Balance training, Gait training, Patient/Family education, Self Care, Joint mobilization, Joint manipulation, Stair training, Aquatic Therapy, Dry Needling, Spinal manipulation, Spinal mobilization, Cryotherapy, Moist heat, Manual therapy, and Re-evaluation.  PLAN FOR NEXT SESSION: Progress ROM/strengthening exercises as able/appropriate, review HEP. Pt interested in pilates/yoga, may benefit from incorporating into program as appropriate.    Ashley Murrain, PT 06/21/2022, 10:19 AM    Check all  possible CPT codes: 33545 - PT Re-evaluation, 97110- Therapeutic Exercise, [phone]- Neuro Re-education, [phone] - Gait Training, [phone] - Manual Therapy, [phone] - Therapeutic Activities, [phone] - Self Care, and [phone] - Aquatic therapy     If treatment provided at initial evaluation, no treatment charged due to lack of authorization.

## 2022-06-20 NOTE — Progress Notes (Signed)
Follow-up Note  RE: Kaitlyn Crane MRN: [id number] DOB: May 29, 1996 Date of Office Visit: 06/20/2022   History of present illness: Kaitlyn Crane is a 26 y.o. female presenting today for skin testing for environmental allergens.  She was last seen in the office on 06/15/2022 for her final patch read.  She did discuss possible rhinitis symptoms with this practitioner Amada Jupiter at her patch placement visit and was recommended to do environmental allergy testing.  She has not had any antihistamines in the past 2 days for testing today. For her exercise induced asthma she state she doesn't really know how to use the inhaler properly and would like a demonstration.   Medication List: Current Outpatient Medications  Medication Sig Dispense Refill   EPIPEN 2-PAK 0.3 MG/0.3ML SOAJ injection Inject 0.3 mg into the muscle as needed for anaphylaxis. 1 each 1   Multiple Vitamin (MULTIVITAMIN WITH MINERALS) TABS tablet Take 1 tablet by mouth daily.     mupirocin ointment (BACTROBAN) 2 % Apply 1 Application topically 2 (two) times daily as needed (extranal use only). 22 g 0   nitrofurantoin (MACRODANTIN) 50 MG capsule Take 50 mg by mouth daily.     VENTOLIN HFA 108 (90 Base) MCG/ACT inhaler Inhale 2 puffs into the lungs every 6 (six) hours as needed for wheezing or shortness of breath. 18 g 1   vitamin B-12 1000 MCG tablet Take 1 tablet (1,000 mcg total) by mouth daily.     Vitamin D, Ergocalciferol, (DRISDOL) 1.25 MG (50000 UNIT) CAPS capsule Take 1 capsule (50,000 Units total) by mouth every 7 (seven) days for 12 doses. 12 capsule 0   No current facility-administered medications for this visit.     Known medication allergies: No Known Allergies   Physical examination: Blood pressure 96/62, pulse 77, temperature 98.7 F (37.1 C), SpO2 99 %, currently breastfeeding.  General: Alert, interactive, in no acute distress. HEENT: PERRLA, TMs pearly gray, turbinates non-edematous without discharge, post-pharynx non  erythematous. Neck: Supple without lymphadenopathy. Lungs: Clear to auscultation without wheezing, rhonchi or rales. {no increased work of breathing. CV: Normal S1, S2 without murmurs. Abdomen: Nondistended, nontender. Skin: Warm and dry, without lesions or rashes. Extremities:  No clubbing, cyanosis or edema. Neuro:   Grossly intact.  Diagnositics/Labs:  Allergy testing:   Airborne Adult Perc - 06/20/22 0925     Time Antigen Placed 0935    Allergen Manufacturer Waynette Buttery    Location Back    Number of Test 59    Panel 1 Select    1. Control-Buffer 50% Glycerol Negative    2. Control-Histamine 1 mg/ml 2+    3. Albumin saline Negative    4. Bahia Negative    5. French Southern Territories Negative    6. Johnson Negative    7. Kentucky Blue Negative    8. Meadow Fescue Negative    9. Perennial Rye Negative    10. Sweet Vernal Negative    11. Timothy Negative    12. Cocklebur Negative    13. Burweed Marshelder Negative    14. Ragweed, short Negative    15. Ragweed, Giant Negative    16. Plantain,  English Negative    17. Lamb's Quarters Negative    18. Sheep Sorrell Negative    19. Rough Pigweed Negative    20. Marsh Elder, Rough Negative    21. Mugwort, Common Negative    22. Ash mix Negative    23. Birch mix Negative    24. Van Clines American Negative  25. Box, Elder Negative    26. Cedar, red Negative    27. Cottonwood, Guinea-Bissau Negative    28. Elm mix Negative    29. Hickory Negative    30. Maple mix Negative    31. Oak, Guinea-Bissau mix Negative    32. Pecan Pollen Negative    33. Pine mix Negative    34. Sycamore Eastern Negative    35. Walnut, Black Pollen Negative    36. Alternaria alternata Negative    37. Cladosporium Herbarum Negative    38. Aspergillus mix Negative    39. Penicillium mix Negative    40. Bipolaris sorokiniana (Helminthosporium) Negative    41. Drechslera spicifera (Curvularia) Negative    42. Mucor plumbeus Negative    43. Fusarium moniliforme Negative    44.  Aureobasidium pullulans (pullulara) Negative    45. Rhizopus oryzae Negative    46. Botrytis cinera Negative    47. Epicoccum nigrum Negative    48. Phoma betae Negative    49. Candida Albicans Negative    50. Trichophyton mentagrophytes Negative    51. Mite, D Farinae  5,000 AU/ml 2+    52. Mite, D Pteronyssinus  5,000 AU/ml Negative    53. Cat Hair 10,000 BAU/ml Negative    54.  Dog Epithelia Negative    55. Mixed Feathers Negative    56. Horse Epithelia Negative    57. Cockroach, German Negative    58. Mouse Negative    59. Tobacco Leaf Negative             Allergy testing results were read and interpreted by provider, documented by clinical staff.   Assessment and plan:   Rhinitis - Environmental allergy testing today showed: dust mites. - Copy of test results provided.  - Avoidance measures provided. - For allergy symptoms can take antihistamine like Zyrtec, Allegra or Xyzal daily as needed - For nasal congestion/drainage can use Flonase 2 sprays each nostril daily for 1-2 weeks at a time before stopping once nasal congestion improves for maximum benefit - For itchy/watery eyes can use Pataday 1 drop each eye daily as needed  Contact dermatitis (contact allergy) - patch testing was positive to nickel and carba mix.  Do your best to avoid these products.  You have been provided with a product safe list from the contact dermatitis society to reference (products on the list should not contain nickel or carba mix in their ingredients.  - if you develop rash (red, irritated, dry, itchy, patchy, scaly, flaky) only areas on the body then you can use Triamcinolone 0.1 % ointment twice a day as needed. 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  Negative to nuts.  - continue avoidance of shellfish as they can be cross-reactive - have access to self-injectable epinephrine (Epipen or AuviQ) 0.3mg  at all  times - follow emergency action plan in case of allergic reaction  Exercise-induced asthma -have access to albuterol inhaler 2 puffs every 4-6 hours as needed for cough/wheeze/shortness of breath/chest tightness.  May use 15-20 minutes prior to activity.   Monitor frequency of use.   - demonstration on inhaler use provided today  Routine follow-up in 6 months or sooner if needed   I appreciate the opportunity to take part in Davonda's care. Please do not hesitate to contact me with questions.  Sincerely,   Margo Aye, MD Allergy/Immunology [facility]

## 2022-06-21 ENCOUNTER — Ambulatory Visit: Payer: Medicaid Other | Attending: Internal Medicine | Admitting: Physical Therapy

## 2022-06-21 ENCOUNTER — Encounter: Payer: Self-pay | Admitting: Physical Therapy

## 2022-06-21 ENCOUNTER — Other Ambulatory Visit: Payer: Self-pay

## 2022-06-21 DIAGNOSIS — M79605 Pain in left leg: Secondary | ICD-10-CM | POA: Diagnosis not present

## 2022-06-21 DIAGNOSIS — M6281 Muscle weakness (generalized): Secondary | ICD-10-CM | POA: Diagnosis present

## 2022-06-21 DIAGNOSIS — M25571 Pain in right ankle and joints of right foot: Secondary | ICD-10-CM | POA: Diagnosis present

## 2022-06-21 DIAGNOSIS — M79604 Pain in right leg: Secondary | ICD-10-CM | POA: Insufficient documentation

## 2022-06-21 DIAGNOSIS — M79651 Pain in right thigh: Secondary | ICD-10-CM | POA: Diagnosis present

## 2022-06-21 DIAGNOSIS — R262 Difficulty in walking, not elsewhere classified: Secondary | ICD-10-CM | POA: Insufficient documentation

## 2022-06-26 ENCOUNTER — Encounter: Payer: Medicaid Other | Attending: Family | Admitting: Registered"

## 2022-06-26 ENCOUNTER — Encounter: Payer: Self-pay | Admitting: Registered"

## 2022-06-26 DIAGNOSIS — Z713 Dietary counseling and surveillance: Secondary | ICD-10-CM | POA: Insufficient documentation

## 2022-06-26 NOTE — Patient Instructions (Addendum)
-   Aim to have Ensure with meals if unable to have dairy with meals.   - Aim to have 3 meals + 3 snacks a day.   - Be sure snacks have at least 2 food groups.   - For meals, aim for 1/2 plate of starch/grain + 1/4 plate of protein + 1/4 plate of fruit/vegetable + lipid + calcium/dairy.

## 2022-06-26 NOTE — Progress Notes (Signed)
Appointment start time: 3:06  Appointment end time: 3:47  Patient was seen on 06/26/2022 for nutrition counseling pertaining to disordered eating  Primary care provider: Ria Clock, FNP Therapist: Aundra Millet [facility]; has appt 9/14 with therapist, and psychiatrist on 9/18)   ROI: N/A Any other medical team members: none   Assessment  Pt arrives with son. States she has low blood pressure States her vision is challenged an has been happening since her visit to [employer].   States she has started physical therapy 2x/week due to injury from weight lifting.   States she has been waking about before 7 am  States her parents have been buying mandarin oranges and bananas to have at home. States she loves watermelon.   Reports she was diagnosed with depression in 01/2022. State she was vegan for 3 years but she is on longer vegan but wants to return to this lifestyle. States she wants to add meat and increase weight. States her weight was 90 lbs and its back to 102 lbs. States the most she has weighed was 115 lbs. States she weighed 105 lbs when vegan. Reports her family (parents and siblings) were concerned about vegan lifestyle and wanted her to eat meat. States she decided to switch to vegan lifestyle after watching nutrition documentaries. Reports history of being called fat by ex-mother-in-law when she weighed 115 lbs.  Reports challenges with sleep; she is waking up prior to alarm going off in the mornings. States she was living with boyfriend and son in and then moved to another state, which didn't go well for them. States they argued a lot and she would intentionally starve herself. States she was dealing with financial stress. In 07/2021 - moved in cousin and got sick related to depression.  States she used to eat 1 meal day. 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 States she feels hungry and will not eat, wakes up hungry at times and will  not eat. States she cooks with grape seed or avocado oil.   States culturally, people are to eat a certain diet for 4 weeks after giving birth. States she did not follow this and also experienced postpartum depression.   States she is looking for a new PCP. States she is seeing an Proofreader and will see them at the end of August.   Lives with parents and 30 year old son. Receives EBT benefits $516/month and states this is not enough to provide for them.   Growth Metrics: Goal rate of weight gain:  0.5-1.0 lb/week  Eating history: Length of time: 1 years Previous treatments: none Goals for RD meetings: improve hair shedding, dizziness/lightheadedness, headaches, focus/concentration, cold intolerance   Weight history:  Highest weight: 115; while pregnant 129   Lowest weight: 90 Most consistent weight: 104-106 What would you like to weigh: no How has weight changed in the past year: weight loss  Medical Information:  Changes in hair, skin, nails since ED started: continued hair shedding, yellowish skin, brittle nails, easily bruised, easily bleeds Chewing/swallowing difficulties: yes, tonsils will give trouble with swallowing Reflux or heartburn: no Trouble with teeth: has 6 cavities, has trouble with grinding teeth since childhood LMP without the use of hormones: 8/9  Weight at that point:  Effect of exercise on menses: none reported   Effect of hormones on menses: N/A; just had Mirena removed on 8/9, has had it for 9 years Constipation, diarrhea: has BM 3-4x/week Dizziness/lightheadedness: yes, a few times  throughout the week when getting up too quickly Headaches/body aches: yes, sometimes; legs going to sleep easily Heart racing/chest pain: yes, recently since 07/2021 Mood: moody Sleep: 6 hrs/night; does not feel rested Focus/concentration: yes Cold intolerance: yes Vision changes: sometimes, blurred vision  Mental health diagnosis: depression, psychotic disorder   Dietary  assessment: A typical day consists of 3 meals and 1 snacks  Safe foods include: Ramen noodles, stir fry/saute meat & vegetables, white rice, Pha, noodles, hot soupy, burgers, fish, pork, chicken  Avoided foods include: blue crab, condiments, fish filets, beef (limits to a few times a year), bread   24 hour recall: shrimp B: cereal (Fruit Loops) + whole milk + yogurt S:  L (12 pm): Ramen noodles + sausage + eggs  S: D (6 pm): 4 chicken wings + rice + eggplant  S (9 pm): cereal (Fruit Loops) + whole milk  Beverages: water (2*16 oz; 32 oz), Thai tea, lemonade  Physical activity: none reported  What Methods Do You Use To Control Your Weight (Compensatory behaviors)?           Restricting   Estimated energy intake: 800 kcal  Estimated energy needs: 2000-2200 kcal 250-275 g CHO 150-165 g pro 44-49 g fat  Nutrition Diagnosis: NB-1.5 Disordered eating pattern As related to skipping meals.  As evidenced by pt reports intentionally skipping meals at times when depressed.  Intervention/Goals: Pt was educated and counseled on eating to nourish the body, signs/symptoms of not being adequately nourished, ways to increase nourishment, Rule of 3's, and meal planning. Discussed having balanced meals to help restore pt's body using plate-by-plate method. Pt agreed with goals listed. Goals: - Continue to consume 3 meals a day.  - Have first meal within 2 hours of waking up.  - For meals, aim for 1/2 plate of starch/grain + 1/4 plate of protein + 1/4 plate of fruit/vegetable + lipid + calcium/dairy.   Meal plan:    3 meals    2-3 snacks   Monitoring and Evaluation: Patient will follow up in 4 weeks.

## 2022-06-28 DIAGNOSIS — F331 Major depressive disorder, recurrent, moderate: Secondary | ICD-10-CM | POA: Diagnosis not present

## 2022-06-29 NOTE — Therapy (Signed)
OUTPATIENT PHYSICAL THERAPY TREATMENT NOTE   Patient Name: Delia Slatten MRN: [id number] DOB:1996-03-13, 26 y.o., female Today's Date: 07/02/2022  PCP: Olive Bass, FNP   REFERRING PROVIDER: Olive Bass, FNP  END OF SESSION:   PT End of Session - 07/02/22 0853     Visit Number 2    Number of Visits 13    Date for PT Re-Evaluation 08/16/22    Authorization - Visit Number 2    Authorization - Number of Visits 27    PT Start Time 0855    PT Stop Time 0926    PT Time Calculation (min) 31 min    Activity Tolerance Patient tolerated treatment well;Patient limited by fatigue    Behavior During Therapy [facility] for tasks assessed/performed             Past Medical History:  Diagnosis Date   Infection 10/15/2010   UTERUS ?   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   Encounter for insertion of mirena IUD - inserted 12/12/12 12/12/2012   SVD (spontaneous vaginal delivery) 09/23/2012   Chorioamnionitis 09/23/2012   Late prenatal care 08/27/2012   Normal pregnancy, first 08/11/2012   Anemia 09/17/2011    REFERRING DIAG: REFERRING DIAG: M79.604,M79.605 (ICD-10-CM) - Pain in both lower extremities  THERAPY DIAG:  Pain in right thigh  Pain in right ankle and joints of right foot  Muscle weakness (generalized)  Difficulty in walking, not elsewhere classified  Rationale for Evaluation and Treatment Rehabilitation  PERTINENT HISTORY: Exercise  induced asthma, pt also reports she received epidural 9 years ago without effect during delivery of child  PAIN: Are you having pain: yes, currently mostly in R ankle 6/10 Location: RLE, lateral thigh down to ankle How would you describe your pain? Aching, tingling Best: 4/10 Worst: 9/10 Aggravating factors: standing/walking >10-44min, sitting >12min, driving, working out Easing factors: ice, heat, icy hot lying down/resting  PRECAUTIONS: none  SUBJECTIVE: Pt arrives with report of 6/10 pain at present, mostly in ankle and knee RLE. 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 Reports limited ability to perform HEP due to schedule . No other new changes per pt, pleasant and agreeable throughout.     OBJECTIVE: (objective measures completed at initial evaluation unless otherwise dated)   DIAGNOSTIC FINDINGS:  Lumbar Xray 05/15/2022 FINDINGS: Normal anatomic alignment. No evidence for acute fracture or dislocation. Preservation of the vertebral body and intervertebral disc space heights. SI joints unremarkable. Intrauterine device is present.   IMPRESSION: Negative.   PATIENT SURVEYS:  LEFS 51/80   SCREENING FOR RED FLAGS: negative   COGNITION:           Overall cognitive status: Within functional limits for tasks assessed                          SENSATION:  LT increased on RLE vs LLE in all dermatomes     POSTURE: No Significant postural limitations   PALPATION: Deferred on eval due to time constraints 07/02/22: TTP R gastroc, anterior tibialis, and distal quad, same on LLE but reduced tenderness   LUMBAR ROM:    Active  A/PROM  eval  Flexion WFL  Extension WFL  Right rotation WFL  Left rotation WFL   (Blank rows = not tested) Comments: No pain with lumbar ROM although pt does report stretching in abdomen w/ lumbar extension   LOWER EXTREMITY  MMT:     MMT Right eval Left eval  Hip flexion 3+ 3+  Hip abduction 5 5  Hip internal rotation 3+ 3+  Hip external rotation 3+ 3+  Knee flexion 4 4  Knee extension 4 4  Ankle dorsiflexion 4+, p! 4  Ankle inversion 4, p! 4  Ankle eversion 4 4   (Blank rows = not tested) Comments: Pain in thigh w B MMT, R more sensitive than L. Most discomfort in anterior/medial ankle with MMT     FUNCTIONAL TESTS:  Slump + on R negative on L    GAIT: Distance walked: within clinic Assistive device utilized: None Level of assistance: Complete Independence Comments: Gait mechanics grossly WNL, no evidence of instability or significant impairment although pt does report reduced tolerance to ambulation       TODAY'S TREATMENT  [facility] Adult PT Treatment:                                                DATE: 07/02/2022 Therapeutic Exercise: STS body weight 3x8 Sciatic nerve glides, seated 2x10 B  SLS w/ unilat UE support and CGA for safety, B 3x30sec   Manual Therapy: NA Neuromuscular re-ed: NA Therapeutic Activity: NA Modalities: NA Self Care: NA      PATIENT EDUCATION:  Education details: rationale for interventions throughout, discussed PT impairments and plan of care, encouraged HEP performance Person educated: Patient Education method: Explanation, Demonstration, Tactile cues, Verbal cues Education comprehension: verbalized understanding, returned demonstration, verbal cues required, tactile cues required, and needs further education      HOME EXERCISE PROGRAM: Access Code: [email or web address] URL: [email or web address] Date: 06/21/2022 Prepared by: Fransisco Hertz   Exercises - Seated Sciatic Tensioner  - 1 x daily - 7 x weekly - 3 sets - 10 reps   ASSESSMENT:   CLINICAL IMPRESSION: Pt is a 26 y.o woman arriving to PT on this date for follow up w/ chronic RLE pain since gym injury about a year ago. Reduced time in session today due to late arrival. Session initiated with  sciatic nerve glides with no significant increase in pain. Able to progress for initiation of closed chain LE stability/strength exercises - noted instability with SLS initially but improves with repetition and cues for posture. Pt remains limited by muscular fatigue, RLE pain/weakness, and postural instability. Pt reports muscle soreness/fatigue as session progresses but denies overt increase in pain. Does require increased time/rest breaks with activities due to report of exercise induced asthma, in NAD throughout and tolerates well with rest breaks although pt does self administer inhaler 1x mid session. Recommend skilled PT to address aforementioned deficits to maximize functional independence and tolerance. Pt departs today's session in no acute distress, all voiced questions/concerns addressed appropriately from PT perspective.       OBJECTIVE IMPAIRMENTS  decreased activity tolerance, decreased endurance, decreased mobility, difficulty walking, decreased strength, and pain.    ACTIVITY LIMITATIONS carrying, lifting, sitting, standing, squatting, sleeping, and toileting   PARTICIPATION LIMITATIONS: cleaning, laundry, driving, and community activity   PERSONAL FACTORS Past/current experiences and Time since onset of injury/illness/exacerbation are also affecting patient's functional outcome.    REHAB POTENTIAL: Good   CLINICAL DECISION MAKING: Evolving/moderate complexity   EVALUATION COMPLEXITY: Moderate     GOALS: Goals reviewed with patient? Yes   SHORT TERM GOALS: Target date: 07/19/2022   Pt will demonstrate appropriate understanding and performance of initially prescribed HEP in order to facilitate improved independence with management of symptoms.  Baseline: HEP provided on eval Goal status: INITIAL    2.  Pt will score >60/80 on LEFS in order to indicate improved perception of function due to symptoms.  Baseline: 51/80 Goal status: INITIAL     LONG TERM GOALS: Target date:  08/16/2022   Pt will score 70 or above on LEFS in order to demonstrate improved perception of functional status due to symptoms.  Baseline: 51/80 Goal status: INITIAL   2.  Pt will demonstrate B hip ER/IR MMT of 4/5 in order to demonstrate improved strength for functional movements.  Baseline: 3+/5 B  Goal status: INITIAL   3. Pt will report ability to walk/stand for >29min with less than 4/10 pain in order to maximize tolerance to community and social activities.  Baseline: 10-15 min Goal status: INITIAL    4. Pt will report ability to sit for >1hr with less than 4/10 pain in order to promote tolerance to driving for family activities.             Baseline:            Goal status: INITIAL     PLAN: PT FREQUENCY: 1-2x/week (plan for 2x/week 4 weeks, 1x/week 4 weeks)   PT DURATION: 8 weeks   PLANNED INTERVENTIONS: Therapeutic exercises, Therapeutic activity, Neuromuscular re-education, Balance training, Gait training, Patient/Family education, Self Care, Joint mobilization, Joint manipulation, Stair training, Aquatic Therapy, Dry Needling, Spinal manipulation, Spinal mobilization, Cryotherapy, Moist heat, Manual therapy, and Re-evaluation.   PLAN FOR NEXT SESSION: Progress mobility/stability exercises as able/appropriate, incorporate yoga/pilates oriented exercise as able/appropriate per pt preference    Ashley Murrain PT, DPT 07/02/2022 10:21 AM

## 2022-07-02 ENCOUNTER — Ambulatory Visit: Payer: Medicaid Other | Admitting: Physical Therapy

## 2022-07-02 ENCOUNTER — Encounter: Payer: Self-pay | Admitting: Physical Therapy

## 2022-07-02 DIAGNOSIS — F3132 Bipolar disorder, current episode depressed, moderate: Secondary | ICD-10-CM | POA: Diagnosis not present

## 2022-07-02 DIAGNOSIS — M79651 Pain in right thigh: Secondary | ICD-10-CM

## 2022-07-02 DIAGNOSIS — M6281 Muscle weakness (generalized): Secondary | ICD-10-CM

## 2022-07-02 DIAGNOSIS — M25571 Pain in right ankle and joints of right foot: Secondary | ICD-10-CM

## 2022-07-02 DIAGNOSIS — R262 Difficulty in walking, not elsewhere classified: Secondary | ICD-10-CM

## 2022-07-03 NOTE — Therapy (Signed)
OUTPATIENT PHYSICAL THERAPY TREATMENT NOTE   Patient Name: Kaitlyn Crane MRN: [id number] DOB:Feb 26, 1996, 26 y.o., female Today's Date: 07/04/2022  PCP: [name], [place]   REFERRING PROVIDER: [name], FNP  END OF SESSION:   PT End of Session - 07/04/22 1545     Visit Number 3    Number of Visits 13    Date for PT Re-Evaluation 08/16/22    Authorization Type [employer] MCD    Authorization - Visit Number 3    Authorization - Number of Visits 27    PT Start Time [id number]    PT Stop Time [id number]    PT Time Calculation (min) 45 min    Activity Tolerance Patient tolerated treatment well;Patient limited by fatigue    Behavior During Therapy [facility] for tasks assessed/performed              Past Medical History:  Diagnosis Date   Infection 10/15/2010   UTERUS ?   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   Encounter for insertion of mirena IUD - inserted 12/12/12 12/12/2012   SVD (spontaneous vaginal delivery) 09/23/2012   Chorioamnionitis 09/23/2012   Late prenatal care 08/27/2012   Normal pregnancy, first 08/11/2012   Anemia 09/17/2011    REFERRING DIAG: REFERRING DIAG: M79.604,M79.605 (ICD-10-CM) - Pain in both lower extremities  THERAPY DIAG:  Pain in right thigh  Pain in right ankle and joints of right foot  Muscle weakness (generalized)  Difficulty in walking, not elsewhere classified  Rationale for Evaluation and Treatment  Rehabilitation  PERTINENT HISTORY: Exercise induced asthma, pt also reports she received epidural 9 years ago without effect during delivery of child  PRECAUTIONS: none  SUBJECTIVE: Pt arrives and reports some soreness after last session but also has been door dashing more and that may be contributing, otherwise no new updates. Pt pleasant and agreeable for PT session.   PAIN: Are you having pain: yes, currently mostly in R ankle 7/10 and 4 in knee Location: RLE, lateral thigh down to ankle How would you describe your pain? Aching, tingling Best: 4/10 Worst: 9/10 Aggravating factors: standing/walking >10-16min, sitting >23min, driving, working out Easing factors: ice, heat, icy hot lying down/resting    OBJECTIVE: (objective measures completed at initial evaluation unless otherwise dated)   DIAGNOSTIC FINDINGS:  Lumbar Xray 05/15/2022 FINDINGS: Normal anatomic alignment. No evidence for acute fracture or dislocation. Preservation of the vertebral body and intervertebral disc space heights. SI joints unremarkable. Intrauterine device is present.   IMPRESSION: Negative.   PATIENT SURVEYS:  LEFS 51/80   SCREENING FOR RED FLAGS: negative   COGNITION:           Overall cognitive status: Within functional limits for tasks assessed                          SENSATION: LT increased on RLE vs LLE in all dermatomes     POSTURE:  No Significant postural limitations   PALPATION: Deferred on eval due to time constraints 07/02/22: TTP R gastroc, anterior tibialis, and distal quad, same on LLE but reduced tenderness   LUMBAR ROM:    Active  A/PROM  eval  Flexion WFL  Extension WFL  Right rotation WFL  Left rotation WFL   (Blank rows = not tested) Comments: No pain with lumbar ROM although pt does report stretching in abdomen w/ lumbar extension   LOWER EXTREMITY MMT:     MMT Right eval Left eval  Hip flexion 3+ 3+  Hip abduction 5 5  Hip internal rotation 3+ 3+  Hip  external rotation 3+ 3+  Knee flexion 4 4  Knee extension 4 4  Ankle dorsiflexion 4+, p! 4  Ankle inversion 4, p! 4  Ankle eversion 4 4   (Blank rows = not tested) Comments: Pain in thigh w B MMT, R more sensitive than L. Most discomfort in anterior/medial ankle with MMT     FUNCTIONAL TESTS:  Slump + on R negative on L    GAIT: Distance walked: within clinic Assistive device utilized: None Level of assistance: Complete Independence Comments: Gait mechanics grossly WNL, no evidence of instability or significant impairment although pt does report reduced tolerance to ambulation       TODAY'S TREATMENT  [facility] Adult PT Treatment:                                                DATE: 07/04/22 Therapeutic Exercise: STS body weight w/ BTB around knees 3x8,  SLS w/ unilat UE support and CGA for safety, B 3x30sec  Seated theraband resisted PF 3x8 Gastroc stretch w slant board - x15 w 5 sec hold, cues for appropriate ROM   Neuromuscular re-ed: Standing sciatic nerve glides (triple flex/ext) x8, 2x5with UE support, cues for form and control Bridge + ball squeeze x10 for improved lumbopelvic stability, cues for form     [facility] Adult PT Treatment:                                                DATE: 07/02/2022 Therapeutic Exercise: STS body weight 3x8 Sciatic nerve glides, seated 2x10 B  SLS w/ unilat UE support and CGA for safety, B 3x30sec       PATIENT EDUCATION:  Education details: rationale for interventions, progress with PT thus far, HEP performance Person educated: Patient Education method: Explanation, Demonstration, Tactile cues, Verbal cues Education comprehension: verbalized understanding, returned demonstration, verbal cues required, tactile cues required, and needs further education      HOME EXERCISE PROGRAM: Access Code: [email or web address] URL: [email or web address] Date: 06/21/2022 Prepared by: Enis Slipper   Exercises - Seated Sciatic Tensioner  - 1 x  daily - 7 x weekly - 3 sets - 10 reps   ASSESSMENT:   CLINICAL IMPRESSION: Pt is a 26 y.o woman arriving to PT on this date for follow up w/ chronic RLE pain since gym injury about a year ago. Today's session emphasizing closed chain lower limb stability and improved lumbopelvic stability - improved tolerance to exercise on this date with reduced rest breaks. Continues to require cues for form and pacing. With bridges, noted reduction in low back symptoms with  addition of ball squeeze for increased lumbopelvic stability. Pt with report of increased muscular fatigue/burning but denies overt increase in pain throughout session. Pt continues to demonstrate limitations in LE strength, core strength, and postural instability.   Recommend skilled PT to address aforementioned deficits to maximize functional independence and tolerance. Pt departs today's session in no acute distress, all voiced questions/concerns addressed appropriately from PT perspective.        OBJECTIVE IMPAIRMENTS decreased activity tolerance, decreased endurance, decreased mobility, difficulty walking, decreased strength, and pain.    ACTIVITY LIMITATIONS carrying, lifting, sitting, standing, squatting, sleeping, and toileting   PARTICIPATION LIMITATIONS: cleaning, laundry, driving, and community activity   PERSONAL FACTORS Past/current experiences and Time since onset of injury/illness/exacerbation are also affecting patient's functional outcome.    REHAB POTENTIAL: Good   CLINICAL DECISION MAKING: Evolving/moderate complexity   EVALUATION COMPLEXITY: Moderate     GOALS: Goals reviewed with patient? Yes   SHORT TERM GOALS: Target date: 07/19/2022   Pt will demonstrate appropriate understanding and performance of initially prescribed HEP in order to facilitate improved independence with management of symptoms.  Baseline: HEP provided on eval Goal status: INITIAL    2.  Pt will score >60/80 on LEFS in order to indicate  improved perception of function due to symptoms.  Baseline: 51/80 Goal status: INITIAL     LONG TERM GOALS: Target date: 08/16/2022   Pt will score 70 or above on LEFS in order to demonstrate improved perception of functional status due to symptoms.  Baseline: 51/80 Goal status: INITIAL   2.  Pt will demonstrate B hip ER/IR MMT of 4/5 in order to demonstrate improved strength for functional movements.  Baseline: 3+/5 B  Goal status: INITIAL   3. Pt will report ability to walk/stand for >28min with less than 4/10 pain in order to maximize tolerance to community and social activities.  Baseline: 10-15 min Goal status: INITIAL    4. Pt will report ability to sit for >1hr with less than 4/10 pain in order to promote tolerance to driving for family activities.             Baseline: 19min            Goal status: INITIAL     PLAN: PT FREQUENCY: 1-2x/week (plan for 2x/week 4 weeks, 1x/week 4 weeks)   PT DURATION: 8 weeks   PLANNED INTERVENTIONS: Therapeutic exercises, Therapeutic activity, Neuromuscular re-education, Balance training, Gait training, Patient/Family education, Self Care, Joint mobilization, Joint manipulation, Stair training, Aquatic Therapy, Dry Needling, Spinal manipulation, Spinal mobilization, Cryotherapy, Moist heat, Manual therapy, and Re-evaluation.   PLAN FOR NEXT SESSION: Update HEP. Progress open/closed chain LE stability/strengthening, core activation exercises as able/appropriate.     Leeroy Cha PT, DPT 07/04/2022 4:42 PM

## 2022-07-04 ENCOUNTER — Encounter: Payer: Self-pay | Admitting: Physical Therapy

## 2022-07-04 ENCOUNTER — Ambulatory Visit: Payer: Medicaid Other | Admitting: Physical Therapy

## 2022-07-04 ENCOUNTER — Ambulatory Visit: Payer: Self-pay | Admitting: Internal Medicine

## 2022-07-04 DIAGNOSIS — M79651 Pain in right thigh: Secondary | ICD-10-CM

## 2022-07-04 DIAGNOSIS — M6281 Muscle weakness (generalized): Secondary | ICD-10-CM

## 2022-07-04 DIAGNOSIS — F331 Major depressive disorder, recurrent, moderate: Secondary | ICD-10-CM | POA: Diagnosis not present

## 2022-07-04 DIAGNOSIS — M25571 Pain in right ankle and joints of right foot: Secondary | ICD-10-CM

## 2022-07-04 DIAGNOSIS — R262 Difficulty in walking, not elsewhere classified: Secondary | ICD-10-CM

## 2022-07-06 NOTE — Therapy (Signed)
OUTPATIENT PHYSICAL THERAPY TREATMENT NOTE   Patient Name: Kaitlyn Crane MRN: [id number] DOB:10/29/1995, 26 y.o., female Today's Date: 07/09/2022  PCP: [name], [place]   REFERRING PROVIDER: [name], FNP  END OF SESSION:   PT End of Session - 07/09/22 0848     Visit Number 4    Number of Visits 13    Date for PT Re-Evaluation 08/16/22    Authorization Type [employer] MCD    Authorization - Visit Number 4    Authorization - Number of Visits 27    Progress Note Due on Visit 10    PT Start Time 0850    PT Stop Time 0932    PT Time Calculation (min) 42 min    Activity Tolerance Patient tolerated treatment well;Patient limited by fatigue    Behavior During Therapy [facility] for tasks assessed/performed               Past Medical History:  Diagnosis Date   Infection 10/15/2010   UTERUS ?   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   Encounter for insertion of mirena IUD - inserted 12/12/12 12/12/2012   SVD (spontaneous vaginal delivery) 09/23/2012   Chorioamnionitis 09/23/2012   Late prenatal care 08/27/2012   Normal pregnancy, first 08/11/2012   Anemia 09/17/2011    REFERRING DIAG: REFERRING DIAG: M79.604,M79.605 (ICD-10-CM) - Pain in both lower extremities  THERAPY DIAG:  Pain in right thigh  Pain in right ankle and joints of right foot  Muscle weakness (generalized)  Difficulty in walking, not elsewhere classified  Rationale for  Evaluation and Treatment Rehabilitation  PERTINENT HISTORY: Exercise induced asthma, pt also reports she received epidural 9 years ago without effect during delivery of child  PRECAUTIONS: none  SUBJECTIVE: Pt arrives with increased pain that she attributes to work activities, denies significant change in pain/soreness after last session. States she feels like working gas pedal bothers her foot most. Pt states overall the intensity of the pain is about the same although she has noticed less burning pain in limb since beginning therapy. Otherwise no new updates    PAIN: Are you having pain: yes, currently mostly in R ankle 7/10 Location: RLE, lateral thigh down to ankle How would you describe your pain? Aching, tingling Best in past week: 4/10 Worst in past week: 9/10 Aggravating factors: standing/walking >10-56min, sitting >87min, driving, working out Easing factors: ice, heat, icy hot lying down/resting    OBJECTIVE: (objective measures completed at initial evaluation unless otherwise dated)   DIAGNOSTIC FINDINGS:  Lumbar Xray 05/15/2022 FINDINGS: Normal anatomic alignment. No evidence for acute fracture or dislocation. Preservation of the vertebral body and intervertebral disc space heights. SI joints unremarkable. Intrauterine device is present.   IMPRESSION: Negative.   PATIENT SURVEYS:  LEFS 51/80   SCREENING FOR RED FLAGS: negative   COGNITION:           Overall cognitive status: Within functional limits for tasks assessed  SENSATION: LT increased on RLE vs LLE in all dermatomes     POSTURE: No Significant postural limitations   PALPATION: Deferred on eval due to time constraints 07/02/22: TTP R gastroc, anterior tibialis, and distal quad, same on LLE but reduced tenderness   LUMBAR ROM:    Active  A/PROM  eval  Flexion WFL  Extension WFL  Right rotation WFL  Left rotation WFL   (Blank rows = not tested) Comments: No pain with  lumbar ROM although pt does report stretching in abdomen w/ lumbar extension   LOWER EXTREMITY MMT:     MMT Right eval Left eval  Hip flexion 3+ 3+  Hip abduction 5 5  Hip internal rotation 3+ 3+  Hip external rotation 3+ 3+  Knee flexion 4 4  Knee extension 4 4  Ankle dorsiflexion 4+, p! 4  Ankle inversion 4, p! 4  Ankle eversion 4 4   (Blank rows = not tested) Comments: Pain in thigh w B MMT, R more sensitive than L. Most discomfort in anterior/medial ankle with MMT     FUNCTIONAL TESTS:  Slump + on R negative on L    GAIT: Distance walked: within clinic Assistive device utilized: None Level of assistance: Complete Independence Comments: Gait mechanics grossly WNL, no evidence of instability or significant impairment although pt does report reduced tolerance to ambulation       TODAY'S TREATMENT  [facility] Adult PT Treatment:                                                DATE: 07/09/22 Therapeutic Exercise: STS body weight 2 x10,  Seated theraband 3 way (PF,ever/inv) x8 each Gastroc stretch w slant board - 2x15 w 2-3 sec hold, cues for appropriate ROM  Neuromuscular re-ed: Standing sciatic nerve glides RLE only (triple flex/ext) 2x5 with UE support, cues for form and control Bridge + ball squeeze x10 for improved lumbopelvic stability, cues for form Lunge onto airex pad, 2x12 B w CGA for improved closed chain lower limb stability    [facility] Adult PT Treatment:                                                DATE: 07/04/22 Therapeutic Exercise: STS body weight w/ BTB around knees 3x8,  SLS w/ unilat UE support and CGA for safety, B 3x30sec  Seated theraband resisted PF 3x8 Gastroc stretch w slant board - x15 w 5 sec hold, cues for appropriate ROM   Neuromuscular re-ed: Standing sciatic nerve glides (triple flex/ext) x8, 2x5with UE support, cues for form and control Bridge + ball squeeze x10 for improved lumbopelvic stability, cues for form     [facility] Adult PT  Treatment:                                                DATE: 07/02/2022 Therapeutic Exercise: STS body weight 3x8 Sciatic nerve glides, seated 2x10 B  SLS w/ unilat UE support and CGA for safety, B 3x30sec       PATIENT EDUCATION:  Education details: rationale for interventions, progress  with PT thus far, HEP performance Person educated: Patient Education method: Explanation, Demonstration, Tactile cues, Verbal cues Education comprehension: verbalized understanding, returned demonstration, verbal cues required, tactile cues required, and needs further education      HOME EXERCISE PROGRAM: Access Code: [email or web address] URL: [email or web address] Date: 06/21/2022 Prepared by: Fransisco Hertz   Exercises - Seated Sciatic Tensioner  - 1 x daily - 7 x weekly - 3 sets - 10 reps   ASSESSMENT:   CLINICAL IMPRESSION: Pt is a 26 y.o woman arriving to PT on this date for follow up w/ chronic RLE pain since gym injury about a year ago.  Today's session emphasizing closed chain LE stability and ankle strength/stability. Able to progress for reduced rest breaks overall, increased time spent in weight bearing, and introduction of closed chain exercises on unstable surfaces. Also added ankle specific strengthening with theraband - pt requires increased rest breaks with strengthening exercises due to fatigue but tolerates well overall. Pt continues to demonstrate limitations in lower limb stability, strength, and mobility that are likely contributing to pain . Departs with report of improved ankle pain to 5/10 on NPS (7/10 on arrival) and muscular fatigue, states she enjoyed ankle specific strengthening.   Recommend skilled PT to address aforementioned deficits to maximize functional independence and tolerance. Pt departs today's session in no acute distress, all voiced questions/concerns addressed appropriately from PT perspective.         OBJECTIVE IMPAIRMENTS decreased activity tolerance,  decreased endurance, decreased mobility, difficulty walking, decreased strength, and pain.    ACTIVITY LIMITATIONS carrying, lifting, sitting, standing, squatting, sleeping, and toileting   PARTICIPATION LIMITATIONS: cleaning, laundry, driving, and community activity   PERSONAL FACTORS Past/current experiences and Time since onset of injury/illness/exacerbation are also affecting patient's functional outcome.    REHAB POTENTIAL: Good   CLINICAL DECISION MAKING: Evolving/moderate complexity   EVALUATION COMPLEXITY: Moderate     GOALS: Goals reviewed with patient? Yes   SHORT TERM GOALS: Target date: 07/19/2022   Pt will demonstrate appropriate understanding and performance of initially prescribed HEP in order to facilitate improved independence with management of symptoms.  Baseline: HEP provided on eval Goal status: INITIAL    2.  Pt will score >60/80 on LEFS in order to indicate improved perception of function due to symptoms.  Baseline: 51/80 Goal status: INITIAL     LONG TERM GOALS: Target date: 08/16/2022   Pt will score 70 or above on LEFS in order to demonstrate improved perception of functional status due to symptoms.  Baseline: 51/80 Goal status: INITIAL   2.  Pt will demonstrate B hip ER/IR MMT of 4/5 in order to demonstrate improved strength for functional movements.  Baseline: 3+/5 B  Goal status: INITIAL   3. Pt will report ability to walk/stand for >18min with less than 4/10 pain in order to maximize tolerance to community and social activities.  Baseline: 10-15 min Goal status: INITIAL    4. Pt will report ability to sit for >1hr with less than 4/10 pain in order to promote tolerance to driving for family activities.             Baseline:            Goal status: INITIAL     PLAN: PT FREQUENCY: 1-2x/week (plan for 2x/week 4 weeks, 1x/week 4 weeks)   PT DURATION: 8 weeks   PLANNED INTERVENTIONS: Therapeutic exercises, Therapeutic activity,  Neuromuscular re-education, Balance training, Gait training, Patient/Family education, Self Care, Joint mobilization, Joint manipulation, Stair  training, Aquatic Therapy, Dry Needling, Spinal manipulation, Spinal mobilization, Cryotherapy, Moist heat, Manual therapy, and Re-evaluation.   PLAN FOR NEXT SESSION: Review/modify HEP. Incorporate more ankle specific strengthening/stability work    Ashley Murrainavid A Inga Noller PT, DPT 07/09/2022 9:36 AM

## 2022-07-09 ENCOUNTER — Encounter: Payer: Self-pay | Admitting: Physical Therapy

## 2022-07-09 ENCOUNTER — Ambulatory Visit: Payer: Medicaid Other | Admitting: Physical Therapy

## 2022-07-09 DIAGNOSIS — R262 Difficulty in walking, not elsewhere classified: Secondary | ICD-10-CM

## 2022-07-09 DIAGNOSIS — M25571 Pain in right ankle and joints of right foot: Secondary | ICD-10-CM

## 2022-07-09 DIAGNOSIS — M6281 Muscle weakness (generalized): Secondary | ICD-10-CM

## 2022-07-09 DIAGNOSIS — F331 Major depressive disorder, recurrent, moderate: Secondary | ICD-10-CM | POA: Diagnosis not present

## 2022-07-09 DIAGNOSIS — M79651 Pain in right thigh: Secondary | ICD-10-CM

## 2022-07-10 ENCOUNTER — Encounter: Payer: Medicaid Other | Admitting: Registered"

## 2022-07-10 ENCOUNTER — Encounter: Payer: Self-pay | Admitting: Registered"

## 2022-07-10 DIAGNOSIS — Z713 Dietary counseling and surveillance: Secondary | ICD-10-CM

## 2022-07-10 NOTE — Progress Notes (Signed)
Appointment start time: 3:13  Appointment end time: 3:55  Patient was seen on 07/10/2022 for nutrition counseling pertaining to disordered eating  Primary care provider: Jodi Mourning, FNP Therapist: Cheryll Cockayne (sees 2x/week; [place]; has appt 9/14 with therapist, and Pauline Good, MD-psychiatrist on 9/18)   ROI: N/A Any other medical team members: none   Assessment  Pt arrives with son. Pt reports psychiatrist says she has bipolar; now taking Latuda 20 mg. States she has physical therapy 2x/week and will see PCP this week as well.   States she has been working for [employer] and has been challenging with eating. States she has been so focused on working that she has not been eating like she should. States she was trying Pediasure from a family member and thought it was too sweet. States she was waiting for EBT benefits before buying Ensure, which was renewed 1 week ago on 9/19. States it has been hard for her to take time for herself to do the things she needs to do. 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   States meals at home are provided by her parents or sister and she has become comfortable with eating what they provide.   Previous appt: States she has low blood pressure at times. States her vision is challenged an has been happening since her visit to [employer]. Reports she was diagnosed with depression in 01/2022. State she was vegan for 3 years but she is no longer vegan but wants to return to this lifestyle. States she wants to add meat and increase weight. States her weight was 90 lbs and its back to 102 lbs. States the most she has weighed was 115 lbs. States she weighed 105 lbs when vegan. Reports her family (parents and siblings) were concerned about vegan lifestyle and wanted her to eat meat. States she decided to switch to vegan lifestyle after watching nutrition documentaries. Reports history of being called fat by ex-mother-in-law when she weighed 115  lbs.  Reports challenges with sleep; she is waking up prior to alarm going off in the mornings. States she was living with boyfriend and son in and then moved to another state, which didn't go well for them. States they argued a lot and she would intentionally starve herself. States she was dealing with financial stress. In 07/2021 - moved in cousin and got sick related to depression.  States she used to eat 1 meal day. 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 States she feels hungry and will not eat, wakes up hungry at times and will not eat. States she cooks with grape seed or avocado oil.   States she is looking for a new PCP. States she is seeing an Horticulturist, commercial and will see them at the end of August.   Lives with parents and 23 year old son. Receives EBT benefits $516/month and states this is not enough to provide for them.   Growth Metrics: Goal rate of weight gain:  0.5-1.0 lb/week  Eating history: Length of time: 1 years Previous treatments: none Goals for RD meetings: improve hair shedding, dizziness/lightheadedness, headaches, focus/concentration, cold intolerance   Weight history:  Highest weight: 115; while pregnant 129  Lowest weight: 90 Most consistent weight: 104-106 What would you like to weigh: no How has weight changed in the past year: weight loss  Medical Information:  Changes in hair, skin, nails since ED started: continued hair shedding, yellowish skin, brittle nails, easily bruised,  easily bleeds Chewing/swallowing difficulties: yes, tonsils will give trouble with swallowing Reflux or heartburn: no Trouble with teeth: has 6 cavities, has trouble with grinding teeth since childhood LMP without the use of hormones: 8/9  Weight at that point:  Effect of exercise on menses: none reported   Effect of hormones on menses: N/A; just had Mirena removed on 8/9, has had it for 9 years Constipation, diarrhea: has BM  3-4x/week Dizziness/lightheadedness: yes, a few times throughout the week when getting up too quickly Headaches/body aches: yes, sometimes; legs going to sleep easily Heart racing/chest pain: yes, recently since 07/2021 Mood: moody Sleep: 6 hrs/night; does not feel rested Focus/concentration: yes Cold intolerance: yes Vision changes: sometimes, blurred vision  Mental health diagnosis: depression, psychotic disorder, bipolar   Dietary assessment: A typical day consists of 3 meals and 1 snacks  Safe foods include: Ramen noodles, stir fry/saute meat & vegetables, white rice, Pha, noodles, hot soupy, burgers, fish, pork, chicken, watermelon  Avoided foods include: blue crab, condiments, fish filets, beef (limits to a few times a year), bread   24 hour recall: shrimp B: skipped  S (11 am): 2 mandarin oranges + banana L (2 pm): 2 ham and cheese roll ups + pink lemonade S: D (7 pm): fish + 2 c rice + tomatoes + water S (9 pm):   Beverages: water (2*16 oz; 32 oz), Thai tea, lemonade  Physical activity: none reported  What Methods Do You Use To Control Your Weight (Compensatory behaviors)?           Restricting   Estimated energy intake: 1600-1700 kcal  Estimated energy needs: 2000-2200 kcal 250-275 g CHO 150-165 g pro 44-49 g fat  Nutrition Diagnosis: NB-1.5 Disordered eating pattern As related to skipping meals.  As evidenced by pt reports intentionally skipping meals at times when depressed.  Intervention/Goals: Pt was educated and counseled on eating to nourish the body, signs/symptoms of not being adequately nourished, ways to increase nourishment, and meal/snack planning. Pt agreed with goals listed. Goals: - Aim to eat breakfast around 7 am, morning snack at 10 am, lunch at 1 pm, afternoon snack 4 pm, dinner at 7 pm.  - Breakfast can be: cereal + whole milk + yogurt.  Lunch can be: bowl (with rice, protein, salad, avocado).  Dinner can be: rice + protein +  fruit/vegetable + yogurt. - Snacks are Ensure.   Meal plan:    3 meals    2 snacks   Monitoring and Evaluation: Patient will follow up in 4 weeks.

## 2022-07-10 NOTE — Patient Instructions (Signed)
-   Aim to eat breakfast around 7 am, morning snack at 10 am, lunch at 1 pm, afternoon snack 4 pm, dinner at 7 pm.   - Breakfast can be: cereal + whole milk + yogurt.  Lunch can be: bowl (with rice, protein, salad, avocado).  Dinner can be: rice + protein + fruit/vegetable + yogurt.  - Snacks are Ensure.

## 2022-07-11 NOTE — Therapy (Signed)
OUTPATIENT PHYSICAL THERAPY TREATMENT NOTE   Patient Name: Kaitlyn Crane MRN: [id number] DOB:Jul 02, 1996, 26 y.o., female Today's Date: 07/12/2022  PCP: Olive Bass, FNP   REFERRING PROVIDER: Olive Bass, FNP  END OF SESSION:   PT End of Session - 07/12/22 0926     Visit Number 5    Number of Visits 13    Date for PT Re-Evaluation 08/16/22    Authorization Type [employer] MCD    Authorization - Visit Number 5    Authorization - Number of Visits 27    Progress Note Due on Visit 10    PT Start Time 0928    PT Stop Time 1012    PT Time Calculation (min) 44 min    Activity Tolerance Patient tolerated treatment well;Patient limited by fatigue    Behavior During Therapy [facility] for tasks assessed/performed                Past Medical History:  Diagnosis Date   Infection 10/15/2010   UTERUS ?   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   Encounter for insertion of mirena IUD - inserted 12/12/12 12/12/2012   SVD (spontaneous vaginal delivery) 09/23/2012   Chorioamnionitis 09/23/2012   Late prenatal care 08/27/2012   Normal pregnancy, first 08/11/2012   Anemia 09/17/2011    REFERRING DIAG: REFERRING DIAG: M79.604,M79.605 (ICD-10-CM) - Pain in both lower extremities  THERAPY DIAG:  Pain in right thigh  Pain in right ankle and joints of right foot  Muscle weakness (generalized)  Difficulty in walking, not elsewhere classified  Rationale for  Evaluation and Treatment Rehabilitation  PERTINENT HISTORY: Exercise induced asthma, pt also reports she received epidural 9 years ago without effect during delivery of child  PRECAUTIONS: none  SUBJECTIVE:  Pt arrives with 8/10 pain in ankle and soreness in low back, attributes it to driving for nearly 7 hours yesterday.  Mild soreness after last session but not significant per patient, eased up fairly quickly. No changes in HEP, states she has still been working a lot. No other new updates per pt.      PAIN: Are you having pain: yes, currently mostly in R ankle 8/10 Location: RLE, lateral thigh down to ankle How would you describe your pain? Aching, tingling Best in past week: 4/10 Worst in past week: 9/10 Aggravating factors: standing/walking >10-73min, sitting >57min, driving, working out Easing factors: ice, heat, icy hot lying down/resting    OBJECTIVE: (objective measures completed at initial evaluation unless otherwise dated)   DIAGNOSTIC FINDINGS:  Lumbar Xray 05/15/2022 FINDINGS: Normal anatomic alignment. No evidence for acute fracture or dislocation. Preservation of the vertebral body and intervertebral disc space heights. SI joints unremarkable. Intrauterine device is present.   IMPRESSION: Negative.   PATIENT SURVEYS:  LEFS 51/80   SCREENING FOR RED FLAGS: negative   COGNITION:           Overall cognitive status: Within functional limits for tasks assessed  SENSATION: LT increased on RLE vs LLE in all dermatomes     POSTURE: No Significant postural limitations   PALPATION: Deferred on eval due to time constraints 07/02/22: TTP R gastroc, anterior tibialis, and distal quad, same on LLE but reduced tenderness   LUMBAR ROM:    Active  A/PROM  eval  Flexion WFL  Extension WFL  Right rotation WFL  Left rotation WFL   (Blank rows = not tested) Comments: No pain with lumbar ROM although pt does report stretching in abdomen w/  lumbar extension   LOWER EXTREMITY MMT:     MMT Right eval Left eval  Hip flexion 3+ 3+  Hip abduction 5 5  Hip internal rotation 3+ 3+  Hip external rotation 3+ 3+  Knee flexion 4 4  Knee extension 4 4  Ankle dorsiflexion 4+, p! 4  Ankle inversion 4, p! 4  Ankle eversion 4 4   (Blank rows = not tested) Comments: Pain in thigh w B MMT, R more sensitive than L. Most discomfort in anterior/medial ankle with MMT     FUNCTIONAL TESTS:  Slump + on R negative on L    GAIT: Distance walked: within clinic Assistive device utilized: None Level of assistance: Complete Independence Comments: Gait mechanics grossly WNL, no evidence of instability or significant impairment although pt does report reduced tolerance to ambulation       TODAY'S TREATMENT  [facility] Adult PT Treatment:                                                DATE: 07/12/22 Therapeutic Exercise: STS 3x6 w 3# DB Seated theraband 3 way (PF/ev/inv) 2x8 each DF stretch with therapist assisted mobilization with strap, 2x10 at wall Heel raises 2x8 w unilat UE support  Neuromuscular re-ed: Standing triple flex/ext x8 UE support, cues for form and control Airex pad lunges 2x12 performed B w unilat UE support for improved closed chain stability    [facility] Adult PT Treatment:                                                DATE: 07/09/22 Therapeutic Exercise: STS body weight 2 x10,  Seated theraband 3 way (PF,ever/inv) x8 each Gastroc stretch w slant board - 2x15 w 2-3 sec hold, cues for appropriate ROM  Neuromuscular re-ed: Standing sciatic nerve glides RLE only (triple flex/ext) 2x5 with UE support, cues for form and control Bridge + ball squeeze x10 for improved lumbopelvic stability, cues for form Lunge onto airex pad, 2x12 B w CGA for improved closed chain lower limb stability      PATIENT EDUCATION:  Education details: rationale for interventions, PT progression, activity modifications/rest breaks as needed to  mitigate symptom severity Person educated: Patient Education method: Explanation, Demonstration, Tactile cues, Verbal cues Education comprehension: verbalized understanding, returned demonstration, verbal cues required, tactile cues required, and needs further education      HOME EXERCISE PROGRAM: Access Code: [email or web address] URL: [email or web address] Date: 06/21/2022 Prepared by: Fransisco Hertz   Exercises - Seated Sciatic Tensioner  - 1 x daily - 7 x weekly - 3 sets - 10 reps   ASSESSMENT:   CLINICAL IMPRESSION: Pt is a 26 y.o woman arriving to PT on  this date for follow up w/ chronic RLE pain since gym injury about a year ago.  Today's session emphasizing closed chain LE stability and open/closed chain ankle strength/stability. Pt requires intermittent cues to reduce compensations at hip and knee but reports improved tolerance to exercise with repetition. Does require increased rest breaks with progression of resistance for STS. Pt demonstrates limitations in LE stability/strength along with pain and muscular fatigue. Tolerates session relatively well with continued need for rest breaks. Pt departs with report of relief from stretching exercise, some muscular fatigue.   Pt departs today's session in no acute distress, all voiced questions/concerns addressed appropriately from PT perspective.      OBJECTIVE IMPAIRMENTS decreased activity tolerance, decreased endurance, decreased mobility, difficulty walking, decreased strength, and pain.    ACTIVITY LIMITATIONS carrying, lifting, sitting, standing, squatting, sleeping, and toileting   PARTICIPATION LIMITATIONS: cleaning, laundry, driving, and community activity   PERSONAL FACTORS Past/current experiences and Time since onset of injury/illness/exacerbation are also affecting patient's functional outcome.    REHAB POTENTIAL: Good   CLINICAL DECISION MAKING: Evolving/moderate complexity   EVALUATION COMPLEXITY: Moderate      GOALS: Goals reviewed with patient? Yes   SHORT TERM GOALS: Target date: 07/19/2022   Pt will demonstrate appropriate understanding and performance of initially prescribed HEP in order to facilitate improved independence with management of symptoms.  Baseline: HEP provided on eval Goal status: INITIAL    2.  Pt will score >60/80 on LEFS in order to indicate improved perception of function due to symptoms.  Baseline: 51/80 Goal status: INITIAL     LONG TERM GOALS: Target date: 08/16/2022   Pt will score 70 or above on LEFS in order to demonstrate improved perception of functional status due to symptoms.  Baseline: 51/80 Goal status: INITIAL   2.  Pt will demonstrate B hip ER/IR MMT of 4/5 in order to demonstrate improved strength for functional movements.  Baseline: 3+/5 B  Goal status: INITIAL   3. Pt will report ability to walk/stand for >52min with less than 4/10 pain in order to maximize tolerance to community and social activities.  Baseline: 10-15 min Goal status: INITIAL    4. Pt will report ability to sit for >1hr with less than 4/10 pain in order to promote tolerance to driving for family activities.             Baseline: 17min            Goal status: INITIAL     PLAN: PT FREQUENCY: 1-2x/week (plan for 2x/week 4 weeks, 1x/week 4 weeks)   PT DURATION: 8 weeks   PLANNED INTERVENTIONS: Therapeutic exercises, Therapeutic activity, Neuromuscular re-education, Balance training, Gait training, Patient/Family education, Self Care, Joint mobilization, Joint manipulation, Stair training, Aquatic Therapy, Dry Needling, Spinal manipulation, Spinal mobilization, Cryotherapy, Moist heat, Manual therapy, and Re-evaluation.   PLAN FOR NEXT SESSION: Update HEP with emphasis on incorporating into daily routine. Consider lumbar mobility/core exercises    Leeroy Cha PT, DPT 07/12/2022 11:02 AM

## 2022-07-12 ENCOUNTER — Encounter: Payer: Self-pay | Admitting: Physical Therapy

## 2022-07-12 ENCOUNTER — Encounter: Payer: Self-pay | Admitting: Family

## 2022-07-12 ENCOUNTER — Ambulatory Visit (INDEPENDENT_AMBULATORY_CARE_PROVIDER_SITE_OTHER): Payer: Medicaid Other | Admitting: Family

## 2022-07-12 ENCOUNTER — Ambulatory Visit: Payer: Medicaid Other | Admitting: Physical Therapy

## 2022-07-12 VITALS — BP 90/60 | HR 99 | Temp 98.1°F | Ht 60.0 in | Wt 112.0 lb

## 2022-07-12 DIAGNOSIS — M6281 Muscle weakness (generalized): Secondary | ICD-10-CM

## 2022-07-12 DIAGNOSIS — E559 Vitamin D deficiency, unspecified: Secondary | ICD-10-CM

## 2022-07-12 DIAGNOSIS — R5383 Other fatigue: Secondary | ICD-10-CM

## 2022-07-12 DIAGNOSIS — R262 Difficulty in walking, not elsewhere classified: Secondary | ICD-10-CM

## 2022-07-12 DIAGNOSIS — M79651 Pain in right thigh: Secondary | ICD-10-CM | POA: Diagnosis not present

## 2022-07-12 DIAGNOSIS — M25571 Pain in right ankle and joints of right foot: Secondary | ICD-10-CM

## 2022-07-12 DIAGNOSIS — E538 Deficiency of other specified B group vitamins: Secondary | ICD-10-CM

## 2022-07-12 DIAGNOSIS — F331 Major depressive disorder, recurrent, moderate: Secondary | ICD-10-CM | POA: Diagnosis not present

## 2022-07-12 NOTE — Progress Notes (Signed)
Kaitlyn Crane is a 26 y.o. female with the following history as recorded in EpicCare:  Patient Active Problem List   Diagnosis Date Noted   Allergic contact dermatitis due to other agents 06/13/2022   UTI (urinary tract infection) 02/05/2022   Vegan 02/05/2022   B12 deficiency 02/05/2022   Depression 02/05/2022   Abdominal pain 02/04/2022   Pain in pelvis 02/04/2022   Hypokalemia 02/04/2022   BMI less than 19,adult 02/04/2022   Psychotic disorder ([place]) 02/04/2022   Substance-induced disorder ([place]) 02/03/2022   Encounter for insertion of mirena IUD - inserted 12/12/12 12/12/2012   SVD (spontaneous vaginal delivery) 09/23/2012   Chorioamnionitis 09/23/2012   Late prenatal care 08/27/2012   Normal pregnancy, first 08/11/2012   Anemia 09/17/2011    Current Outpatient Medications  Medication Sig Dispense Refill   EPIPEN 2-PAK 0.3 MG/0.3ML SOAJ injection Inject 0.3 mg into the muscle as needed for anaphylaxis. 1 each 1   Multiple Vitamin (MULTIVITAMIN WITH MINERALS) TABS tablet Take 1 tablet by mouth daily.     mupirocin ointment (BACTROBAN) 2 % Apply 1 Application topically 2 (two) times daily as needed (extranal use only). 22 g 0   nitrofurantoin (MACRODANTIN) 50 MG capsule Take 50 mg by mouth daily.     VENTOLIN HFA 108 (90 Base) MCG/ACT inhaler Inhale 2 puffs into the lungs every 6 (six) hours as needed for wheezing or shortness of breath. 18 g 1   vitamin B-12 1000 MCG tablet Take 1 tablet (1,000 mcg total) by mouth daily.     Vitamin D, Ergocalciferol, (DRISDOL) 1.25 MG (50000 UNIT) CAPS capsule Take 1 capsule (50,000 Units total) by mouth every 7 (seven) days for 12 doses. 12 capsule 0   lurasidone (LATUDA) 20 MG TABS tablet Take by mouth. (Patient not taking: Reported on 07/12/2022)     No current facility-administered medications for this visit.    Allergies: Shellfish allergy, Carbamide peroxide, and Nickel sulfate [nickel]  Past Medical History:  Diagnosis Date   Infection  10/15/2010   UTERUS ?   No pertinent past medical history    Urticaria     Past Surgical History:  Procedure Laterality Date   NO PAST SURGERIES      Family History  Problem Relation Age of Onset   Stroke Other    Hypertension Other    Asthma Other    Alcohol abuse Neg Hx     Social History   Tobacco Use   Smoking status: Never   Smokeless tobacco: Never  Substance Use Topics   Alcohol use: No    Subjective:  Patient presents for follow up on chronic care needs; has been doing much better since last appointment; Has met with nutritionist and recognizing need to eat more complete diet; has switched from vegan diet and is feeling better;   Has been diagnosed with Bipolar disorder by psychiatrist; working with therapist and psychiatrist through [facility]; will be starting [place];     Objective:  Vitals:   07/12/22 1527  BP: 90/60  Pulse: 99  Temp: 98.1 F (36.7 C)  TempSrc: Oral  SpO2: 99%  Weight: 112 lb (50.8 kg)  Height: 5' (1.524 m)    General: Well developed, well nourished, in no acute distress  Skin : Warm and dry.  Head: Normocephalic and atraumatic  Lungs: Respirations unlabored; clear to auscultation bilaterally without wheeze, rales, rhonchi  CVS exam: normal rate and regular rhythm.  Neurologic: Alert and oriented; speech intact; face symmetrical; moves all extremities well;  CNII-XII intact without focal deficit   Assessment:  1. Other fatigue   2. B12 deficiency   3. Vitamin D deficiency     Plan:  Will update labs as requested by patient; congratulated patient on commitment to her health;  She will be moving to [place], [place] soon and will most likely plan to establish with new PCP;   No follow-ups on file.  Orders Placed This Encounter  Procedures   CBC with Differential/Platelet   Comp Met (CMET)   B12   Vitamin D (25 hydroxy)    Requested Prescriptions    No prescriptions requested or ordered in this encounter

## 2022-07-13 LAB — VITAMIN D 25 HYDROXY (VIT D DEFICIENCY, FRACTURES): VITD: 34.21 ng/mL (ref 30.00–100.00)

## 2022-07-13 LAB — COMPREHENSIVE METABOLIC PANEL
ALT: 15 U/L (ref 0–35)
AST: 16 U/L (ref 0–37)
Albumin: 4.8 g/dL (ref 3.5–5.2)
Alkaline Phosphatase: 34 U/L — ABNORMAL LOW (ref 39–117)
BUN: 18 mg/dL (ref 6–23)
CO2: 30 mEq/L (ref 19–32)
Calcium: 9.7 mg/dL (ref 8.4–10.5)
Chloride: 97 mEq/L (ref 96–112)
Creatinine, Ser: 0.78 mg/dL (ref 0.40–1.20)
GFR: 103.49 mL/min (ref >60.00)
Glucose, Bld: 84 mg/dL (ref 70–99)
Potassium: 3.6 mEq/L (ref 3.5–5.1)
Sodium: 135 mEq/L (ref 135–145)
Total Bilirubin: 0.5 mg/dL (ref 0.2–1.2)
Total Protein: 8.3 g/dL (ref 6.0–8.3)

## 2022-07-13 LAB — CBC WITH DIFFERENTIAL/PLATELET
Basophils Absolute: 0 10*3/uL (ref 0.0–0.1)
Basophils Relative: 0.5 % (ref 0.0–3.0)
Eosinophils Absolute: 0 10*3/uL (ref 0.0–0.7)
Eosinophils Relative: 0.4 % (ref 0.0–5.0)
HCT: 37.7 % (ref 36.0–46.0)
Hemoglobin: 12.2 g/dL (ref 12.0–15.0)
Lymphocytes Relative: 32.5 % (ref 12.0–46.0)
Lymphs Abs: 2.8 10*3/uL (ref 0.7–4.0)
MCHC: 32.2 g/dL (ref 30.0–36.0)
MCV: 81.1 fl (ref 78.0–100.0)
Monocytes Absolute: 0.4 10*3/uL (ref 0.1–1.0)
Monocytes Relative: 4.4 % (ref 3.0–12.0)
Neutro Abs: 5.3 10*3/uL (ref 1.4–7.7)
Neutrophils Relative %: 62.2 % (ref 43.0–77.0)
Platelets: 283 10*3/uL (ref 150.0–400.0)
RBC: 4.65 Mil/uL (ref 3.87–5.11)
RDW: 13 % (ref 11.5–15.5)
WBC: 8.5 10*3/uL (ref 4.0–10.5)

## 2022-07-13 LAB — VITAMIN B12: Vitamin B-12: 882 pg/mL (ref 211–911)

## 2022-07-13 NOTE — Therapy (Incomplete)
OUTPATIENT PHYSICAL THERAPY TREATMENT NOTE   Patient Name: Kaitlyn Crane MRN: [id number] DOB:1996/06/22, 25 y.o., female Today's Date: 07/13/2022  PCP: Olive Bass, FNP   REFERRING PROVIDER: Olive Bass, FNP  END OF SESSION:        Past Medical History:  Diagnosis Date   Infection 10/15/2010   UTERUS ?   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  Rationale for Evaluation and Treatment Rehabilitation  PERTINENT HISTORY: Exercise induced asthma, pt also reports she received epidural 9 years ago without effect during delivery of child  PRECAUTIONS: none  SUBJECTIVE:  ***   Pt arrives with 8/10 pain in ankle and soreness in low back, attributes it to driving for nearly 7 hours yesterday.  Mild soreness after last session but not significant per patient, eased up fairly quickly. No changes in HEP, states she has still been working a lot. No other new updates per pt.      PAIN: Are you having pain: yes, currently mostly in R ankle 8/10 Location: RLE, lateral thigh down to  ankle How would you describe your pain? Aching, tingling Best in past week: 4/10 Worst in past week: 9/10 Aggravating factors: standing/walking >10-5min, sitting >91min, driving, working out Easing factors: ice, heat, icy hot lying down/resting    OBJECTIVE: (objective measures completed at initial evaluation unless otherwise dated)   DIAGNOSTIC FINDINGS:  Lumbar Xray 05/15/2022 FINDINGS: Normal anatomic alignment. No evidence for acute fracture or dislocation. Preservation of the vertebral body and intervertebral disc space heights. SI joints unremarkable. Intrauterine device is present.   IMPRESSION: Negative.   PATIENT SURVEYS:  LEFS 51/80   SCREENING FOR RED FLAGS: negative   COGNITION:           Overall cognitive status: Within functional limits for tasks assessed                          SENSATION: LT increased on RLE vs LLE in all dermatomes     POSTURE: No Significant postural limitations   PALPATION: Deferred on eval due to time constraints 07/02/22: TTP R gastroc, anterior tibialis, and distal quad, same on LLE but reduced tenderness   LUMBAR ROM:    Active  A/PROM  eval  Flexion WFL  Extension WFL  Right rotation WFL  Left rotation WFL   (Blank rows = not tested) Comments: No pain with lumbar ROM although pt does report stretching in abdomen w/ lumbar extension   LOWER EXTREMITY MMT:     MMT Right eval Left eval  Hip flexion 3+ 3+  Hip abduction 5 5  Hip internal rotation 3+ 3+  Hip external rotation 3+ 3+  Knee flexion 4 4  Knee extension 4 4  Ankle dorsiflexion 4+, p! 4  Ankle inversion 4, p! 4  Ankle eversion 4 4   (Blank rows = not tested) Comments: Pain in thigh w B MMT, R more sensitive than L. Most discomfort in anterior/medial ankle with MMT     FUNCTIONAL TESTS:  Slump + on R negative on L    GAIT: Distance walked: within clinic Assistive device utilized: None Level of assistance: Complete Independence Comments: Gait  mechanics grossly WNL, no evidence of instability or significant impairment although pt does report reduced tolerance to ambulation       TODAY'S TREATMENT  [facility] Adult PT Treatment:                                                DATE: 07/16/22 Therapeutic Exercise: STS 3x8 w 3#DB GTB 3way ankle (PF/ev/inv) 2x8 each DF stretch w PT assisted posterior  mobilization using strap, 2x10  Neuromuscular re-ed: Heel raises w ball for improved post tib activation, cues for form and control Bosu lunge 2x8 w unilat UE support for improved closed chain stability, cues for form and pacing   [facility] Adult PT Treatment:                                                DATE: 07/12/22 Therapeutic Exercise: STS 3x6 w 3# DB Seated theraband 3 way (PF/ev/inv) 2x8 each DF stretch with therapist assisted mobilization with strap, 2x10 at wall Heel raises 2x8 w unilat UE support  Neuromuscular re-ed: Standing triple flex/ext x8 UE support, cues for form and control Airex pad lunges 2x12 performed B w unilat UE support for improved closed chain stability       PATIENT EDUCATION:  Education details: HEP review/update, rationale for interventions Person educated: Patient Education method: Explanation, Demonstration, Tactile cues, Verbal cues Education comprehension: verbalized understanding, returned demonstration, verbal cues required, tactile cues required, and needs further education      HOME EXERCISE PROGRAM: Access Code: [email or web address] URL: [email or web address] Date: 06/21/2022 Prepared by: Fransisco Hertz   Exercises - Seated Sciatic Tensioner  - 1 x daily - 7 x weekly - 3 sets - 10 reps   ASSESSMENT:   CLINICAL IMPRESSION: Pt is a 26 y.o woman arriving to PT on this date for follow up w/ chronic RLE pain since gym injury about a year ago.  ***   Pt is a 26 y.o woman arriving to PT on this date for follow up w/ chronic RLE pain since gym injury about a year ago.  Today's session  emphasizing closed chain LE stability and open/closed chain ankle strength/stability. Pt requires intermittent cues to reduce compensations at hip and knee but reports improved tolerance to exercise with repetition. Does require increased rest breaks with progression of resistance for STS. Pt demonstrates limitations in LE stability/strength along with pain and muscular fatigue. Tolerates session relatively well with continued need for rest breaks. Pt departs with report of relief from stretching exercise, some muscular fatigue.   Pt departs today's session in no acute distress, all voiced questions/concerns addressed appropriately from PT perspective.  OBJECTIVE IMPAIRMENTS decreased activity tolerance, decreased endurance, decreased mobility, difficulty walking, decreased strength, and pain.    ACTIVITY LIMITATIONS carrying, lifting, sitting, standing, squatting, sleeping, and toileting   PARTICIPATION LIMITATIONS: cleaning, laundry, driving, and community activity   PERSONAL FACTORS Past/current experiences and Time since onset of injury/illness/exacerbation are also affecting patient's functional outcome.    REHAB POTENTIAL: Good   CLINICAL DECISION MAKING: Evolving/moderate complexity   EVALUATION COMPLEXITY: Moderate     GOALS: Goals reviewed with patient? Yes   SHORT TERM GOALS: Target date: 07/19/2022   Pt will demonstrate appropriate understanding and performance of initially prescribed HEP in order to facilitate improved independence with management of symptoms.  Baseline: HEP provided on eval Goal status: INITIAL    2.  Pt will score >60/80 on LEFS in order to indicate improved perception of function due to symptoms.  Baseline: 51/80 Goal status: INITIAL     LONG TERM GOALS: Target date: 08/16/2022   Pt will score 70 or above on LEFS in order to demonstrate improved perception of functional status due to symptoms.  Baseline: 51/80 Goal status: INITIAL   2.  Pt  will demonstrate B hip ER/IR MMT of 4/5 in order to demonstrate improved strength for functional movements.  Baseline: 3+/5 B  Goal status: INITIAL   3. Pt will report ability to walk/stand for >67min with less than 4/10 pain in order to maximize tolerance to community and social activities.  Baseline: 10-15 min Goal status: INITIAL    4. Pt will report ability to sit for >1hr with less than 4/10 pain in order to promote tolerance to driving for family activities.             Baseline: 13min            Goal status: INITIAL     PLAN: PT FREQUENCY: 1-2x/week (plan for 2x/week 4 weeks, 1x/week 4 weeks)   PT DURATION: 8 weeks   PLANNED INTERVENTIONS: Therapeutic exercises, Therapeutic activity, Neuromuscular re-education, Balance training, Gait training, Patient/Family education, Self Care, Joint mobilization, Joint manipulation, Stair training, Aquatic Therapy, Dry Needling, Spinal manipulation, Spinal mobilization, Cryotherapy, Moist heat, Manual therapy, and Re-evaluation.   PLAN FOR NEXT SESSION: *** HEP UPDATE THIS SESSION    Leeroy Cha PT, DPT 07/13/2022 8:38 AM

## 2022-07-16 ENCOUNTER — Encounter: Payer: Self-pay | Admitting: Physical Therapy

## 2022-07-16 ENCOUNTER — Ambulatory Visit: Payer: Medicaid Other | Attending: Internal Medicine | Admitting: Physical Therapy

## 2022-07-16 DIAGNOSIS — R262 Difficulty in walking, not elsewhere classified: Secondary | ICD-10-CM | POA: Insufficient documentation

## 2022-07-16 DIAGNOSIS — M79651 Pain in right thigh: Secondary | ICD-10-CM | POA: Diagnosis not present

## 2022-07-16 DIAGNOSIS — M25571 Pain in right ankle and joints of right foot: Secondary | ICD-10-CM | POA: Insufficient documentation

## 2022-07-16 DIAGNOSIS — M5441 Lumbago with sciatica, right side: Secondary | ICD-10-CM | POA: Diagnosis present

## 2022-07-16 DIAGNOSIS — M6281 Muscle weakness (generalized): Secondary | ICD-10-CM | POA: Insufficient documentation

## 2022-07-16 DIAGNOSIS — F331 Major depressive disorder, recurrent, moderate: Secondary | ICD-10-CM | POA: Diagnosis not present

## 2022-07-18 ENCOUNTER — Encounter: Payer: Self-pay | Admitting: Physical Therapy

## 2022-07-18 ENCOUNTER — Ambulatory Visit: Payer: Medicaid Other | Admitting: Physical Therapy

## 2022-07-18 ENCOUNTER — Encounter: Payer: Self-pay | Admitting: *Deleted

## 2022-07-18 DIAGNOSIS — M25571 Pain in right ankle and joints of right foot: Secondary | ICD-10-CM

## 2022-07-18 DIAGNOSIS — R262 Difficulty in walking, not elsewhere classified: Secondary | ICD-10-CM

## 2022-07-18 DIAGNOSIS — M79651 Pain in right thigh: Secondary | ICD-10-CM | POA: Diagnosis not present

## 2022-07-18 DIAGNOSIS — M6281 Muscle weakness (generalized): Secondary | ICD-10-CM

## 2022-07-18 NOTE — Therapy (Signed)
OUTPATIENT PHYSICAL THERAPY TREATMENT NOTE   Patient Name: Kaitlyn Crane MRN: [id number] DOB:Oct 19, 1995, 26 y.o., female Today's Date: 07/18/2022  PCP: Olive Bass, FNP   REFERRING PROVIDER: Olive Bass, FNP  END OF SESSION:   PT End of Session - 07/18/22 1547     Visit Number 7    Number of Visits 13    Date for PT Re-Evaluation 08/16/22    Authorization Type [employer] M[employer]    Authorization - Visit Number 7    Authorization - Number of Visits 27    Progress Note Due on Visit 10    PT Start Time 1547    PT Stop Time 1629    PT Time Calculation (min) 42 min    Activity Tolerance Patient tolerated treatment well;Patient limited by fatigue    Behavior During Therapy [facility] for tasks assessed/performed                  Past Medical History:  Diagnosis Date   Infection 10/15/2010   UTERUS ?   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   Encounter for insertion of mirena IUD - inserted 12/12/12 12/12/2012   SVD (spontaneous vaginal delivery) 09/23/2012   Chorioamnionitis 09/23/2012   Late prenatal care 08/27/2012   Normal pregnancy, first 08/11/2012   Anemia 09/17/2011    REFERRING DIAG: REFERRING DIAG: M79.604,M79.605 (ICD-10-CM) - Pain in both lower extremities  THERAPY DIAG:  Pain in right thigh  Pain in right ankle and joints of right foot  Muscle weakness (generalized)  Difficulty in walking, not elsewhere classified  Rationale for  Evaluation and Treatment Rehabilitation  PERTINENT HISTORY: Exercise induced asthma, pt also reports she received epidural 9 years ago without effect during delivery of child  PRECAUTIONS: none  SUBJECTIVE:  Pt states that she has still been driving a lot and has had continued symptoms. Pt denies any increase in symptoms after last session. Pt states that incorproating exercises into routine has been difficult due to how busy she is. She notes that she feels exercises help initially but then driving and being so busy set her back.      PAIN: Are you having pain: yes, currently mostly in R ankle Location: RLE, lateral thigh down to ankle How would you describe your pain? Aching, tingling Best in past week: 4/10 Worst in past week: 9/10 Aggravating factors: standing/walking >10-60min, sitting >51min, driving, working out Easing factors: ice, heat, icy hot lying down/resting    OBJECTIVE: (objective measures completed at initial evaluation unless otherwise dated)   DIAGNOSTIC FINDINGS:  Lumbar Xray 05/15/2022 FINDINGS: Normal anatomic alignment. No evidence for acute fracture or dislocation. Preservation of the vertebral body and intervertebral disc space heights. SI joints unremarkable. Intrauterine device is present.   IMPRESSION: Negative.   PATIENT SURVEYS:  LEFS 51/80   SCREENING FOR RED FLAGS: negative   COGNITION:           Overall cognitive status: Within functional  limits for tasks assessed                          SENSATION: LT increased on RLE vs LLE in all dermatomes     POSTURE: No Significant postural limitations   PALPATION: Deferred on eval due to time constraints 07/02/22: TTP R gastroc, anterior tibialis, and distal quad, same on LLE but reduced tenderness   LUMBAR ROM:    Active  A/PROM  eval  Flexion WFL  Extension WFL  Right rotation WFL  Left rotation WFL   (Blank rows = not tested) Comments: No pain with lumbar ROM although pt does  report stretching in abdomen w/ lumbar extension   LOWER EXTREMITY MMT:     MMT Right eval Left eval  Hip flexion 3+ 3+  Hip abduction 5 5  Hip internal rotation 3+ 3+  Hip external rotation 3+ 3+  Knee flexion 4 4  Knee extension 4 4  Ankle dorsiflexion 4+, p! 4  Ankle inversion 4, p! 4  Ankle eversion 4 4   (Blank rows = not tested) Comments: Pain in thigh w B MMT, R more sensitive than L. Most discomfort in anterior/medial ankle with MMT     FUNCTIONAL TESTS:  Slump + on R negative on L    GAIT: Distance walked: within clinic Assistive device utilized: None Level of assistance: Complete Independence Comments: Gait mechanics grossly WNL, no evidence of instability or significant impairment although pt does report reduced tolerance to ambulation       TODAY'S TREATMENT  [facility] Adult PT Treatment:                                                DATE: 07/18/2022  Therapeutic Exercise: Slant board DF stretch x10 slight knee bend RLE fwd BTB 3 way ankle 2x5 Heel raises w ball for post tib, 2x10 Swiss ball rollout fwd only, seated, x12 (x12 without ball, LE support for home performance) STS from table with 3#, 2x10  Neuromuscular re-ed: Swiss ball push down standing on airex pad 2x10, for improved core activation and static postural stability, cues for breath control and posture Bridge + ball squeeze 2x8 for improved lumbopelvic stability  [facility] Adult PT Treatment:                                                DATE: 07/16/22 Therapeutic Exercise: STS 3x8 w 3#DB GTB 3way ankle (PF/ev/inv) 2x8 each DF stretch w PT assisted posterior  mobilization using strap, 2x10 Heel raises w ball for improved post tib strength, 2x8 cues for form and control Adductor isos 2x10 cues for positioning     PATIENT EDUCATION:  Education details: HEP review/update, rationale for interventions Person educated: Patient Education method: Explanation, Demonstration, Tactile cues, Verbal  cues Education comprehension: verbalized understanding, returned demonstration, verbal cues required, tactile cues required, and needs further education      HOME EXERCISE PROGRAM: Access Code: [email or web address] URL: [email or web address] Date: 07/16/2022 Prepared by: Fransisco Hertz  Exercises - Seated Ankle Eversion with Resistance  - 2-3 x daily - 7 x weekly - 1 sets - 8 reps - Seated Ankle Plantar Flexion with Resistance Loop  - 2-3  x daily - 7 x weekly - 1 sets - 8 reps - Seated Ankle Inversion with Resistance  - 2-3 x daily - 7 x weekly - 1 sets - 8 reps - Sit to Stand  - 2-3 x daily - 7 x weekly - 1 sets - 8 reps   ASSESSMENT:   CLINICAL IMPRESSION: Pt is a 26 y.o woman arriving to PT on this date for follow up w/ chronic RLE pain since gym injury about a year ago.  Session initiated with ankle mobility exercises, initial discomfort with knee straight but improves with slight knee bend. Able to progress for increased resistance with ankle strengthening exercises, tolerates well. Pt reports greatest relief from lumbar mobility exercises. Pt departs with report of improved pain compared to arrival. Pt remains limited by reduced lower limb stability and pain. Pt departs today's session in no acute distress, all voiced questions/concerns addressed appropriately from PT perspective.      OBJECTIVE IMPAIRMENTS decreased activity tolerance, decreased endurance, decreased mobility, difficulty walking, decreased strength, and pain.    ACTIVITY LIMITATIONS carrying, lifting, sitting, standing, squatting, sleeping, and toileting   PARTICIPATION LIMITATIONS: cleaning, laundry, driving, and community activity   PERSONAL FACTORS Past/current experiences and Time since onset of injury/illness/exacerbation are also affecting patient's functional outcome.    REHAB POTENTIAL: Good   CLINICAL DECISION MAKING: Evolving/moderate complexity   EVALUATION COMPLEXITY: Moderate     GOALS: Goals  reviewed with patient? Yes   SHORT TERM GOALS: Target date: 07/19/2022   Pt will demonstrate appropriate understanding and performance of initially prescribed HEP in order to facilitate improved independence with management of symptoms.  Baseline: HEP provided on eval Goal status: INITIAL    2.  Pt will score >60/80 on LEFS in order to indicate improved perception of function due to symptoms.  Baseline: 51/80 Goal status: INITIAL     LONG TERM GOALS: Target date: 08/16/2022   Pt will score 70 or above on LEFS in order to demonstrate improved perception of functional status due to symptoms.  Baseline: 51/80 Goal status: INITIAL   2.  Pt will demonstrate B hip ER/IR MMT of 4/5 in order to demonstrate improved strength for functional movements.  Baseline: 3+/5 B  Goal status: INITIAL   3. Pt will report ability to walk/stand for >15min with less than 4/10 pain in order to maximize tolerance to community and social activities.  Baseline: 10-15 min Goal status: INITIAL    4. Pt will report ability to sit for >1hr with less than 4/10 pain in order to promote tolerance to driving for family activities.             Baseline: 27min            Goal status: INITIAL     PLAN: PT FREQUENCY: 1-2x/week (plan for 2x/week 4 weeks, 1x/week 4 weeks)   PT DURATION: 8 weeks   PLANNED INTERVENTIONS: Therapeutic exercises, Therapeutic activity, Neuromuscular re-education, Balance training, Gait training, Patient/Family education, Self Care, Joint mobilization, Joint manipulation, Stair training, Aquatic Therapy, Dry Needling, Spinal manipulation, Spinal mobilization, Cryotherapy, Moist heat, Manual therapy, and Re-evaluation.   PLAN FOR NEXT SESSION: incorporate more core/LE strengthening exercises, consider hamstring exercises    Leeroy Cha PT, DPT 07/18/2022 4:37 PM

## 2022-07-19 DIAGNOSIS — F331 Major depressive disorder, recurrent, moderate: Secondary | ICD-10-CM | POA: Diagnosis not present

## 2022-07-20 NOTE — Therapy (Signed)
OUTPATIENT PHYSICAL THERAPY TREATMENT NOTE   Patient Name: Kaitlyn Crane MRN: [id number] DOB:1995/11/14, 26 y.o., female Today's Date: 07/23/2022  PCP: Olive Bass, FNP   REFERRING PROVIDER: Olive Bass, FNP  END OF SESSION:   PT End of Session - 07/23/22 0847     Visit Number 8    Number of Visits 13    Date for PT Re-Evaluation 08/16/22    Authorization Type [employer] MCD    Authorization - Visit Number 8    Authorization - Number of Visits 27    Progress Note Due on Visit 10    PT Start Time 0848    PT Stop Time 0927    PT Time Calculation (min) 39 min    Activity Tolerance Patient tolerated treatment well;Patient limited by fatigue    Behavior During Therapy [facility] for tasks assessed/performed                   Past Medical History:  Diagnosis Date   Infection 10/15/2010   UTERUS ?   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   Encounter for insertion of mirena IUD - inserted 12/12/12 12/12/2012   SVD (spontaneous vaginal delivery) 09/23/2012   Chorioamnionitis 09/23/2012   Late prenatal care 08/27/2012   Normal pregnancy, first 08/11/2012   Anemia 09/17/2011    REFERRING DIAG: REFERRING DIAG: M79.604,M79.605 (ICD-10-CM) - Pain in both lower extremities  THERAPY DIAG:  Pain in right thigh  Pain in right ankle and joints of right foot  Muscle weakness (generalized)  Difficulty in walking, not elsewhere classified  Rationale for  Evaluation and Treatment Rehabilitation  PERTINENT HISTORY: Exercise induced asthma, pt also reports she received epidural 9 years ago without effect during delivery of child  PRECAUTIONS: none  SUBJECTIVE:  Pt arrives with soreness after driving over the weekend, states that her ankle is feeling a bit better (6/10) but back pain is about an 8/10 from lots of sitting. Reports limited ability to do HEP due to being busy. Pt does note that she was able to walk/stand for significant amounts of time [place] the aqu[place] over the [place] without getting too tired or having increase in pain which she feels is a significant improvement. No other new updates.      PAIN: Are you having pain: yes, currently mostly in R ankle Location: RLE, lateral thigh down to ankle How would you describe your pain? Aching, tingling Best in past week: 4/10 Worst in past week: 9/10 Aggravating factors: standing/walking >10-67min, sitting >47min, driving, working out Easing factors: ice, heat, icy hot lying down/resting    OBJECTIVE: (objective measures completed at initial evaluation unless otherwise dated)   DIAGNOSTIC FINDINGS:  Lumbar Xray 05/15/2022 FINDINGS: Normal anatomic alignment. No evidence for acute fracture or dislocation. Preservation of the vertebral body and intervertebral disc space heights. SI joints unremarkable. Intrauterine device is present.   IMPRESSION: Negative.   PATIENT SURVEYS:  LEFS 51/80  SCREENING FOR RED FLAGS: negative   COGNITION:           Overall cognitive status: Within functional limits for tasks assessed                          SENSATION: LT increased on RLE vs LLE in all dermatomes     POSTURE: No Significant postural limitations   PALPATION: Deferred on eval due to time constraints 07/02/22: TTP R gastroc, anterior tibialis, and distal quad, same on LLE but reduced tenderness   LUMBAR ROM:    Active  A/PROM  eval  Flexion WFL  Extension WFL  Right  rotation WFL  Left rotation WFL   (Blank rows = not tested) Comments: No pain with lumbar ROM although pt does report stretching in abdomen w/ lumbar extension   LOWER EXTREMITY MMT:     MMT Right eval Left eval  Hip flexion 3+ 3+  Hip abduction 5 5  Hip internal rotation 3+ 3+  Hip external rotation 3+ 3+  Knee flexion 4 4  Knee extension 4 4  Ankle dorsiflexion 4+, p! 4  Ankle inversion 4, p! 4  Ankle eversion 4 4   (Blank rows = not tested) Comments: Pain in thigh w B MMT, R more sensitive than L. Most discomfort in anterior/medial ankle with MMT     FUNCTIONAL TESTS:  Slump + on R negative on L    GAIT: Distance walked: within clinic Assistive device utilized: None Level of assistance: Complete Independence Comments: Gait mechanics grossly WNL, no evidence of instability or significant impairment although pt does report reduced tolerance to ambulation       TODAY'S TREATMENT  [facility] Adult PT Treatment:                                                DATE: 07/23/22 Therapeutic Exercise: Heel raises w ball 2x8 BTB 4way x8 3# suitcase carry 2x6ft Swiss ball push down 2x10 standing Bridge + ball squeeze 2x8 Bent knee slant board DF stretch x10 Child's pose 2x30sec Swiss ball rollout 3 way Seated hamstring stretch 2x30 sec B LE  [facility] Adult PT Treatment:                                                DATE: 07/18/2022  Therapeutic Exercise: Slant board DF stretch x10 slight knee bend RLE fwd BTB 3 way ankle 2x5 Heel raises w ball for post tib, 2x10 Swiss ball rollout fwd only, seated, x12 (x12 without ball, LE support for home performance) STS from table with 3#, 2x10  Neuromuscular re-ed: Swiss ball push down standing on airex pad 2x10, for improved core activation and static postural stability, cues for breath control and posture Bridge + ball squeeze 2x8 for improved lumbopelvic stability     PATIENT EDUCATION:  Education details: HEP review/update, rationale  for interventions Person educated: Patient Education method: Explanation, Demonstration, Tactile cues, Verbal cues Education comprehension: verbalized understanding, returned demonstration, verbal cues required, tactile cues required, and needs further education      HOME EXERCISE PROGRAM: Access Code: [email or web address] URL: [email or web address] Date: 07/16/2022 Prepared by: Enis Slipper  Exercises - Seated Ankle Eversion with Resistance  -  2-3 x daily - 7 x weekly - 1 sets - 8 reps - Seated Ankle Plantar Flexion with Resistance Loop  - 2-3 x daily - 7 x weekly - 1 sets - 8 reps - Seated Ankle Inversion with Resistance  - 2-3 x daily - 7 x weekly - 1 sets - 8 reps - Sit to Stand  - 2-3 x daily - 7 x weekly - 1 sets - 8 reps   ASSESSMENT:   CLINICAL IMPRESSION: Pt is a 26 y.o woman arriving to PT on this date for follow up w/ chronic RLE pain since gym injury about a year ago. Pt arrives with improved ankle pain but increased low back pain she attributes to driving more over weekend. Initiated session with ankle exercises which pt tolerates well, addition of resisted DF exercises. More emphasis on lumbar mobility and core stability today which pt tolerates well, cues as needed for comfortable ROM and form. Pt departs with improved low back symptoms (6/10 NPS compared to 8/10 on arrival), stating that it "feels good" to "move it (referring to her back)". No overt change in ankle symptoms, primary report of muscle fatigue.     OBJECTIVE IMPAIRMENTS decreased activity tolerance, decreased endurance, decreased mobility, difficulty walking, decreased strength, and pain.    ACTIVITY LIMITATIONS carrying, lifting, sitting, standing, squatting, sleeping, and toileting   PARTICIPATION LIMITATIONS: cleaning, laundry, driving, and community activity   PERSONAL FACTORS Past/current experiences and Time since onset of injury/illness/exacerbation are also affecting patient's functional outcome.     REHAB POTENTIAL: Good   CLINICAL DECISION MAKING: Evolving/moderate complexity   EVALUATION COMPLEXITY: Moderate     GOALS: Goals reviewed with patient? Yes   SHORT TERM GOALS: Target date: 07/19/2022   Pt will demonstrate appropriate understanding and performance of initially prescribed HEP in order to facilitate improved independence with management of symptoms.  Baseline: HEP provided on eval Goal status: INITIAL    2.  Pt will score >60/80 on LEFS in order to indicate improved perception of function due to symptoms.  Baseline: 51/80 Goal status: INITIAL     LONG TERM GOALS: Target date: 08/16/2022   Pt will score 70 or above on LEFS in order to demonstrate improved perception of functional status due to symptoms.  Baseline: 51/80 Goal status: INITIAL   2.  Pt will demonstrate B hip ER/IR MMT of 4/5 in order to demonstrate improved strength for functional movements.  Baseline: 3+/5 B  Goal status: INITIAL   3. Pt will report ability to walk/stand for >31min with less than 4/10 pain in order to maximize tolerance to community and social activities.  Baseline: 10-15 min Goal status: INITIAL    4. Pt will report ability to sit for >1hr with less than 4/10 pain in order to promote tolerance to driving for family activities.             Baseline: 38min            Goal status: INITIAL     PLAN: PT FREQUENCY: 1-2x/week (plan for 2x/week 4 weeks, 1x/week 4 weeks)   PT DURATION: 8 weeks   PLANNED INTERVENTIONS: Therapeutic exercises, Therapeutic activity, Neuromuscular re-education, Balance training, Gait training, Patient/Family education, Self Care, Joint mobilization, Joint manipulation, Stair training, Aquatic Therapy, Dry Needling, Spinal manipulation, Spinal mobilization, Cryotherapy, Moist heat, Manual therapy, and Re-evaluation.   PLAN FOR NEXT SESSION: progress core/LE stability/mobility as able/appropriate    Leeroy Cha PT, DPT 07/23/2022 9:28 AM

## 2022-07-22 ENCOUNTER — Other Ambulatory Visit: Payer: Self-pay | Admitting: Family

## 2022-07-23 ENCOUNTER — Ambulatory Visit: Payer: Medicaid Other | Admitting: Physical Therapy

## 2022-07-23 ENCOUNTER — Encounter: Payer: Self-pay | Admitting: Physical Therapy

## 2022-07-23 DIAGNOSIS — M79651 Pain in right thigh: Secondary | ICD-10-CM | POA: Diagnosis not present

## 2022-07-23 DIAGNOSIS — M6281 Muscle weakness (generalized): Secondary | ICD-10-CM

## 2022-07-23 DIAGNOSIS — R262 Difficulty in walking, not elsewhere classified: Secondary | ICD-10-CM

## 2022-07-23 DIAGNOSIS — F331 Major depressive disorder, recurrent, moderate: Secondary | ICD-10-CM | POA: Diagnosis not present

## 2022-07-23 DIAGNOSIS — M25571 Pain in right ankle and joints of right foot: Secondary | ICD-10-CM

## 2022-07-26 ENCOUNTER — Ambulatory Visit: Payer: Medicaid Other | Admitting: Physical Therapy

## 2022-07-26 DIAGNOSIS — F331 Major depressive disorder, recurrent, moderate: Secondary | ICD-10-CM | POA: Diagnosis not present

## 2022-07-27 ENCOUNTER — Ambulatory Visit: Payer: Medicaid Other | Admitting: Physical Therapy

## 2022-07-30 ENCOUNTER — Ambulatory Visit: Payer: Medicaid Other | Admitting: Physical Therapy

## 2022-07-30 ENCOUNTER — Encounter: Payer: Self-pay | Admitting: Physical Therapy

## 2022-07-30 DIAGNOSIS — M79651 Pain in right thigh: Secondary | ICD-10-CM

## 2022-07-30 DIAGNOSIS — R262 Difficulty in walking, not elsewhere classified: Secondary | ICD-10-CM

## 2022-07-30 DIAGNOSIS — M6281 Muscle weakness (generalized): Secondary | ICD-10-CM

## 2022-07-30 DIAGNOSIS — M25571 Pain in right ankle and joints of right foot: Secondary | ICD-10-CM

## 2022-07-30 DIAGNOSIS — F331 Major depressive disorder, recurrent, moderate: Secondary | ICD-10-CM | POA: Diagnosis not present

## 2022-07-30 NOTE — Therapy (Signed)
OUTPATIENT PHYSICAL THERAPY TREATMENT NOTE   Patient Name: Kaitlyn Crane MRN: [id number] DOB:27-Jan-1996, 26 y.o., female Today's Date: 07/30/2022  PCP: [name], [place]   REFERRING PROVIDER: [name], FNP  END OF SESSION:   PT End of Session - 07/30/22 0846     Visit Number 9    Number of Visits 13    Date for PT Re-Evaluation 08/16/22    Authorization Type [employer] MCD    Authorization - Visit Number 9    Authorization - Number of Visits 27    Progress Note Due on Visit 10    PT Start Time 0847    PT Stop Time 0936    PT Time Calculation (min) 49 min    Activity Tolerance Patient tolerated treatment well;Patient limited by fatigue    Behavior During Therapy [facility] for tasks assessed/performed                    Past Medical History:  Diagnosis Date   Infection 10/15/2010   UTERUS ?   No pertinent past medical history    Urticaria    Past Surgical History:  Procedure Laterality Date   NO PAST SURGERIES     Patient Active Problem List   Diagnosis Date Noted   Allergic contact dermatitis due to other agents 06/13/2022   UTI (urinary tract infection) 02/05/2022   Vegan 02/05/2022   B12 deficiency 02/05/2022   Depression 02/05/2022   Abdominal pain 02/04/2022   Pain in pelvis 02/04/2022   Hypokalemia 02/04/2022   BMI less than 19,adult 02/04/2022   Psychotic disorder ([place]) 02/04/2022   Substance-induced disorder ([place]) 02/03/2022   Encounter for insertion of mirena IUD - inserted 12/12/12 12/12/2012   SVD (spontaneous vaginal delivery) 09/23/2012   Chorioamnionitis 09/23/2012   Late prenatal care 08/27/2012   Normal pregnancy, first 08/11/2012   Anemia 09/17/2011    REFERRING DIAG: REFERRING DIAG: M79.604,M79.605 (ICD-10-CM) - Pain in both lower extremities  THERAPY DIAG:  Pain in right thigh  Pain in right ankle and joints of right foot  Muscle weakness (generalized)  Difficulty in walking, not elsewhere classified  Rationale  for Evaluation and Treatment Rehabilitation  PERTINENT HISTORY: Exercise induced asthma, pt also reports she received epidural 9 years ago without effect during delivery of child  PRECAUTIONS: none  SUBJECTIVE:  Pt arrives and notes that she did have increased life stressors at end of last weekend/weekend. Denies any significant increase in pain after last session; pt states that she thinks the exercises last session helped significantly and she has about a 4/10 pain in ankle/back at present. No other new updates     PAIN: Are you having pain: yes, currently mostly in R ankle Location: RLE, lateral thigh down to ankle How would you describe your pain? Aching, tingling Best in past week: 4/10 Worst in past week: 9/10 Aggravating factors: standing/walking >10-3min, sitting >57min, driving, working out Easing factors: ice, heat, icy hot lying down/resting    OBJECTIVE: (objective measures completed at initial evaluation unless otherwise dated)   DIAGNOSTIC FINDINGS:  Lumbar Xray 05/15/2022 FINDINGS: Normal anatomic alignment. No evidence for acute fracture or dislocation. Preservation of the vertebral body and intervertebral disc space heights. SI joints unremarkable. Intrauterine device is present.   IMPRESSION: Negative.   PATIENT SURVEYS:  LEFS 51/80   SCREENING FOR RED FLAGS: negative   COGNITION:           Overall cognitive status: Within functional limits for tasks assessed  SENSATION: LT increased on RLE vs LLE in all dermatomes     POSTURE: No Significant postural limitations   PALPATION: Deferred on eval due to time constraints 07/02/22: TTP R gastroc, anterior tibialis, and distal quad, same on LLE but reduced tenderness   LUMBAR ROM:    Active  A/PROM  eval  Flexion WFL  Extension WFL  Right rotation WFL  Left rotation WFL   (Blank rows = not tested) Comments: No pain with lumbar ROM although pt does report stretching in  abdomen w/ lumbar extension   LOWER EXTREMITY MMT:     MMT Right eval Left eval  Hip flexion 3+ 3+  Hip abduction 5 5  Hip internal rotation 3+ 3+  Hip external rotation 3+ 3+  Knee flexion 4 4  Knee extension 4 4  Ankle dorsiflexion 4+, p! 4  Ankle inversion 4, p! 4  Ankle eversion 4 4   (Blank rows = not tested) Comments: Pain in thigh w B MMT, R more sensitive than L. Most discomfort in anterior/medial ankle with MMT     FUNCTIONAL TESTS:  Slump + on R negative on L    GAIT: Distance walked: within clinic Assistive device utilized: None Level of assistance: Complete Independence Comments: Gait mechanics grossly WNL, no evidence of instability or significant impairment although pt does report reduced tolerance to ambulation       TODAY'S TREATMENT  [facility] Adult PT Treatment:                                                DATE: 07/30/22 Therapeutic Exercise: Heel raises w ball 2x8 BTB 4way x10 5# suitcase carry 2x37ft 5# STS from lowest mat, 3x5 Swiss ball push down x8 fwd/sidebending, standing Bridge + ball squeeze 2x10 Bent knee slant board DF stretch x10 Child's pose 2x30sec Swiss ball rollout 3 way x8 Supine hamstring stretch w strap x8 BLE   [facility] Adult PT Treatment:                                                DATE: 07/23/22 Therapeutic Exercise: Heel raises w ball 2x8 BTB 4way x8 3# suitcase carry 2x74ft Swiss ball push down 2x10 standing Bridge + ball squeeze 2x8 Bent knee slant board DF stretch x10 Child's pose 2x30sec Swiss ball rollout 3 way Seated hamstring stretch 2x30 sec B LE  [facility] Adult PT Treatment:                                                DATE: 07/18/2022  Therapeutic Exercise: Slant board DF stretch x10 slight knee bend RLE fwd BTB 3 way ankle 2x5 Heel raises w ball for post tib, 2x10 Swiss ball rollout fwd only, seated, x12 (x12 without ball, LE support for home performance) STS from table with 3#, 2x10  Neuromuscular  re-ed: Swiss ball push down standing on airex pad 2x10, for improved core activation and static postural stability, cues for breath control and posture Bridge + ball squeeze 2x8 for improved lumbopelvic stability     PATIENT EDUCATION:  Education details: HEP  review/update, rationale for interventions Person educated: Patient Education method: Explanation, Demonstration, Tactile cues, Verbal cues Education comprehension: verbalized understanding, returned demonstration, verbal cues required, tactile cues required, and needs further education      HOME EXERCISE PROGRAM: Access Code: [email or web address] URL: [email or web address] Date: 07/16/2022 Prepared by: Enis Slipper  Exercises - Seated Ankle Eversion with Resistance  - 2-3 x daily - 7 x weekly - 1 sets - 8 reps - Seated Ankle Plantar Flexion with Resistance Loop  - 2-3 x daily - 7 x weekly - 1 sets - 8 reps - Seated Ankle Inversion with Resistance  - 2-3 x daily - 7 x weekly - 1 sets - 8 reps - Sit to Stand  - 2-3 x daily - 7 x weekly - 1 sets - 8 reps   ASSESSMENT:   CLINICAL IMPRESSION: Pt is a 26 y.o woman arriving to PT on this date for follow up w/ chronic RLE pain since gym injury about a year ago. Pt arrives with report of improvement in symptoms, states that she feels additional core/lumbar exercises have been helpful. Continued with familiar ankle/LE program with increased emphasis on core activation and lumbar mobility. Pt continues to require intermittent rest breaks, endorses muscular fatigue with activity but notes that ankle pain improves as session progresses (2/10 pain on departure compared to 4/10 on arrival, minimal change reported in back). No adverse symptoms noted. Pt departs today's session in no acute distress, all voiced questions/concerns addressed appropriately from PT perspective.       OBJECTIVE IMPAIRMENTS decreased activity tolerance, decreased endurance, decreased mobility, difficulty walking,  decreased strength, and pain.    ACTIVITY LIMITATIONS carrying, lifting, sitting, standing, squatting, sleeping, and toileting   PARTICIPATION LIMITATIONS: cleaning, laundry, driving, and community activity   PERSONAL FACTORS Past/current experiences and Time since onset of injury/illness/exacerbation are also affecting patient's functional outcome.    REHAB POTENTIAL: Good   CLINICAL DECISION MAKING: Evolving/moderate complexity   EVALUATION COMPLEXITY: Moderate     GOALS: Goals reviewed with patient? Yes   SHORT TERM GOALS: Target date: 07/19/2022   Pt will demonstrate appropriate understanding and performance of initially prescribed HEP in order to facilitate improved independence with management of symptoms.  Baseline: HEP provided on eval Goal status: INITIAL    2.  Pt will score >60/80 on LEFS in order to indicate improved perception of function due to symptoms.  Baseline: 51/80 Goal status: INITIAL     LONG TERM GOALS: Target date: 08/16/2022   Pt will score 70 or above on LEFS in order to demonstrate improved perception of functional status due to symptoms.  Baseline: 51/80 Goal status: INITIAL   2.  Pt will demonstrate B hip ER/IR MMT of 4/5 in order to demonstrate improved strength for functional movements.  Baseline: 3+/5 B  Goal status: INITIAL   3. Pt will report ability to walk/stand for >26min with less than 4/10 pain in order to maximize tolerance to community and social activities.  Baseline: 10-15 min Goal status: INITIAL    4. Pt will report ability to sit for >1hr with less than 4/10 pain in order to promote tolerance to driving for family activities.             Baseline: 83min            Goal status: INITIAL     PLAN: PT FREQUENCY: 1-2x/week (plan for 2x/week 4 weeks, 1x/week 4 weeks)   PT DURATION: 8 weeks   PLANNED INTERVENTIONS: Therapeutic exercises, Therapeutic  activity, Neuromuscular re-education, Balance training, Gait training,  Patient/Family education, Self Care, Joint mobilization, Joint manipulation, Stair training, Aquatic Therapy, Dry Needling, Spinal manipulation, Spinal mobilization, Cryotherapy, Moist heat, Manual therapy, and Re-evaluation.   PLAN FOR NEXT SESSION:  progress note, update HEP/POC, incorporate more lumbar/core work (consider modification to child's pose as pt reports some transient ankle discomfort with positioning)    Leeroy Cha PT, DPT 07/30/2022 9:39 AM

## 2022-08-02 DIAGNOSIS — F331 Major depressive disorder, recurrent, moderate: Secondary | ICD-10-CM | POA: Diagnosis not present

## 2022-08-02 NOTE — Therapy (Signed)
OUTPATIENT PHYSICAL THERAPY PROGRESS NOTE/RECERTIFICATION   Patient Name: Kaitlyn Crane MRN: [id number] DOB:1996/03/02, 26 y.o., female Today's Date: 08/06/2022   Progress Note Reporting Period 06/21/2022 to 08/06/22  See note below for Objective Data and Assessment of Progress/Goals.      PCP: [name], FNP   REFERRING PROVIDER: [name], FNP  END OF SESSION:   PT End of Session - 08/06/22 0843     Visit Number 10    Number of Visits 19    Date for PT Re-Evaluation 10/01/22    Authorization Type [employer] MCD    Authorization - Visit Number 10    Authorization - Number of Visits 27    Progress Note Due on Visit 20    PT Start Time 1443    PT Stop Time 0927    PT Time Calculation (min) 43 min    Activity Tolerance Patient tolerated treatment well;No increased pain    Behavior During Therapy [facility] for tasks assessed/performed                     Past Medical History:  Diagnosis Date   Infection 10/15/2010   UTERUS ?   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   Encounter for insertion of mirena IUD - inserted 12/12/12 12/12/2012   SVD (spontaneous vaginal delivery) 09/23/2012   Chorioamnionitis 09/23/2012   Late prenatal care 08/27/2012   Normal pregnancy, first 08/11/2012   Anemia 09/17/2011    REFERRING DIAG: REFERRING DIAG: M79.604,M79.605 (ICD-10-CM) - Pain in both lower extremities  THERAPY DIAG:  Pain in right  thigh  Pain in right ankle and joints of right foot  Muscle weakness (generalized)  Difficulty in walking, not elsewhere classified  Low back pain with right-sided sciatica, unspecified back pain laterality, unspecified chronicity  Rationale for Evaluation and Treatment Rehabilitation  PERTINENT HISTORY: Exercise induced asthma, pt also reports she received epidural 9 years ago without effect during delivery of child  PRECAUTIONS: none  SUBJECTIVE:  Pt arrives and states she did a lot of driving last week which has increased her symptoms. States she felt good after last session. Pt states she has noticed good improvement in ankle pain but continues to have back pain with increased sitting.  No other new updates, feels she is starting to see progress from therapy and would like to continue.     PAIN: Are you having pain: yes, 3/10 in ankle, 5/10 in back Location: RLE, lateral thigh down to ankle, low back How would you describe your pain? Aching, tingling Best in past week: 2/10 ankle, 3/10 back Worst in past week: 6/10 ankle/LE and low back Aggravating factors: standing/walking >1 hr, sitting >2 hr, driving, working out Easing factors: ice, heat, icy hot lying down/resting    OBJECTIVE: (objective measures completed at initial evaluation unless otherwise dated)   DIAGNOSTIC FINDINGS:  Lumbar Xray 05/15/2022 FINDINGS: Normal anatomic alignment. No evidence for acute fracture or  dislocation. Preservation of the vertebral body and intervertebral disc space heights. SI joints unremarkable. Intrauterine device is present.   IMPRESSION: Negative.   PATIENT SURVEYS:  LEFS 51/80 08/06/22 LEFS: 56/80   SCREENING FOR RED FLAGS: negative   COGNITION:           Overall cognitive status: Within functional limits for tasks assessed                          SENSATION: LT increased on RLE vs LLE in all dermatomes     POSTURE: No Significant postural limitations    PALPATION: Deferred on eval due to time constraints 07/02/22: TTP R gastroc, anterior tibialis, and distal quad, same on LLE but reduced tenderness   LUMBAR ROM:    Active  A/PROM  eval  Flexion WFL  Extension WFL  Right rotation WFL  Left rotation WFL   (Blank rows = not tested) Comments: No pain with lumbar ROM although pt does report stretching in abdomen w/ lumbar extension   LOWER EXTREMITY MMT:     MMT Right eval Left eval Right/Left 08/06/22  Hip flexion 3+ 3+ 4/4-  Hip abduction 5 5   Hip internal rotation 3+ 3+ 4-/4  Hip external rotation 3+ 3+ 4-/4  Knee flexion 4 4 4/4+  Knee extension 4 4 4/4+  Ankle dorsiflexion 4+, p! 4 5/5  Ankle inversion 4, p! 4 5p!/5  Ankle eversion 4 4    (Blank rows = not tested) Comments: Pain in thigh w B MMT, R more sensitive than L. Most discomfort in anterior/medial ankle with MMT     FUNCTIONAL TESTS:  Slump + on R negative on L  08/06/22: slump + R    GAIT: Distance walked: within clinic Assistive device utilized: None Level of assistance: Complete Independence Comments: Gait mechanics grossly WNL, no evidence of instability or significant impairment although pt does report reduced tolerance to ambulation       TODAY'S TREATMENT  [facility] Adult PT Treatment:                                                DATE: 08/06/22 Therapeutic Exercise: BTB 4 way ankle x12 8# suitcase carry 2x75f B 8# STS from mat 2x5 Swiss ball push down x10 fwd/lateral, standing Bird dog x8 B Cat/camel x10 CArdine Engpose 3x30sec Slant board DF stretch x12  Unbilled time spent with progress note/pt discussion   [facility] Adult PT Treatment:                                                DATE: 07/30/22 Therapeutic Exercise: Heel raises w ball 2x8 BTB 4way x10 5# suitcase carry 2x250f5# STS from lowest mat, 3x5 Swiss ball push down x8 fwd/sidebending, standing Bridge + ball squeeze 2x10 Bent knee slant board DF stretch x10 Child's pose  2x30sec Swiss ball rollout 3 way x8 Supine hamstring stretch w strap x8 BLE   [facility] Adult PT Treatment:  DATE: 07/23/22 Therapeutic Exercise: Heel raises w ball 2x8 BTB 4way x8 3# suitcase carry 2x20f Swiss ball push down 2x10 standing Bridge + ball squeeze 2x8 Bent knee slant board DF stretch x10 Child's pose 2x30sec Swiss ball rollout 3 way Seated hamstring stretch 2x30 sec B LE    PATIENT EDUCATION:  Education details: PT progress/POC, rationale for interventions Person educated: Patient Education method: Explanation, Demonstration, Tactile cues, Verbal cues Education comprehension: verbalized understanding, returned demonstration, verbal cues required, tactile cues required, and needs further education      HOME EXERCISE PROGRAM: Access Code: [email or web address] URL: [email or web address] Date: 07/16/2022 Prepared by: DEnis Slipper Exercises - Seated Ankle Eversion with Resistance  - 2-3 x daily - 7 x weekly - 1 sets - 8 reps - Seated Ankle Plantar Flexion with Resistance Loop  - 2-3 x daily - 7 x weekly - 1 sets - 8 reps - Seated Ankle Inversion with Resistance  - 2-3 x daily - 7 x weekly - 1 sets - 8 reps - Sit to Stand  - 2-3 x daily - 7 x weekly - 1 sets - 8 reps   ASSESSMENT:   CLINICAL IMPRESSION: Pt is a 26y.o woman arriving to PT on this date for follow up w/ chronic RLE pain since gym injury about a year ago. Pt arrives with report of increased pain after driving more last week. Overall pt has had slow but steady progress with PT, reports most progress with R LE/ankle, continues to have moderate low back pain with sitting/standing/walking. Pt demos notable improvements in LE strength on examination and significantly reduced symptom irritability compared to initial evaluation. LEFS with modest improvement as noted above. Pt tolerates progression for increased emphasis on core stability well, denies any increase in  pain. Continues to have mild LE weakness and reduced activity tolerance. Recommend extending PT for additional four weeks and increasing frequency back to 2x/week as pt reports having better symptom control with increased frequency. Pt departs today's session in no acute distress, all voiced questions/concerns addressed appropriately from PT perspective.       OBJECTIVE IMPAIRMENTS decreased activity tolerance, decreased endurance, decreased mobility, difficulty walking, decreased strength, and pain.    ACTIVITY LIMITATIONS carrying, lifting, sitting, standing, squatting, sleeping, and toileting   PARTICIPATION LIMITATIONS: cleaning, laundry, driving, and community activity   PERSONAL FACTORS Past/current experiences and Time since onset of injury/illness/exacerbation are also affecting patient's functional outcome.    REHAB POTENTIAL: Good   CLINICAL DECISION MAKING: Evolving/moderate complexity   EVALUATION COMPLEXITY: Moderate     GOALS: Goals reviewed with patient? Yes   SHORT TERM GOALS: Target date: 07/19/2022   Pt will demonstrate appropriate understanding and performance of initially prescribed HEP in order to facilitate improved independence with management of symptoms.  Baseline: HEP provided on eval 08/06/22: able to perform familiar HEP w/o assist Goal status: MET   2.  Pt will score >60/80 on LEFS in order to indicate improved perception of function due to symptoms.  Baseline: 51/80 08/06/22: 56/80 Goal status: PROGRESSING     LONG TERM GOALS: Target date: 09/07/2022 (extended on 08/06/22)   Pt will score 70 or above on LEFS in order to demonstrate improved perception of functional status due to symptoms.  Baseline: 51/80 08/06/22: 56/80 Goal status: PROGRESSING   2.  Pt will demonstrate B hip ER/IR MMT of 4/5 in order to demonstrate improved strength for functional movements.  Baseline: 3+/5 B  08/06/22: see MMT chart above Goal status: PROGRESSING  3. Pt  will report ability to walk/stand for >66mn with less than 4/10 pain in order to maximize tolerance to community and social activities.  Baseline: 10-15 min 08/06/22: up to 6/10 pain with walk/stand 1 hr Goal status: PROGRESSING   4. Pt will report ability to sit for >1hr with less than 4/10 pain in order to promote tolerance to driving for family activities.             Baseline: 375m  08/06/22: up to 6/10 with sitting up to 2 hrs            Goal status: PROGRESSING     PLAN: PT FREQUENCY: 2x/week    PT DURATION: 4 weeks   PLANNED INTERVENTIONS: Therapeutic exercises, Therapeutic activity, Neuromuscular re-education, Balance training, Gait training, Patient/Family education, Self Care, Joint mobilization, Joint manipulation, Stair training, Aquatic Therapy, Dry Needling, Spinal manipulation, Spinal mobilization, Cryotherapy, Moist heat, Manual therapy, and Re-evaluation.   PLAN FOR NEXT SESSION:  incorporate more core/lumbar work, assess appropriateness of functional lifting/endurance exercises. Pt interested in more pilates/yoga type exercise    DaLeeroy ChaT, DPT 08/06/2022 9:37 AM

## 2022-08-06 ENCOUNTER — Ambulatory Visit: Payer: Medicaid Other | Admitting: Physical Therapy

## 2022-08-06 ENCOUNTER — Encounter: Payer: Self-pay | Admitting: Physical Therapy

## 2022-08-06 DIAGNOSIS — M5441 Lumbago with sciatica, right side: Secondary | ICD-10-CM

## 2022-08-06 DIAGNOSIS — M79651 Pain in right thigh: Secondary | ICD-10-CM | POA: Diagnosis not present

## 2022-08-06 DIAGNOSIS — M6281 Muscle weakness (generalized): Secondary | ICD-10-CM

## 2022-08-06 DIAGNOSIS — F331 Major depressive disorder, recurrent, moderate: Secondary | ICD-10-CM | POA: Diagnosis not present

## 2022-08-06 DIAGNOSIS — M25571 Pain in right ankle and joints of right foot: Secondary | ICD-10-CM

## 2022-08-06 DIAGNOSIS — F3132 Bipolar disorder, current episode depressed, moderate: Secondary | ICD-10-CM | POA: Diagnosis not present

## 2022-08-06 DIAGNOSIS — R262 Difficulty in walking, not elsewhere classified: Secondary | ICD-10-CM

## 2022-08-08 ENCOUNTER — Ambulatory Visit: Payer: Medicaid Other | Admitting: Registered"

## 2022-08-08 DIAGNOSIS — N3021 Other chronic cystitis with hematuria: Secondary | ICD-10-CM | POA: Diagnosis not present

## 2022-08-09 DIAGNOSIS — F331 Major depressive disorder, recurrent, moderate: Secondary | ICD-10-CM | POA: Diagnosis not present

## 2022-08-10 NOTE — Therapy (Signed)
OUTPATIENT PHYSICAL THERAPY TREATMENT NOTE   Patient Name: Kaitlyn Crane MRN: [id number] DOB:[birth year], 26 y.o., female Today's Date: 08/13/2022   PCP: Olive Bass, FNP   REFERRING PROVIDER: Olive Bass, FNP  END OF SESSION:   PT End of Session - 08/13/22 0849     Visit Number 11    Number of Visits 19    Date for PT Re-Evaluation 10/01/22    Authorization Type [employer] MCD    Authorization - Visit Number 11    Authorization - Number of Visits 27    Progress Note Due on Visit 20    PT Start Time 0848   pt late arrival   PT Stop Time 0927    PT Time Calculation (min) 39 min    Activity Tolerance Patient tolerated treatment well;No increased pain;Patient limited by fatigue    Behavior During Therapy [facility] for tasks assessed/performed                      Past Medical History:  Diagnosis Date   Infection 10/15/2010   UTERUS ?   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   Encounter for insertion of mirena IUD - inserted 12/12/12 12/12/2012   SVD (spontaneous vaginal delivery) 09/23/2012   Chorioamnionitis 09/23/2012   Late prenatal care 08/27/2012   Normal pregnancy, first 08/11/2012   Anemia 09/17/2011    REFERRING DIAG: REFERRING DIAG: M79.604,M79.605 (ICD-10-CM) - Pain in both lower extremities  THERAPY DIAG:  Pain in right thigh  Pain in right ankle and joints of right foot  Muscle weakness (generalized)  Difficulty in  walking, not elsewhere classified  Low back pain with right-sided sciatica, unspecified back pain laterality, unspecified chronicity  Rationale for Evaluation and Treatment Rehabilitation  PERTINENT HISTORY: Exercise induced asthma, pt also reports she received epidural 9 years ago without effect during delivery of child  PRECAUTIONS: none  SUBJECTIVE:  Pt arrives without resting pain in foot but reports 2/10 pain with movement, 2/10 resting pain in back. States she is actually feeling "great" after last session. Wants to continue working on low back more.     PAIN: Are you having pain: yes, 2/10 in ankle, 2/10 in back Location: RLE, lateral thigh down to ankle, low back How would you describe your pain? Aching, tingling Best in past week: 0/10 ankle, 0/10 back Worst in past week: 6/10 ankle/LE and low back Aggravating factors: standing/walking >1 hr, sitting >2 hr, driving, working out Easing factors: ice, heat, icy hot lying down/resting    OBJECTIVE: (objective measures completed at initial evaluation unless otherwise dated)   DIAGNOSTIC FINDINGS:  Lumbar Xray 05/15/2022 FINDINGS: Normal anatomic alignment. No evidence for acute fracture or dislocation. Preservation of the vertebral body and intervertebral disc space heights. SI joints unremarkable. Intrauterine device is present.   IMPRESSION: Negative.   PATIENT SURVEYS:  LEFS 51/80 08/06/22 LEFS: 56/80   SCREENING FOR RED FLAGS: negative   COGNITION:  Overall cognitive status: Within functional limits for tasks assessed                          SENSATION: LT increased on RLE vs LLE in all dermatomes     POSTURE: No Significant postural limitations   PALPATION: Deferred on eval due to time constraints 07/02/22: TTP R gastroc, anterior tibialis, and distal quad, same on LLE but reduced tenderness   LUMBAR ROM:    Active  A/PROM  eval  Flexion WFL  Extension WFL  Right rotation WFL  Left  rotation WFL   (Blank rows = not tested) Comments: No pain with lumbar ROM although pt does report stretching in abdomen w/ lumbar extension   LOWER EXTREMITY MMT:     MMT Right eval Left eval Right/Left 08/06/22  Hip flexion 3+ 3+ 4/4-  Hip abduction 5 5   Hip internal rotation 3+ 3+ 4-/4  Hip external rotation 3+ 3+ 4-/4  Knee flexion 4 4 4/4+  Knee extension 4 4 4/4+  Ankle dorsiflexion 4+, p! 4 5/5  Ankle inversion 4, p! 4 5p!/5  Ankle eversion 4 4    (Blank rows = not tested) Comments: Pain in thigh w B MMT, R more sensitive than L. Most discomfort in anterior/medial ankle with MMT     FUNCTIONAL TESTS:  Slump + on R negative on L  08/06/22: slump + R    GAIT: Distance walked: within clinic Assistive device utilized: None Level of assistance: Complete Independence Comments: Gait mechanics grossly WNL, no evidence of instability or significant impairment although pt does report reduced tolerance to ambulation       TODAY'S TREATMENT  [facility] Adult PT Treatment:                                DATE: 08/13/22 Therapeutic Exercise: Black theraband x8 4 way Standing heel raises x12, cues for form STS 2x5 w/ 10# DB, cues for increased ascent velocity and slow descent Suitcase carry 10#, 3x80ft each UE, cues for posture 10# KB deadlift from 12inch step, x10 cues for hinge and trunk posture Bridge + ball squeeze x12 Swiss ball press down x12 fwd and lateral, cues for posture and breath control Bird dog x8 B, cues for reduced compensations at pelvis  Cat/camel 2x12 cues for form, visual demonstrations required, cues to increase lumbopelvic movement (majority of movement initially comes at upper back/shoulders) Childs pose 2x45sec, cues for setup Slant board DF rocking, x12 RLE only at counter   [facility] Adult PT Treatment:                                                DATE: 08/06/22 Therapeutic Exercise: BTB 4 way ankle x12 8# suitcase carry 2x36ft B 8# STS from mat  2x5 Swiss ball push down x10 fwd/lateral, standing Bird dog x8 B Cat/camel x10 Marjo Bicker pose 3x30sec Slant board DF stretch x12  Unbilled time spent with progress note/pt discussion   [facility] Adult PT Treatment:  DATE: 07/30/22 Therapeutic Exercise: Heel raises w ball 2x8 BTB 4way x10 5# suitcase carry 2x50ft 5# STS from lowest mat, 3x5 Swiss ball push down x8 fwd/sidebending, standing Bridge + ball squeeze 2x10 Bent knee slant board DF stretch x10 Child's pose 2x30sec Swiss ball rollout 3 way x8 Supine hamstring stretch w strap x8 BLE     PATIENT EDUCATION:  Education details:rationale for interventions Person educated: Patient Education method: Explanation, Demonstration, Tactile cues, Verbal cues Education comprehension: verbalized understanding, returned demonstration, verbal cues required, tactile cues required, and needs further education      HOME EXERCISE PROGRAM: Access Code: [email or web address] URL: [email or web address] Date: 07/16/2022 Prepared by: Fransisco Hertz  Exercises - Seated Ankle Eversion with Resistance  - 2-3 x daily - 7 x weekly - 1 sets - 8 reps - Seated Ankle Plantar Flexion with Resistance Loop  - 2-3 x daily - 7 x weekly - 1 sets - 8 reps - Seated Ankle Inversion with Resistance  - 2-3 x daily - 7 x weekly - 1 sets - 8 reps - Sit to Stand  - 2-3 x daily - 7 x weekly - 1 sets - 8 reps   ASSESSMENT:   CLINICAL IMPRESSION: Pt is a 26 y.o woman arriving to PT on this date for follow up w/ chronic RLE pain since gym injury about a year ago. Pt arrives with report of improving pain, motivated for exercise. Initiated with progression of resistance for open chain ankle exercises with good tolerance. Progressed repetition/resistance for core exercises, good tolerance with addition of KB deadlifts with cues for appropriate hinge. Pt tolerates session quite well with no increase in pain, primary report of  muscular fatigue. Pt departs today's session in no acute distress, all voiced questions/concerns addressed appropriately from PT perspective.        OBJECTIVE IMPAIRMENTS decreased activity tolerance, decreased endurance, decreased mobility, difficulty walking, decreased strength, and pain.    ACTIVITY LIMITATIONS carrying, lifting, sitting, standing, squatting, sleeping, and toileting   PARTICIPATION LIMITATIONS: cleaning, laundry, driving, and community activity   PERSONAL FACTORS Past/current experiences and Time since onset of injury/illness/exacerbation are also affecting patient's functional outcome.    REHAB POTENTIAL: Good   CLINICAL DECISION MAKING: Evolving/moderate complexity   EVALUATION COMPLEXITY: Moderate     GOALS: Goals reviewed with patient? Yes   SHORT TERM GOALS: Target date: 07/19/2022   Pt will demonstrate appropriate understanding and performance of initially prescribed HEP in order to facilitate improved independence with management of symptoms.  Baseline: HEP provided on eval 08/06/22: able to perform familiar HEP w/o assist Goal status: MET   2.  Pt will score >60/80 on LEFS in order to indicate improved perception of function due to symptoms.  Baseline: 51/80 08/06/22: 56/80 Goal status: PROGRESSING     LONG TERM GOALS: Target date: 09/07/2022 (extended on 08/06/22)   Pt will score 70 or above on LEFS in order to demonstrate improved perception of functional status due to symptoms.  Baseline: 51/80 08/06/22: 56/80 Goal status: PROGRESSING   2.  Pt will demonstrate B hip ER/IR MMT of 4/5 in order to demonstrate improved strength for functional movements.  Baseline: 3+/5 B  08/06/22: see MMT chart above Goal status: PROGRESSING   3. Pt will report ability to walk/stand for >50min with less than 4/10 pain in order to maximize tolerance to community and social activities.  Baseline: 10-15 min 08/06/22: up to 6/10 pain with walk/stand 1 hr Goal  status: PROGRESSING   4. Pt will report  ability to sit for >1hr with less than 4/10 pain in order to promote tolerance to driving for family activities.             Baseline:  08/06/22: up to 6/10 with sitting up to 2 hrs            Goal status: PROGRESSING     PLAN: PT FREQUENCY: 2x/week    PT DURATION: 4 weeks   PLANNED INTERVENTIONS: Therapeutic exercises, Therapeutic activity, Neuromuscular re-education, Balance training, Gait training, Patient/Family education, Self Care, Joint mobilization, Joint manipulation, Stair training, Aquatic Therapy, Dry Needling, Spinal manipulation, Spinal mobilization, Cryotherapy, Moist heat, Manual therapy, and Re-evaluation.   PLAN FOR NEXT SESSION:  continue to progress core/lumbar work as able/appropriate. Pt interested in yoga/pilates, continue to incorporate exercises as able/appropriate    Ashley Murrain PT, DPT 08/13/2022 9:28 AM

## 2022-08-13 ENCOUNTER — Encounter: Payer: Self-pay | Admitting: Physical Therapy

## 2022-08-13 ENCOUNTER — Ambulatory Visit: Payer: Medicaid Other | Admitting: Physical Therapy

## 2022-08-13 DIAGNOSIS — M25571 Pain in right ankle and joints of right foot: Secondary | ICD-10-CM

## 2022-08-13 DIAGNOSIS — M79651 Pain in right thigh: Secondary | ICD-10-CM | POA: Diagnosis not present

## 2022-08-13 DIAGNOSIS — R262 Difficulty in walking, not elsewhere classified: Secondary | ICD-10-CM

## 2022-08-13 DIAGNOSIS — M5441 Lumbago with sciatica, right side: Secondary | ICD-10-CM

## 2022-08-13 DIAGNOSIS — F331 Major depressive disorder, recurrent, moderate: Secondary | ICD-10-CM | POA: Diagnosis not present

## 2022-08-13 DIAGNOSIS — M6281 Muscle weakness (generalized): Secondary | ICD-10-CM

## 2022-08-15 DIAGNOSIS — F331 Major depressive disorder, recurrent, moderate: Secondary | ICD-10-CM | POA: Diagnosis not present

## 2022-08-15 NOTE — Therapy (Deleted)
OUTPATIENT PHYSICAL THERAPY TREATMENT NOTE   Patient Name: Kaitlyn Crane MRN: [id number] DOB:April 10, 1996, 26 y.o., female Today's Date: 08/15/2022   PCP: [name], FNP   REFERRING PROVIDER: [name], FNP  END OF SESSION:              Past Medical History:  Diagnosis Date   Infection 10/15/2010   UTERUS ?   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  Rationale for Evaluation and Treatment Rehabilitation  PERTINENT HISTORY: Exercise induced asthma, pt also reports she received epidural 9 years ago without effect during delivery of child  PRECAUTIONS: none  SUBJECTIVE:  Pt arrives without resting pain in foot but reports 2/10 pain with movement, 2/10 resting pain in back. States she is actually feeling "great" after last session. Wants to continue working on low back more.     PAIN: Are you having pain: yes, 2/10 in ankle, 2/10 in back Location: RLE, lateral thigh down to ankle, low back How would you describe your pain? Aching, tingling Best in past week: 0/10  ankle, 0/10 back Worst in past week: 6/10 ankle/LE and low back Aggravating factors: standing/walking >1 hr, sitting >2 hr, driving, working out Easing factors: ice, heat, icy hot lying down/resting    OBJECTIVE: (objective measures completed at initial evaluation unless otherwise dated)   DIAGNOSTIC FINDINGS:  Lumbar Xray 05/15/2022 FINDINGS: Normal anatomic alignment. No evidence for acute fracture or dislocation. Preservation of the vertebral body and intervertebral disc space heights. SI joints unremarkable. Intrauterine device is present.   IMPRESSION: Negative.   PATIENT SURVEYS:  LEFS 51/80 08/06/22 LEFS: 56/80   SCREENING FOR RED FLAGS: negative   COGNITION:           Overall cognitive status: Within functional limits for tasks assessed                          SENSATION: LT increased on RLE vs LLE in all dermatomes     POSTURE: No Significant postural limitations   PALPATION: Deferred on eval due to time constraints 07/02/22: TTP R gastroc, anterior tibialis, and distal quad, same on LLE but reduced tenderness   LUMBAR ROM:    Active  A/PROM  eval  Flexion WFL  Extension WFL  Right rotation WFL  Left rotation WFL   (Blank rows = not tested) Comments: No pain with lumbar ROM although pt does report stretching in abdomen w/ lumbar extension   LOWER EXTREMITY MMT:     MMT Right eval Left eval Right/Left 08/06/22  Hip flexion 3+ 3+  4/4-  Hip abduction 5 5   Hip internal rotation 3+ 3+ 4-/4  Hip external rotation 3+ 3+ 4-/4  Knee flexion 4 4 4/4+  Knee extension 4 4 4/4+  Ankle dorsiflexion 4+, p! 4 5/5  Ankle inversion 4, p! 4 5p!/5  Ankle eversion 4 4    (Blank rows = not tested) Comments: Pain in thigh w B MMT, R more sensitive than L. Most discomfort in anterior/medial ankle with MMT     FUNCTIONAL TESTS:  Slump + on R negative on L  08/06/22: slump + R    GAIT: Distance walked: within clinic Assistive device utilized: None Level of  assistance: Complete Independence Comments: Gait mechanics grossly WNL, no evidence of instability or significant impairment although pt does report reduced tolerance to ambulation       TODAY'S TREATMENT    [facility] Adult PT Treatment:                                                DATE: 08/16/22 Therapeutic Exercise: *** Manual Therapy: *** Neuromuscular re-ed: *** Therapeutic Activity: *** Modalities: *** Self Care: [facility] Adult PT Treatment:                                DATE: 08/13/22 Therapeutic Exercise: Black theraband x8 4 way Standing heel raises x12, cues for form STS 2x5 w/ 10# DB, cues for increased ascent velocity and slow descent Suitcase carry 10#, 3x87f each UE, cues for posture 10# KB deadlift from 12inch step, x10 cues for hinge and trunk posture Bridge + ball squeeze x12 Swiss ball press down x12 fwd and lateral, cues for posture and breath control Bird dog x8 B, cues for reduced compensations at pelvis  Cat/camel 2x12 cues for form, visual demonstrations required, cues to increase lumbopelvic movement (majority of movement initially comes at upper back/shoulders) Childs pose 2x45sec, cues for setup SEcolabDF rocking, x12 RLE only at counter   [facility]                                                [facility]: 08[facility]22 Therapeutic Exercise: BTB 4 way ankle x12 8# suitcase carry [id number] 8# STS from mat 2x5 Swiss ball push down x10 fwd/lateral, standing Bird dog x8 B Cat/camel x10 ChArdine Engose 3x30sec Slant board DF stretch x12  Unbilled time spent with progress note/pt discussion   [facility] Adult PT Treatment:                                                DATE: 07/30/22 Therapeutic Exercise: Heel raises w ball 2x8 BTB 4way x10 5# suitcase carry 2x2542f# STS from lowest mat, 3x5 Swiss ball push down x8 fwd/sidebending, standing Bridge + ball squeeze 2x10 Bent knee slant board DF stretch x10 Child's pose 2x30sec Swiss ball  rollout 3 way x8 Supine hamstring stretch w strap x8 BLE     PATIENT EDUCATION:  Education details:rationale for interventions Person educated: Patient Education method: ExpConsulting civil engineeremonstration,  Tactile cues, Verbal cues Education comprehension: verbalized understanding, returned demonstration, verbal cues required, tactile cues required, and needs further education      HOME EXERCISE PROGRAM: Access Code: [email or web address] URL: [email or web address] Date: 07/16/2022 Prepared by: Enis Slipper  Exercises - Seated Ankle Eversion with Resistance  - 2-3 x daily - 7 x weekly - 1 sets - 8 reps - Seated Ankle Plantar Flexion with Resistance Loop  - 2-3 x daily - 7 x weekly - 1 sets - 8 reps - Seated Ankle Inversion with Resistance  - 2-3 x daily - 7 x weekly - 1 sets - 8 reps - Sit to Stand  - 2-3 x daily - 7 x weekly - 1 sets - 8 reps   ASSESSMENT:   CLINICAL IMPRESSION: Pt is a 26 y.o woman arriving to PT on this date for follow up w/ chronic RLE pain since gym injury about a year ago. Pt arrives with report of improving pain, motivated for exercise. Initiated with progression of resistance for open chain ankle exercises with good tolerance. Progressed repetition/resistance for core exercises, good tolerance with addition of KB deadlifts with cues for appropriate hinge. Pt tolerates session quite well with no increase in pain, primary report of muscular fatigue. Pt departs today's session in no acute distress, all voiced questions/concerns addressed appropriately from PT perspective.        OBJECTIVE IMPAIRMENTS decreased activity tolerance, decreased endurance, decreased mobility, difficulty walking, decreased strength, and pain.    ACTIVITY LIMITATIONS carrying, lifting, sitting, standing, squatting, sleeping, and toileting   PARTICIPATION LIMITATIONS: cleaning, laundry, driving, and community activity   PERSONAL FACTORS Past/current experiences and Time since onset of  injury/illness/exacerbation are also affecting patient's functional outcome.    REHAB POTENTIAL: Good   CLINICAL DECISION MAKING: Evolving/moderate complexity   EVALUATION COMPLEXITY: Moderate     GOALS: Goals reviewed with patient? Yes   SHORT TERM GOALS: Target date: 07/19/2022   Pt will demonstrate appropriate understanding and performance of initially prescribed HEP in order to facilitate improved independence with management of symptoms.  Baseline: HEP provided on eval 08/06/22: able to perform familiar HEP w/o assist Goal status: MET   2.  Pt will score >60/80 on LEFS in order to indicate improved perception of function due to symptoms.  Baseline: 51/80 08/06/22: 56/80 Goal status: PROGRESSING     LONG TERM GOALS: Target date: 09/07/2022 (extended on 08/06/22)   Pt will score 70 or above on LEFS in order to demonstrate improved perception of functional status due to symptoms.  Baseline: 51/80 08/06/22: 56/80 Goal status: PROGRESSING   2.  Pt will demonstrate B hip ER/IR MMT of 4/5 in order to demonstrate improved strength for functional movements.  Baseline: 3+/5 B  08/06/22: see MMT chart above Goal status: PROGRESSING   3. Pt will report ability to walk/stand for >97mn with less than 4/10 pain in order to maximize tolerance to community and social activities.  Baseline: 10-15 min 08/06/22: up to 6/10 pain with walk/stand 1 hr Goal status: PROGRESSING   4. Pt will report ability to sit for >1hr with less than 4/10 pain in order to promote tolerance to driving for family activities.             Baseline: 379m  08/06/22: up to 6/10 with sitting up to 2 hrs            Goal status: PROGRESSING     PLAN: PT FREQUENCY: 2x/week    PT DURATION: 4 weeks  PLANNED INTERVENTIONS: Therapeutic exercises, Therapeutic activity, Neuromuscular re-education, Balance training, Gait training, Patient/Family education, Self Care, Joint mobilization, Joint manipulation, Stair  training, Aquatic Therapy, Dry Needling, Spinal manipulation, Spinal mobilization, Cryotherapy, Moist heat, Manual therapy, and Re-evaluation.   PLAN FOR NEXT SESSION:  continue to progress core/lumbar work as able/appropriate. Pt interested in yoga/pilates, continue to incorporate exercises as able/appropriate    Leeroy Cha PT, DPT 08/15/2022 9:59 AM

## 2022-08-16 ENCOUNTER — Ambulatory Visit: Payer: Medicaid Other | Admitting: Physical Therapy

## 2022-08-20 ENCOUNTER — Encounter: Payer: Self-pay | Admitting: Physical Therapy

## 2022-08-20 ENCOUNTER — Ambulatory Visit: Payer: Medicaid Other | Attending: Internal Medicine | Admitting: Physical Therapy

## 2022-08-20 DIAGNOSIS — M6281 Muscle weakness (generalized): Secondary | ICD-10-CM | POA: Insufficient documentation

## 2022-08-20 DIAGNOSIS — M25571 Pain in right ankle and joints of right foot: Secondary | ICD-10-CM | POA: Diagnosis present

## 2022-08-20 DIAGNOSIS — M79651 Pain in right thigh: Secondary | ICD-10-CM | POA: Diagnosis not present

## 2022-08-20 DIAGNOSIS — R262 Difficulty in walking, not elsewhere classified: Secondary | ICD-10-CM | POA: Diagnosis present

## 2022-08-20 DIAGNOSIS — F331 Major depressive disorder, recurrent, moderate: Secondary | ICD-10-CM | POA: Diagnosis not present

## 2022-08-20 DIAGNOSIS — M5441 Lumbago with sciatica, right side: Secondary | ICD-10-CM | POA: Insufficient documentation

## 2022-08-20 NOTE — Therapy (Signed)
OUTPATIENT PHYSICAL THERAPY TREATMENT NOTE   Patient Name: Kaitlyn Crane MRN: [id number] DOB:1996-06-01, 26 y.o., female Today's Date: 08/20/2022   PCP: [name], [place]   REFERRING PROVIDER: [name], FNP  END OF SESSION:   PT End of Session - 08/20/22 0846     Visit Number 12    Number of Visits 19    Date for PT Re-Evaluation 10/01/22    Authorization Type [employer] MCD    Authorization - Visit Number 12    Authorization - Number of Visits 27    Progress Note Due on Visit 20    PT Start Time 4098    PT Stop Time 0927    PT Time Calculation (min) 40 min    Activity Tolerance Patient tolerated treatment well;No increased pain;Patient limited by fatigue    Behavior During Therapy [facility] for tasks assessed/performed                       Past Medical History:  Diagnosis Date   Infection 10/15/2010   UTERUS ?   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   Encounter for insertion of mirena IUD - inserted 12/12/12 12/12/2012   SVD (spontaneous vaginal delivery) 09/23/2012   Chorioamnionitis 09/23/2012   Late prenatal care 08/27/2012   Normal pregnancy, first 08/11/2012   Anemia 09/17/2011    REFERRING DIAG: REFERRING DIAG: M79.604,M79.605 (ICD-10-CM) - Pain in both lower extremities  THERAPY DIAG:  Pain in right thigh  Pain in right ankle and joints of right foot  Muscle weakness (generalized)  Difficulty in walking, not  elsewhere classified  Low back pain with right-sided sciatica, unspecified back pain laterality, unspecified chronicity  Rationale for Evaluation and Treatment Rehabilitation  PERTINENT HISTORY: Exercise induced asthma, pt also reports she received epidural 9 years ago without effect during delivery of child  PRECAUTIONS: none  SUBJECTIVE:  Pt arrives with minimal ankle/foot pain, states her back is a little more aggravated today but attributes to setting up a cubicle over the weekend and being more active. States she felt good after last session, no other new updates.     PAIN: Are you having pain: yes, 2/10 in ankle, 5/10 in back Location: RLE, lateral thigh down to ankle, low back How would you describe your pain? Aching, tingling Best in past week: 0/10 ankle, 0/10 back Worst in past week: 6/10 ankle/LE and low back Aggravating factors: standing/walking >1 hr, sitting >2 hr, driving, working out Easing factors: ice, heat, icy hot lying down/resting    OBJECTIVE: (objective measures completed at initial evaluation unless otherwise dated)   DIAGNOSTIC FINDINGS:  Lumbar Xray 05/15/2022 FINDINGS: Normal anatomic alignment. No evidence for acute fracture or dislocation. Preservation of the vertebral body and intervertebral disc space heights. SI joints unremarkable. Intrauterine device is present.   IMPRESSION: Negative.   PATIENT SURVEYS:  LEFS 51/80 08/06/22 LEFS: 56/80   SCREENING FOR RED FLAGS: negative  COGNITION:           Overall cognitive status: Within functional limits for tasks assessed                          SENSATION: LT increased on RLE vs LLE in all dermatomes     POSTURE: No Significant postural limitations   PALPATION: Deferred on eval due to time constraints 07/02/22: TTP R gastroc, anterior tibialis, and distal quad, same on LLE but reduced tenderness   LUMBAR ROM:    Active  A/PROM  eval  Flexion WFL  Extension WFL  Right rotation  WFL  Left rotation WFL   (Blank rows = not tested) Comments: No pain with lumbar ROM although pt does report stretching in abdomen w/ lumbar extension   LOWER EXTREMITY MMT:     MMT Right eval Left eval Right/Left 08/06/22  Hip flexion 3+ 3+ 4/4-  Hip abduction 5 5   Hip internal rotation 3+ 3+ 4-/4  Hip external rotation 3+ 3+ 4-/4  Knee flexion 4 4 4/4+  Knee extension 4 4 4/4+  Ankle dorsiflexion 4+, p! 4 5/5  Ankle inversion 4, p! 4 5p!/5  Ankle eversion 4 4    (Blank rows = not tested) Comments: Pain in thigh w B MMT, R more sensitive than L. Most discomfort in anterior/medial ankle with MMT     FUNCTIONAL TESTS:  Slump + on R negative on L  08/06/22: slump + R    GAIT: Distance walked: within clinic Assistive device utilized: None Level of assistance: Complete Independence Comments: Gait mechanics grossly WNL, no evidence of instability or significant impairment although pt does report reduced tolerance to ambulation       TODAY'S TREATMENT  [facility] Adult PT Treatment:                                   DATE:08/20/22 Therapeutic Exercise: Black theraband x10 4 way Standing heel raises + 2inch step for deficit, x12, cues for pacing  Long lever squats 5# DB, 3x5 with cues for increased ascent velocity and slow descent 10# KB deadlift from 10inch step, 2x8 Runners step up 6inch w 5# DB contralateral UE, 2x8, cues for posture and increased hip flexion in swing limb Bird dog x8 B, cues for reduced compensation at pelvis Ardine Eng pose 2x68mn, cues for breath control Cat/camel x15, cues for form and pacing Cervicothoracic rotation x10 B, cues for form  Swiss ball fwd rollout x10   [facility] Adult PT Treatment:                                DATE: 08/13/22 Therapeutic Exercise: Black theraband x8 4 way Standing heel raises x12, cues for form STS 2x5 w/ 10# DB, cues for increased ascent velocity and slow descent Suitcase carry 10#, [id number] UE, cues for posture 10# KB  deadlift from 12inch step, x10 cues for hinge and trunk posture Bridge + ball squeeze x12 Swiss ball press down x12 fwd and lateral, cues for posture and breath control Bird dog x8 B, cues for reduced compensations at pelvis  Cat/camel 2x12 cues for form, visual demonstrations required, cues to increase lumbopelvic movement (majority of movement initially comes at upper back/shoulders) Childs pose 2x45sec, cues for setup Slant board DF rocking, x12 RLE only at counter   [facility]                                                DATE: 08/06/22 Therapeutic Exercise: BTB 4 way ankle x12 8# suitcase carry 2x88f B 8# STS from mat 2x5 Swiss ball push down x10 fwd/lateral, standing Bird dog x8 B Cat/camel x10 CArdine Engpose 3x30sec Slant board DF stretch x12  Unbilled time spent with progress note/pt discussion     PATIENT EDUCATION:  Education details:rationale for interventions Person educated: Patient Education method: Explanation, Demonstration, Tactile cues, Verbal cues Education comprehension: verbalized understanding, returned demonstration, verbal cues required, tactile cues required, and needs further education      HOME EXERCISE PROGRAM: Access Code: [email or web address] URL: [email or web address] Date: 07/16/2022 Prepared by: DEnis Slipper Exercises - Seated Ankle Eversion with Resistance  - 2-3 x daily - 7 x weekly - 1 sets - 8 reps - Seated Ankle Plantar Flexion with Resistance Loop  - 2-3 x daily - 7 x weekly - 1 sets - 8 reps - Seated Ankle Inversion with Resistance  - 2-3 x daily - 7 x weekly - 1 sets - 8 reps - Sit to Stand  - 2-3 x daily - 7 x weekly - 1 sets - 8 reps   ASSESSMENT:   CLINICAL IMPRESSION: Pt is a 26y.o woman arriving to PT on this date for follow up w/ chronic RLE pain since gym injury about a year ago. Pt arrives with report of minimal ankle/foot pain and increased low back pain after increased activity over weekend, overall  states she is doing well. Able to progress for increased complexity of exercise compared to previous program and increased loading through posterior chain. Pt requires significant cueing initially for appropriate hinge with progressed ROM for KB deadlifts but improves with repetition. Pt denies any increase in pain as session goes on, primary report of muscular fatigue; she reports excellent relief from stretches at end of session. No adverse events, tolerates session well. Pt departs today's session in no acute distress, all voiced questions/concerns addressed appropriately from PT perspective.       OBJECTIVE IMPAIRMENTS decreased activity tolerance, decreased endurance, decreased mobility, difficulty walking, decreased strength, and pain.    ACTIVITY LIMITATIONS carrying, lifting, sitting, standing, squatting, sleeping, and toileting   PARTICIPATION LIMITATIONS: cleaning, laundry, driving, and community activity   PERSONAL FACTORS Past/current experiences and Time since onset of injury/illness/exacerbation are also affecting patient's functional outcome.    REHAB POTENTIAL: Good   CLINICAL DECISION MAKING: Evolving/moderate complexity   EVALUATION COMPLEXITY: Moderate     GOALS: Goals reviewed with patient? Yes   SHORT TERM GOALS: Target date: 07/19/2022   Pt will demonstrate appropriate understanding and performance of initially prescribed HEP in order to facilitate improved independence with management of symptoms.  Baseline: HEP provided on eval 08/06/22: able to perform familiar HEP w/o assist Goal status: MET   2.  Pt will score >60/80 on LEFS in order to indicate improved perception of function due to symptoms.  Baseline: 51/80 08/06/22: 56/80 Goal status: PROGRESSING     LONG TERM GOALS: Target date: 09/07/2022 (extended on 08/06/22)   Pt will score 70 or above on LEFS in order to demonstrate improved perception of functional status due to symptoms.  Baseline:  51/80 08/06/22: 56/80 Goal status: PROGRESSING   2.  Pt will demonstrate  B hip ER/IR MMT of 4/5 in order to demonstrate improved strength for functional movements.  Baseline: 3+/5 B  08/06/22: see MMT chart above Goal status: PROGRESSING   3. Pt will report ability to walk/stand for >41mn with less than 4/10 pain in order to maximize tolerance to community and social activities.  Baseline: 10-15 min 08/06/22: up to 6/10 pain with walk/stand 1 hr Goal status: PROGRESSING   4. Pt will report ability to sit for >1hr with less than 4/10 pain in order to promote tolerance to driving for family activities.             Baseline: 364m  08/06/22: up to 6/10 with sitting up to 2 hrs            Goal status: PROGRESSING     PLAN: PT FREQUENCY: 2x/week    PT DURATION: 4 weeks   PLANNED INTERVENTIONS: Therapeutic exercises, Therapeutic activity, Neuromuscular re-education, Balance training, Gait training, Patient/Family education, Self Care, Joint mobilization, Joint manipulation, Stair training, Aquatic Therapy, Dry Needling, Spinal manipulation, Spinal mobilization, Cryotherapy, Moist heat, Manual therapy, and Re-evaluation.   PLAN FOR NEXT SESSION:  progress posterior chain and core work as able/appropriate. Increase independence with stretching routine - pt interested in incorporating more yoga/pilates type work    DaLeeroy ChaT, DPT 08/20/2022 9:28 AM

## 2022-08-21 ENCOUNTER — Encounter: Payer: Medicaid Other | Attending: Family | Admitting: Registered"

## 2022-08-21 ENCOUNTER — Encounter: Payer: Self-pay | Admitting: Registered"

## 2022-08-21 DIAGNOSIS — R636 Underweight: Secondary | ICD-10-CM | POA: Diagnosis not present

## 2022-08-21 DIAGNOSIS — R42 Dizziness and giddiness: Secondary | ICD-10-CM | POA: Diagnosis not present

## 2022-08-21 DIAGNOSIS — F509 Eating disorder, unspecified: Secondary | ICD-10-CM | POA: Insufficient documentation

## 2022-08-21 NOTE — Patient Instructions (Addendum)
-   On therapy days, lunch can be eaten at 12 pm instead of 1 pm.   - Aim to have Ensure Plus instead of Ensure High Protein.   - Keep up the great work!

## 2022-08-21 NOTE — Progress Notes (Signed)
Appointment start time: 8:53  Appointment end time: 9:40  Patient was seen on 08/21/2022 for nutrition counseling pertaining to disordered eating  Primary care provider: Jodi Mourning, FNP Therapist: Cheryll Cockayne (sees 2x/week; [place]; has appt 9/14 with therapist, and Pauline Good, MD-psychiatrist on 9/18)   ROI: 08/21/2022 Any other medical team members: none   Assessment  Pt arrives stating she is doing better with eating and feels like she has gained a lot of weight. States she is not driving as much for [employer] to help with eating schedule. Reports she is using social media sources to help with organization of meals. Reports medication changes; not taking Latuda anymore and had to switch lurasidone. State she has been very focused on handling one task at a time. States she has PT until the end of 2023; will try yoga and pilates tomorrow while in therapy.   States she is now is able to finish her food. Reports feeling hunger in the mornings.   States she is getting a little light headed when standing for 1-2 hours while shopping; happened once since previous appt.   States her recent labs are good.    Growth Metrics: Goal rate of weight gain:  0.5-1.0 lb/week  Eating history: Length of time: 1 years Previous treatments: none Goals for RD meetings: improve hair shedding, dizziness/lightheadedness, headaches, focus/concentration, cold intolerance   Weight history:  Today's weight: 115.6 Highest weight: 115; while pregnant 129  Lowest weight: 90 Most consistent weight: 104-106 What would you like to weigh: no How has weight changed in the past year: weight loss  Medical Information:  Changes in hair, skin, nails since ED started: continued hair shedding, yellowish skin, brittle nails, easily bruised, easily bleeds Chewing/swallowing difficulties: yes, tonsils will give trouble with swallowing Reflux or heartburn: no Trouble with teeth: has 6 cavities, has trouble with  grinding teeth since childhood LMP without the use of hormones: 10/13  Weight at that point:  Effect of exercise on menses: none reported   Effect of hormones on menses: N/A; just had Mirena removed on 8/9, has had it for 9 years Constipation, diarrhea: has BM 3-4x/week Dizziness/lightheadedness: sometimes; has improved  Headaches/body aches: no, has improved; body aches have improved as well due to physical therapist Heart racing/chest pain: no, has improved Mood: moody; has more energy during the day Sleep: 6 hrs/night; feels more rested, has improved Focus/concentration: no, has improved  Cold intolerance: no, has improved Vision changes: sometimes, blurred vision  Mental health diagnosis: depression, psychotic disorder, bipolar   Dietary assessment: A typical day consists of 3 meals and 3-4 snacks  Safe foods include: Ramen noodles, stir fry/saute meat & vegetables, white rice, Pha, noodles, hot soupy, burgers, fish, pork, chicken, watermelon  Avoided foods include: blue crab, condiments, fish filets, beef (limits to a few times a year), bread   24 hour recall: shrimp B (8 am): banana S (10 am): sausage, egg, cheese sandwich + Ensure (high protein) S: (11:45 am): yogurt  L (12:30 pm): [employer]-spicy chicken deluxe sandwich + fruit cup + strawberry lemonade S : trail mix + Ensure (high protein) D (7 pm): 1/3 fish + lemon grass + tomatoes + 1 c watermelon + water S (10 pm): banana + Ensure (high protein)  Beverages: water (2*16 oz; 32 oz), Ensure (3*8 oz; 24 oz), strawberry lemonade ((21   Physical activity: none reported  What Methods Do You Use To Control Your Weight (Compensatory behaviors)?  Restricting   Estimated energy intake: 2200-2300 kcal  Estimated energy needs: 2000-2200 kcal 250-275 g CHO 150-165 g pro 44-49 g fat  Nutrition Diagnosis: NB-1.5 Disordered eating pattern As related to skipping meals.  As evidenced by pt reports intentionally  skipping meals at times when depressed.  Intervention/Goals: Pt was encouraged with changes made from previous visit. Discussed correlation between increased nourishment and recent improvement in signs/symptoms. Pt agreed with goals listed. Goals: - On therapy days, lunch can be eaten at 12 pm instead of 1 pm.  - Aim to have Ensure Plus instead of Ensure High Protein.  - Keep up the great work!  Meal plan:    3 meals    2 snacks   Monitoring and Evaluation: Patient will follow up in 4 weeks.

## 2022-08-21 NOTE — Therapy (Addendum)
OUTPATIENT PHYSICAL THERAPY TREATMENT NOTE DISCHARGE   Patient Name: Kaitlyn Crane MRN: [id number] DOB:01-Jun-1996, 26 y.o., female Today's Date: 08/22/2022   PCP: [name], FNP   REFERRING PROVIDER: [name], FNP  END OF SESSION:   PT End of Session - 08/22/22 1555     Visit Number 13    Number of Visits 19    Date for PT Re-Evaluation 10/01/22    Authorization Type [employer] MCD    Authorization - Visit Number 13    Authorization - Number of Visits 27    Progress Note Due on Visit 20    PT Start Time 1550    PT Stop Time 1630    PT Time Calculation (min) 40 min    Activity Tolerance Patient tolerated treatment well;No increased pain;Patient limited by fatigue    Behavior During Therapy [facility] for tasks assessed/performed                        Past Medical History:  Diagnosis Date   Infection 10/15/2010   UTERUS ?   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   Encounter for insertion of mirena IUD - inserted 12/12/12 12/12/2012   SVD (spontaneous vaginal delivery) 09/23/2012   Chorioamnionitis 09/23/2012   Late prenatal care 08/27/2012   Normal pregnancy, first 08/11/2012   Anemia 09/17/2011    REFERRING DIAG: REFERRING DIAG: M79.604,M79.605 (ICD-10-CM) - Pain in both lower extremities  THERAPY DIAG:  Pain in right thigh  Pain in right ankle and joints of right foot  Muscle weakness (generalized)  Difficulty in  walking, not elsewhere classified  Low back pain with right-sided sciatica, unspecified back pain laterality, unspecified chronicity  Rationale for Evaluation and Treatment Rehabilitation  PERTINENT HISTORY: Exercise induced asthma, pt also reports she received epidural 9 years ago without effect during delivery of child  PRECAUTIONS: none  SUBJECTIVE:  Pt arrives with minimal ankle/foot pain, states her back is a little more aggravated today but attributes to setting up a cubicle over the weekend and being more active. States she felt good after last session, no other new updates.     PAIN: Are you having pain: yes, 2/10 in ankle, 5/10 in back Location: RLE, lateral thigh down to ankle, low back How would you describe your pain? Aching, tingling Best in past week: 0/10 ankle, 0/10 back Worst in past week: 6/10 ankle/LE and low back Aggravating factors: standing/walking >1 hr, sitting >2 hr, driving, working out Easing factors: ice, heat, icy hot lying down/resting    OBJECTIVE: (objective measures completed at initial evaluation unless otherwise dated)   DIAGNOSTIC FINDINGS:  Lumbar Xray 05/15/2022 FINDINGS: Normal anatomic alignment. No evidence for acute fracture or dislocation. Preservation of the vertebral body and intervertebral disc space heights. SI joints unremarkable. Intrauterine device is present.   IMPRESSION: Negative.   PATIENT SURVEYS:  LEFS 51/80 08/06/22 LEFS: 56/80   SCREENING FOR RED FLAGS:  negative   COGNITION:           Overall cognitive status: Within functional limits for tasks assessed                          SENSATION: LT increased on RLE vs LLE in all dermatomes     POSTURE: No Significant postural limitations   PALPATION: Deferred on eval due to time constraints 07/02/22: TTP R gastroc, anterior tibialis, and distal quad, same on LLE but reduced tenderness   LUMBAR ROM:    Active  A/PROM  eval  Flexion WFL  Extension WFL   Right rotation WFL  Left rotation WFL   (Blank rows = not tested) Comments: No pain with lumbar ROM although pt does report stretching in abdomen w/ lumbar extension   LOWER EXTREMITY MMT:     MMT Right eval Left eval Right/Left 08/06/22  Hip flexion 3+ 3+ 4/4-  Hip abduction 5 5   Hip internal rotation 3+ 3+ 4-/4  Hip external rotation 3+ 3+ 4-/4  Knee flexion 4 4 4/4+  Knee extension 4 4 4/4+  Ankle dorsiflexion 4+, p! 4 5/5  Ankle inversion 4, p! 4 5p!/5  Ankle eversion 4 4    (Blank rows = not tested) Comments: Pain in thigh w B MMT, R more sensitive than L. Most discomfort in anterior/medial ankle with MMT     FUNCTIONAL TESTS:  Slump + on R negative on L  08/06/22: slump + R    GAIT: Distance walked: within clinic Assistive device utilized: None Level of assistance: Complete Independence Comments: Gait mechanics grossly WNL, no evidence of instability or significant impairment although pt does report reduced tolerance to ambulation       TODAY'S TREATMENT   [facility] Adult PT Treatment:                                                DATE: 08/22/22 Therapeutic Exercise: Pilates Reformer used for LE/core strength, postural strength, lumbopelvic disassociation and core control.  Exercises included: Neutral spine, Transverse Abd , breathing , setup  Footwork 2 red 1 blue  Double leg press out on heels, parallel and turnout and then on toes , to heel raise  Single leg press 2 red 1 blue .  Rt LE easier than Lt.  Bridging all springs x 10 with ball , articulating  90/90 core: alt. Knee extension  Dead bug x 10    Feet in Straps 1 red 1 yellow Arcs in parallel and then in turnout Frog squats x 10 cues to connect inner thighs   Self Care: PIlates, core, neutral spine, breathing Importance of core in HEP POC   [facility] Adult PT Treatment:                                   DATE:08/20/22 Therapeutic Exercise: Black theraband x10 4 way Standing heel raises + 2inch step  for deficit, x12, cues for pacing  Long lever squats 5# DB, 3x5 with cues for increased ascent velocity and slow descent 10# KB deadlift from 10inch step, 2x8 Runners step up 6inch w 5# DB contralateral UE, 2x8, cues for posture and increased hip flexion in swing limb Bird dog x8 B, cues for reduced compensation  at pelvis Ardine Eng pose 2x11min, cues for breath control Cat/camel x15, cues for form and pacing Cervicothoracic rotation x10 B, cues for form  Swiss ball fwd rollout x10   [facility] Adult PT Treatment:                                DATE: 08/13/22 Therapeutic Exercise: Black theraband x8 4 way Standing heel raises x12, cues for form STS 2x5 w/ 10# DB, cues for increased ascent velocity and slow descent Suitcase carry 10#, 3x66ft each UE, cues for posture 10# KB deadlift from 12inch step, x10 cues for hinge and trunk posture Bridge + ball squeeze x12 Swiss ball press down x12 fwd and lateral, cues for posture and breath control Bird dog x8 B, cues for reduced compensations at pelvis  Cat/camel 2x12 cues for form, visual demonstrations required, cues to increase lumbopelvic movement (majority of movement initially comes at upper back/shoulders) Childs pose 2x45sec, cues for setup Slant board DF rocking, x12 RLE only at counter   [facility] Adult PT Treatment:                                                DATE: 08/06/22 Therapeutic Exercise: BTB 4 way ankle x12 8# suitcase carry 2x47ft B 8# STS from mat 2x5 Swiss ball push down x10 fwd/lateral, standing Bird dog x8 B Cat/camel x10 Ardine Eng pose 3x30sec Slant board DF stretch x12  Unbilled time spent with progress note/pt discussion     PATIENT EDUCATION:  Education details:rationale for interventions Person educated: Patient Education method: Explanation, Demonstration, Tactile cues, Verbal cues Education comprehension: verbalized understanding, returned demonstration, verbal cues required, tactile cues required, and needs further  education      HOME EXERCISE PROGRAM: Access Code: [email or web address] URL: [email or web address] Date: 07/16/2022 Prepared by: Enis Slipper  Access Code: [email or web address] URL: [email or web address] Date: 08/22/2022 Prepared by: Raeford Razor  Exercises - Seated Ankle Eversion with Resistance  - 2-3 x daily - 7 x weekly - 1 sets - 8 reps - Seated Ankle Plantar Flexion with Resistance Loop  - 2-3 x daily - 7 x weekly - 1 sets - 8 reps - Seated Ankle Inversion with Resistance  - 2-3 x daily - 7 x weekly - 1 sets - 8 reps - Sit to Stand  - 2-3 x daily - 7 x weekly - 1 sets - 8 reps - Seated Transversus Abdominis Bracing  - 1 x daily - 7 x weekly - 2 sets - 10 reps - 5 hold - Supine Bridge  - 1 x daily - 7 x weekly - 2 sets - 10 reps - 5 hold - Supine Dead Bug with Leg Extension  - 1 x daily - 7 x weekly - 2 sets - 10 reps - 5 hold ASSESSMENT:   CLINICAL IMPRESSION: Patient arrived eager to begin Pilates introductory session.  She reports no pain but a moderate amount of soreness in her low back and bilateral thighs from the last visit.  Brief introduction to Pilates principles was offered.  Patient has no previous experience and has never been exposed to Pilates equipment.  She needed cues to find neutral spine and reported she had not been doing much exercise since beginning therapy.  She does report fear surrounding exercise given her history.  Provided reassurance as  she performed level 1 basic exercises on the reformer.  She needed cueing to maintain neutral spine when hips go into flexion as well as avoiding knees collapsing inward with foot work.  Overall no increased pain just some leg fatigue.  She would like to change one of her appointments each week to Pilates depending on how she feels after today.  For some basic core exercises to do and reinforced the need to perform HEP at home for long-term success.   OBJECTIVE IMPAIRMENTS decreased activity tolerance, decreased  endurance, decreased mobility, difficulty walking, decreased strength, and pain.    ACTIVITY LIMITATIONS carrying, lifting, sitting, standing, squatting, sleeping, and toileting   PARTICIPATION LIMITATIONS: cleaning, laundry, driving, and community activity   PERSONAL FACTORS Past/current experiences and Time since onset of injury/illness/exacerbation are also affecting patient's functional outcome.    REHAB POTENTIAL: Good   CLINICAL DECISION MAKING: Evolving/moderate complexity   EVALUATION COMPLEXITY: Moderate     GOALS: Goals reviewed with patient? Yes   SHORT TERM GOALS: Target date: 07/19/2022   Pt will demonstrate appropriate understanding and performance of initially prescribed HEP in order to facilitate improved independence with management of symptoms.  Baseline: HEP provided on eval 08/06/22: able to perform familiar HEP w/o assist Goal status: MET   2.  Pt will score >60/80 on LEFS in order to indicate improved perception of function due to symptoms.  Baseline: 51/80 08/06/22: 56/80 Goal status: PROGRESSING     LONG TERM GOALS: Target date: 09/07/2022 (extended on 08/06/22)   Pt will score 70 or above on LEFS in order to demonstrate improved perception of functional status due to symptoms.  Baseline: 51/80 08/06/22: 56/80 Goal status: PROGRESSING   2.  Pt will demonstrate B hip ER/IR MMT of 4/5 in order to demonstrate improved strength for functional movements.  Baseline: 3+/5 B  08/06/22: see MMT chart above Goal status: PROGRESSING   3. Pt will report ability to walk/stand for >74min with less than 4/10 pain in order to maximize tolerance to community and social activities.  Baseline: 10-15 min 08/06/22: up to 6/10 pain with walk/stand 1 hr Goal status: PROGRESSING   4. Pt will report ability to sit for >1hr with less than 4/10 pain in order to promote tolerance to driving for family activities.             Baseline: 72min  08/06/22: up to 6/10 with  sitting up to 2 hrs            Goal status: PROGRESSING     PLAN: PT FREQUENCY: 2x/week    PT DURATION: 4 weeks   PLANNED INTERVENTIONS: Therapeutic exercises, Therapeutic activity, Neuromuscular re-education, Balance training, Gait training, Patient/Family education, Self Care, Joint mobilization, Joint manipulation, Stair training, Aquatic Therapy, Dry Needling, Spinal manipulation, Spinal mobilization, Cryotherapy, Moist heat, Manual therapy, and Re-evaluation.   PLAN FOR NEXT SESSION: pilates. Core strength/stability.     Raeford Razor, PT 08/22/22 7:48 PM Phone: [phone] Fax: [phone]    PHYSICAL THERAPY DISCHARGE SUMMARY  Visits from Start of Care: 13  Current functional level related to goals / functional outcomes: unknown   Remaining deficits: See above    Education / Equipment: HEP    Patient agrees to discharge. Patient goals were partially met. Patient is being discharged due to not returning since the last visit.  Raeford Razor, PT 12/21/22 11:58 AM Phone: [phone] Fax: [phone]

## 2022-08-22 ENCOUNTER — Encounter: Payer: Self-pay | Admitting: Physical Therapy

## 2022-08-22 ENCOUNTER — Ambulatory Visit: Payer: Medicaid Other | Admitting: Physical Therapy

## 2022-08-22 DIAGNOSIS — M79651 Pain in right thigh: Secondary | ICD-10-CM

## 2022-08-22 DIAGNOSIS — M25571 Pain in right ankle and joints of right foot: Secondary | ICD-10-CM

## 2022-08-22 DIAGNOSIS — M5441 Lumbago with sciatica, right side: Secondary | ICD-10-CM

## 2022-08-22 DIAGNOSIS — R262 Difficulty in walking, not elsewhere classified: Secondary | ICD-10-CM

## 2022-08-22 DIAGNOSIS — M6281 Muscle weakness (generalized): Secondary | ICD-10-CM

## 2022-08-23 ENCOUNTER — Ambulatory Visit: Payer: Medicaid Other | Admitting: Physical Therapy

## 2022-08-23 DIAGNOSIS — F331 Major depressive disorder, recurrent, moderate: Secondary | ICD-10-CM | POA: Diagnosis not present

## 2022-08-24 NOTE — Therapy (Incomplete)
OUTPATIENT PHYSICAL THERAPY TREATMENT NOTE   Patient Name: Kaitlyn Crane MRN: [id number] DOB:Apr 06, 1996, 26 y.o., female Today's Date: 08/24/2022   PCP: [name], FNP   REFERRING PROVIDER: [name], FNP  END OF SESSION:                Past Medical History:  Diagnosis Date   Infection 10/15/2010   UTERUS ?   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  Rationale for Evaluation and Treatment Rehabilitation  PERTINENT HISTORY: Exercise induced asthma, pt also reports she received epidural 9 years ago without effect during delivery of child  PRECAUTIONS: none  SUBJECTIVE:  ***  *** Pt arrives with minimal ankle/foot pain, states her back is a little more aggravated today but attributes to setting up a cubicle over the weekend and being more active. States she felt good after last session, no other new updates.     PAIN: Are you having pain: yes, 2/10 in ankle, 5/10 in back Location: RLE, lateral thigh down to ankle, low back How would you describe your pain?  Aching, tingling Best in past week: 0/10 ankle, 0/10 back Worst in past week: 6/10 ankle/LE and low back Aggravating factors: standing/walking >1 hr, sitting >2 hr, driving, working out Easing factors: ice, heat, icy hot lying down/resting    OBJECTIVE: (objective measures completed at initial evaluation unless otherwise dated)   DIAGNOSTIC FINDINGS:  Lumbar Xray 05/15/2022 FINDINGS: Normal anatomic alignment. No evidence for acute fracture or dislocation. Preservation of the vertebral body and intervertebral disc space heights. SI joints unremarkable. Intrauterine device is present.   IMPRESSION: Negative.   PATIENT SURVEYS:  LEFS 51/80 08/06/22 LEFS: 56/80   SCREENING FOR RED FLAGS: negative   COGNITION:           Overall cognitive status: Within functional limits for tasks assessed                          SENSATION: LT increased on RLE vs LLE in all dermatomes     POSTURE: No Significant postural limitations   PALPATION: Deferred on eval due to time constraints 07/02/22: TTP R gastroc, anterior tibialis, and distal quad, same on LLE but reduced tenderness   LUMBAR ROM:    Active  A/PROM  eval  Flexion WFL  Extension WFL  Right rotation WFL  Left rotation WFL   (Blank rows = not tested) Comments: No pain with lumbar ROM although pt does report stretching in abdomen w/ lumbar extension   LOWER EXTREMITY MMT:     MMT Right  eval Left eval Right/Left 08/06/22  Hip flexion 3+ 3+ 4/4-  Hip abduction 5 5   Hip internal rotation 3+ 3+ 4-/4  Hip external rotation 3+ 3+ 4-/4  Knee flexion 4 4 4/4+  Knee extension 4 4 4/4+  Ankle dorsiflexion 4+, p! 4 5/5  Ankle inversion 4, p! 4 5p!/5  Ankle eversion 4 4    (Blank rows = not tested) Comments: Pain in thigh w B MMT, R more sensitive than L. Most discomfort in anterior/medial ankle with MMT     FUNCTIONAL TESTS:  Slump + on R negative on L  08/06/22: slump + R    GAIT: Distance walked: within  clinic Assistive device utilized: None Level of assistance: Complete Independence Comments: Gait mechanics grossly WNL, no evidence of instability or significant impairment although pt does report reduced tolerance to ambulation       TODAY'S TREATMENT  [facility] Adult PT Treatment:                                                DATE: 08/27/22 Therapeutic Exercise: *** Manual Therapy: *** Neuromuscular re-ed: *** Therapeutic Activity: *** Modalities: *** Self Care: [facility] Adult PT Treatment:                                                DATE: 08/22/22 Therapeutic Exercise: Pilates Reformer used for LE/core strength, postural strength, lumbopelvic disassociation and core control.  Exercises included: Neutral spine, Transverse Abd , breathing , setup  Footwork 2 red 1 blue  Double leg press out on heels, parallel and turnout and then on toes , to heel raise  Single leg press 2 red 1 blue .  Rt LE easier than Lt.  Bridging all springs x 10 with ball , articulating  90/90 core: alt. Knee extension  Dead bug x 10    Feet in Straps 1 red 1 yellow Arcs in parallel and then in turnout Frog squats x 10 cues to connect inner thighs   Self Care: PIlates, core, neutral spine, breathing Importance of core in HEP POC   [facility] Adult PT Treatment:                                   DATE:08/20/22 Therapeutic Exercise: Black theraband x10 4 way Standing heel raises + 2inch step for deficit, x12, cues for pacing  Long lever squats 5# DB, 3x5 with cues for increased ascent velocity and slow descent 10# KB deadlift from 10inch step, 2x8 Runners step up 6inch w 5# DB contralateral UE, 2x8, cues for posture and increased hip flexion in swing limb Bird dog x8 B, cues for reduced compensation at pelvis Ardine Eng pose 2x72mn, cues for breath control Cat/camel x15, cues for form and pacing Cervicothoracic rotation x10 B, cues for form  Swiss ball fwd rollout x10        PATIENT EDUCATION:   Education details:rationale for interventions Person educated: Patient Education method: Explanation, Demonstration, Tactile cues, Verbal cues Education comprehension: verbalized understanding, returned demonstration, verbal cues required, tactile cues required, and needs further education      HOME EXERCISE PROGRAM: Access Code: [email or web address] URL:  [email or web address] Date: 07/16/2022 Prepared by: Enis Slipper  Access Code: [email or web address] URL: [email or web address] Date: 08/22/2022 Prepared by: Raeford Razor  Exercises - Seated Ankle Eversion with Resistance  - 2-3 x daily - 7 x weekly - 1 sets - 8 reps - Seated Ankle Plantar Flexion with Resistance Loop  - 2-3 x daily - 7 x weekly - 1 sets - 8 reps - Seated Ankle Inversion with Resistance  - 2-3 x daily - 7 x weekly - 1 sets - 8 reps - Sit to Stand  - 2-3 x daily - 7 x weekly - 1 sets - 8 reps - Seated Transversus Abdominis Bracing  - 1 x daily - 7 x weekly - 2 sets - 10 reps - 5 hold - Supine Bridge  - 1 x daily - 7 x weekly - 2 sets - 10 reps - 5 hold - Supine Dead Bug with Leg Extension  - 1 x daily - 7 x weekly - 2 sets - 10 reps - 5 hold ASSESSMENT:   CLINICAL IMPRESSION: ***  *** Patient arrived eager to begin Pilates introductory session.  She reports no pain but a moderate amount of soreness in her low back and bilateral thighs from the last visit.  Brief introduction to Pilates principles was offered.  Patient has no previous experience and has never been exposed to Pilates equipment.  She needed cues to find neutral spine and reported she had not been doing much exercise since beginning therapy.  She does report fear surrounding exercise given her history.  Provided reassurance as she performed level 1 basic exercises on the reformer.  She needed cueing to maintain neutral spine when hips go into flexion as well as avoiding knees collapsing inward with foot work.  Overall no increased pain just some leg  fatigue.  She would like to change one of her appointments each week to Pilates depending on how she feels after today.  For some basic core exercises to do and reinforced the need to perform HEP at home for long-term success.   OBJECTIVE IMPAIRMENTS decreased activity tolerance, decreased endurance, decreased mobility, difficulty walking, decreased strength, and pain.    ACTIVITY LIMITATIONS carrying, lifting, sitting, standing, squatting, sleeping, and toileting   PARTICIPATION LIMITATIONS: cleaning, laundry, driving, and community activity   PERSONAL FACTORS Past/current experiences and Time since onset of injury/illness/exacerbation are also affecting patient's functional outcome.    REHAB POTENTIAL: Good   CLINICAL DECISION MAKING: Evolving/moderate complexity   EVALUATION COMPLEXITY: Moderate     GOALS: Goals reviewed with patient? Yes   SHORT TERM GOALS: Target date: 07/19/2022   Pt will demonstrate appropriate understanding and performance of initially prescribed HEP in order to facilitate improved independence with management of symptoms.  Baseline: HEP provided on eval 08/06/22: able to perform familiar HEP w/o assist Goal status: MET   2.  Pt will score >60/80 on LEFS in order to indicate improved perception of function due to symptoms.  Baseline: 51/80 08/06/22: 56/80 Goal status: PROGRESSING     LONG TERM GOALS: Target date: 09/07/2022 (extended on 08/06/22)   Pt will score 70 or above on LEFS in order to demonstrate improved perception of functional status due to symptoms.  Baseline: 51/80 08/06/22: 56/80 Goal status: PROGRESSING   2.  Pt will demonstrate B hip ER/IR MMT of 4/5 in order to demonstrate improved strength for functional movements.  Baseline: 3+/5 B  08/06/22: see MMT chart above Goal status: PROGRESSING   3. Pt will report ability to  walk/stand for >24mn with less than 4/10 pain in order to maximize tolerance to community and social activities.   Baseline: 10-15 min 08/06/22: up to 6/10 pain with walk/stand 1 hr Goal status: PROGRESSING   4. Pt will report ability to sit for >1hr with less than 4/10 pain in order to promote tolerance to driving for family activities.             Baseline: 315m  08/06/22: up to 6/10 with sitting up to 2 hrs            Goal status: PROGRESSING     PLAN: PT FREQUENCY: 2x/week    PT DURATION: 4 weeks   PLANNED INTERVENTIONS: Therapeutic exercises, Therapeutic activity, Neuromuscular re-education, Balance training, Gait training, Patient/Family education, Self Care, Joint mobilization, Joint manipulation, Stair training, Aquatic Therapy, Dry Needling, Spinal manipulation, Spinal mobilization, Cryotherapy, Moist heat, Manual therapy, and Re-evaluation.   PLAN FOR NEXT SESSION:  *** pilates. Core strength/stability.     DaLeeroy ChaT, DPT 08/24/2022 12:12 PM

## 2022-08-27 ENCOUNTER — Ambulatory Visit: Payer: Medicaid Other | Admitting: Physical Therapy

## 2022-08-27 DIAGNOSIS — F331 Major depressive disorder, recurrent, moderate: Secondary | ICD-10-CM | POA: Diagnosis not present

## 2022-08-29 ENCOUNTER — Encounter: Payer: Self-pay | Admitting: Family Medicine

## 2022-08-29 ENCOUNTER — Ambulatory Visit (INDEPENDENT_AMBULATORY_CARE_PROVIDER_SITE_OTHER): Payer: Medicaid Other | Admitting: Family Medicine

## 2022-08-29 VITALS — BP 108/76 | HR 88 | Temp 97.9°F | Ht 60.0 in | Wt 119.0 lb

## 2022-08-29 DIAGNOSIS — U071 COVID-19: Secondary | ICD-10-CM

## 2022-08-29 DIAGNOSIS — F331 Major depressive disorder, recurrent, moderate: Secondary | ICD-10-CM | POA: Diagnosis not present

## 2022-08-29 MED ORDER — EPIPEN 2-PAK 0.3 MG/0.3ML IJ SOAJ: 0.3000 mg | INTRAMUSCULAR | 1 refills | Status: DC | PRN

## 2022-08-29 MED ORDER — PREDNISONE 20 MG PO TABS: 40.0000 mg | ORAL_TABLET | Freq: Every day | ORAL | 0 refills | Status: AC

## 2022-08-29 NOTE — Progress Notes (Signed)
Acute Office Visit  Subjective:     Patient ID: Kaitlyn Crane, female    DOB: Sep 06, 1996, 26 y.o.   MRN: [id number]  Chief Complaint  Patient presents with   Headache        Sore Throat    URI  This is a new problem. The current episode started in the past 7 days (Saturday, 4 days ago). The problem has been waxing and waning. The maximum temperature recorded prior to her arrival was 100.4 - 100.9 F. The fever has been present for Less than 1 day. Associated symptoms include congestion, coughing, headaches, rhinorrhea, sinus pain, sneezing, a sore throat and wheezing. Pertinent negatives include no abdominal pain, chest pain, diarrhea, dysuria, ear pain, joint pain, joint swelling, nausea, neck pain, plugged ear sensation, rash, swollen glands or vomiting. Treatments tried: tylenol. The treatment provided mild relief.  Her son was positive for COVID last Friday.     All review of systems negative except what is listed in the HPI       Objective:    BP 108/76 (BP Location: Left Arm, Patient Position: Sitting)   Pulse 88   Temp 97.9 F (36.6 C) (Oral)   Ht 5' (1.524 m)   Wt 119 lb (54 kg)   SpO2 97%   BMI 23.24 kg/m    Physical Exam Vitals reviewed.  Constitutional:      General: She is not in acute distress.    Appearance: She is well-developed and normal weight. She is not ill-appearing.  HENT:     Head: Normocephalic and atraumatic.     Mouth/Throat:     Mouth: Mucous membranes are moist.  Cardiovascular:     Heart sounds: Normal heart sounds.  Pulmonary:     Effort: Pulmonary effort is normal.     Breath sounds: Normal breath sounds.  Musculoskeletal:     Cervical back: Normal range of motion and neck supple.  Skin:    General: Skin is warm and dry.  Neurological:     Mental Status: She is alert and oriented to person, place, and time.  Psychiatric:        Mood and Affect: Mood normal.        Speech: Speech normal.        Behavior: Behavior normal.      No results found for any visits on 08/29/22.      Assessment & Plan:   Problem List Items Addressed This Visit   None Visit Diagnoses     COVID-19    -  Primary Adding prednisone for symptoms. Education attached to AVS.  OTC symptom management information added to AVS.  Continue supportive measures including rest, hydration, humidifier use, steam showers, warm compresses to sinuses, warm liquids with lemon and honey, and over-the-counter cough, cold, and analgesics as needed.   Patient aware of signs/symptoms requiring further/urgent evaluation.    Relevant Medications   predniSONE (DELTASONE) 20 MG tablet       Meds ordered this encounter  Medications   EPIPEN 2-PAK 0.3 MG/0.3ML SOAJ injection    Sig: Inject 0.3 mg into the muscle as needed for anaphylaxis.    Dispense:  1 each    Refill:  1    Order Specific Question:   Supervising Provider    Answer:   Danise Edge A [[id number]]   predniSONE (DELTASONE) 20 MG tablet    Sig: Take 2 tablets (40 mg total) by mouth daily with breakfast for 5 days.  Dispense:  10 tablet    Refill:  0    Order Specific Question:   Supervising Provider    Answer:   Danise Edge A [[id number]]    Return if symptoms worsen or fail to improve.  Clayborne Dana, NP

## 2022-08-29 NOTE — Patient Instructions (Addendum)
Adding prednisone to help with your symptoms.  Continue supportive measures including rest, hydration, humidifier use, steam showers, warm compresses to sinuses, warm liquids with lemon and honey, and over-the-counter cough, cold, and analgesics as needed.    Over the counter medications that may be helpful for symptoms:  Guaifenesin 1200 mg extended release tabs twice daily, with plenty of water For cough and congestion Brand name: Mucinex   Pseudoephedrine 30 mg, one or two tabs every 4 to 6 hours For sinus congestion Brand name: Sudafed You must get this from the pharmacy counter.  Oxymetazoline nasal spray each morning, one spray in each nostril, for NO MORE THAN 3 days  For nasal and sinus congestion Brand name: Afrin Saline nasal spray or Saline Nasal Irrigation 3-5 times a day For nasal and sinus congestion Brand names: Ocean or AYR Fluticasone nasal spray, one spray in each nostril, each morning (after oxymetazoline and saline, if used) For nasal and sinus congestion Brand name: Flonase Warm salt water gargles  For sore throat Every few hours as needed Alternate ibuprofen 400-600 mg and acetaminophen 1000 mg every 4-6 hours For fever, body aches, headache Brand names: Motrin or Advil and Tylenol Dextromethorphan 12-hour cough version 30 mg every 12 hours  For cough Brand name: Delsym Stop all other cold medications for now (Nyquil, Dayquil, Tylenol Cold, Theraflu, etc) and other non-prescription cough/cold preparations. Many of these have the same ingredients listed above and could cause an overdose of medication.   Herbal treatments that have been shown to be helpful in some patients include: Vitamin C 1000mg  per day Vitamin D 4000iU per day Zinc 100mg  per day Quercetin 25-500mg  twice a day Melatonin 5-10mg  at bedtime  General Instructions Allow your body to rest Drink PLENTY of fluids Isolate yourself from everyone, even family, until test results have  returned  If your COVID-19 test is positive Then you ARE INFECTED and you can pass the virus to others You must quarantine from others for a minimum of  5 days since symptoms started AND You are fever free for 24 hours WITHOUT any medication to reduce fever AND Your symptoms are improving Wear mask for the next 5 days If not improved by day 5, quarantine for the full 10 days. Do not go to the store or other public areas Do not go around household members who are not known to be infected with COVID-19 If you MUST leave your area of quarantine (example: go to a bathroom you share with others in your home), you must Wear a mask Wash your hands thoroughly Wipe down any surfaces you touch Do not share food, drinks, towels, or other items with other persons Dispose of your own tissues, food containers, etc  Once you have recovered, please continue good preventive care measures, including:  wearing a mask when in public wash your hands frequently avoid touching your face/nose/eyes cover coughs/sneezes with the inside of your elbow stay out of crowds keep a 6 foot distance from others  If you develop severe shortness of breath, uncontrolled fevers, coughing up blood, confusion, chest pain, or signs of dehydration (such as significantly decreased urine amounts or dizziness with standing) please go to the ER.

## 2022-08-30 ENCOUNTER — Ambulatory Visit: Payer: Medicaid Other | Admitting: Physical Therapy

## 2022-08-31 DIAGNOSIS — J358 Other chronic diseases of tonsils and adenoids: Secondary | ICD-10-CM | POA: Diagnosis not present

## 2022-08-31 DIAGNOSIS — J309 Allergic rhinitis, unspecified: Secondary | ICD-10-CM | POA: Diagnosis not present

## 2022-08-31 DIAGNOSIS — K219 Gastro-esophageal reflux disease without esophagitis: Secondary | ICD-10-CM | POA: Diagnosis not present

## 2022-08-31 NOTE — Therapy (Incomplete)
OUTPATIENT PHYSICAL THERAPY TREATMENT NOTE   Patient Name: Kaitlyn Crane MRN: [id number] DOB:27-Sep-1996, 26 y.o., female Today's Date: 08/31/2022   PCP: [name], FNP   REFERRING PROVIDER: [name], FNP  END OF SESSION:                Past Medical History:  Diagnosis Date   Infection 10/15/2010   UTERUS ?   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  Rationale for Evaluation and Treatment Rehabilitation  PERTINENT HISTORY: Exercise induced asthma, pt also reports she received epidural 9 years ago without effect during delivery of child  PRECAUTIONS: none  SUBJECTIVE:  ***  *** Pt arrives with minimal ankle/foot pain, states her back is a little more aggravated today but attributes to setting up a cubicle over the weekend and being more active. States she felt good after last session, no other new updates.     PAIN: Are you having pain: yes, 2/10 in ankle, 5/10 in back Location: RLE, lateral thigh down to ankle, low back How would you describe your pain?  Aching, tingling Best in past week: 0/10 ankle, 0/10 back Worst in past week: 6/10 ankle/LE and low back Aggravating factors: standing/walking >1 hr, sitting >2 hr, driving, working out Easing factors: ice, heat, icy hot lying down/resting    OBJECTIVE: (objective measures completed at initial evaluation unless otherwise dated)   DIAGNOSTIC FINDINGS:  Lumbar Xray 05/15/2022 FINDINGS: Normal anatomic alignment. No evidence for acute fracture or dislocation. Preservation of the vertebral body and intervertebral disc space heights. SI joints unremarkable. Intrauterine device is present.   IMPRESSION: Negative.   PATIENT SURVEYS:  LEFS 51/80 08/06/22 LEFS: 56/80   SCREENING FOR RED FLAGS: negative   COGNITION:           Overall cognitive status: Within functional limits for tasks assessed                          SENSATION: LT increased on RLE vs LLE in all dermatomes     POSTURE: No Significant postural limitations   PALPATION: Deferred on eval due to time constraints 07/02/22: TTP R gastroc, anterior tibialis, and distal quad, same on LLE but reduced tenderness   LUMBAR ROM:    Active  A/PROM  eval  Flexion WFL  Extension WFL  Right rotation WFL  Left rotation WFL   (Blank rows = not tested) Comments: No pain with lumbar ROM although pt does report stretching in abdomen w/ lumbar extension   LOWER EXTREMITY MMT:     MMT Right  eval Left eval Right/Left 08/06/22  Hip flexion 3+ 3+ 4/4-  Hip abduction 5 5   Hip internal rotation 3+ 3+ 4-/4  Hip external rotation 3+ 3+ 4-/4  Knee flexion 4 4 4/4+  Knee extension 4 4 4/4+  Ankle dorsiflexion 4+, p! 4 5/5  Ankle inversion 4, p! 4 5p!/5  Ankle eversion 4 4    (Blank rows = not tested) Comments: Pain in thigh w B MMT, R more sensitive than L. Most discomfort in anterior/medial ankle with MMT     FUNCTIONAL TESTS:  Slump + on R negative on L  08/06/22: slump + R    GAIT: Distance walked: within  clinic Assistive device utilized: None Level of assistance: Complete Independence Comments: Gait mechanics grossly WNL, no evidence of instability or significant impairment although pt does report reduced tolerance to ambulation       TODAY'S TREATMENT  [facility] Adult PT Treatment:                                                DATE: 09/03/22 Therapeutic Exercise: *** Manual Therapy: *** Neuromuscular re-ed: *** Therapeutic Activity: *** Modalities: *** Self Care: [facility] Adult PT Treatment:                                                DATE: 08/22/22 Therapeutic Exercise: Pilates Reformer used for LE/core strength, postural strength, lumbopelvic disassociation and core control.  Exercises included: Neutral spine, Transverse Abd , breathing , setup  Footwork 2 red 1 blue  Double leg press out on heels, parallel and turnout and then on toes , to heel raise  Single leg press 2 red 1 blue .  Rt LE easier than Lt.  Bridging all springs x 10 with ball , articulating  90/90 core: alt. Knee extension  Dead bug x 10    Feet in Straps 1 red 1 yellow Arcs in parallel and then in turnout Frog squats x 10 cues to connect inner thighs   Self Care: PIlates, core, neutral spine, breathing Importance of core in HEP POC   [facility] Adult PT Treatment:                                   DATE:08/20/22 Therapeutic Exercise: Black theraband x10 4 way Standing heel raises + 2inch step for deficit, x12, cues for pacing  Long lever squats 5# DB, 3x5 with cues for increased ascent velocity and slow descent 10# KB deadlift from 10inch step, 2x8 Runners step up 6inch w 5# DB contralateral UE, 2x8, cues for posture and increased hip flexion in swing limb Bird dog x8 B, cues for reduced compensation at pelvis Ardine Eng pose 2x66mn, cues for breath control Cat/camel x15, cues for form and pacing Cervicothoracic rotation x10 B, cues for form  Swiss ball fwd rollout x10        PATIENT EDUCATION:   Education details:rationale for interventions Person educated: Patient Education method: Explanation, Demonstration, Tactile cues, Verbal cues Education comprehension: verbalized understanding, returned demonstration, verbal cues required, tactile cues required, and needs further education      HOME EXERCISE PROGRAM: Access Code: [email or web address] URL:  [email or web address] Date: 07/16/2022 Prepared by: Enis Slipper  Access Code: [email or web address] URL: [email or web address] Date: 08/22/2022 Prepared by: Raeford Razor  Exercises - Seated Ankle Eversion with Resistance  - 2-3 x daily - 7 x weekly - 1 sets - 8 reps - Seated Ankle Plantar Flexion with Resistance Loop  - 2-3 x daily - 7 x weekly - 1 sets - 8 reps - Seated Ankle Inversion with Resistance  - 2-3 x daily - 7 x weekly - 1 sets - 8 reps - Sit to Stand  - 2-3 x daily - 7 x weekly - 1 sets - 8 reps - Seated Transversus Abdominis Bracing  - 1 x daily - 7 x weekly - 2 sets - 10 reps - 5 hold - Supine Bridge  - 1 x daily - 7 x weekly - 2 sets - 10 reps - 5 hold - Supine Dead Bug with Leg Extension  - 1 x daily - 7 x weekly - 2 sets - 10 reps - 5 hold ASSESSMENT:   CLINICAL IMPRESSION: ***  *** Patient arrived eager to begin Pilates introductory session.  She reports no pain but a moderate amount of soreness in her low back and bilateral thighs from the last visit.  Brief introduction to Pilates principles was offered.  Patient has no previous experience and has never been exposed to Pilates equipment.  She needed cues to find neutral spine and reported she had not been doing much exercise since beginning therapy.  She does report fear surrounding exercise given her history.  Provided reassurance as she performed level 1 basic exercises on the reformer.  She needed cueing to maintain neutral spine when hips go into flexion as well as avoiding knees collapsing inward with foot work.  Overall no increased pain just some leg  fatigue.  She would like to change one of her appointments each week to Pilates depending on how she feels after today.  For some basic core exercises to do and reinforced the need to perform HEP at home for long-term success.   OBJECTIVE IMPAIRMENTS decreased activity tolerance, decreased endurance, decreased mobility, difficulty walking, decreased strength, and pain.    ACTIVITY LIMITATIONS carrying, lifting, sitting, standing, squatting, sleeping, and toileting   PARTICIPATION LIMITATIONS: cleaning, laundry, driving, and community activity   PERSONAL FACTORS Past/current experiences and Time since onset of injury/illness/exacerbation are also affecting patient's functional outcome.    REHAB POTENTIAL: Good   CLINICAL DECISION MAKING: Evolving/moderate complexity   EVALUATION COMPLEXITY: Moderate     GOALS: Goals reviewed with patient? Yes   SHORT TERM GOALS: Target date: 07/19/2022   Pt will demonstrate appropriate understanding and performance of initially prescribed HEP in order to facilitate improved independence with management of symptoms.  Baseline: HEP provided on eval 08/06/22: able to perform familiar HEP w/o assist Goal status: MET   2.  Pt will score >60/80 on LEFS in order to indicate improved perception of function due to symptoms.  Baseline: 51/80 08/06/22: 56/80 Goal status: PROGRESSING     LONG TERM GOALS: Target date: 09/07/2022 (extended on 08/06/22)   Pt will score 70 or above on LEFS in order to demonstrate improved perception of functional status due to symptoms.  Baseline: 51/80 08/06/22: 56/80 Goal status: PROGRESSING   2.  Pt will demonstrate B hip ER/IR MMT of 4/5 in order to demonstrate improved strength for functional movements.  Baseline: 3+/5 B  08/06/22: see MMT chart above Goal status: PROGRESSING   3. Pt will report ability to  walk/stand for >3mn with less than 4/10 pain in order to maximize tolerance to community and social activities.   Baseline: 10-15 min 08/06/22: up to 6/10 pain with walk/stand 1 hr Goal status: PROGRESSING   4. Pt will report ability to sit for >1hr with less than 4/10 pain in order to promote tolerance to driving for family activities.             Baseline: 342m  08/06/22: up to 6/10 with sitting up to 2 hrs            Goal status: PROGRESSING     PLAN: PT FREQUENCY: 2x/week    PT DURATION: 4 weeks   PLANNED INTERVENTIONS: Therapeutic exercises, Therapeutic activity, Neuromuscular re-education, Balance training, Gait training, Patient/Family education, Self Care, Joint mobilization, Joint manipulation, Stair training, Aquatic Therapy, Dry Needling, Spinal manipulation, Spinal mobilization, Cryotherapy, Moist heat, Manual therapy, and Re-evaluation.   PLAN FOR NEXT SESSION:  *** pilates. Core strength/stability.     DaLeeroy ChaT, DPT 08/31/2022 7:37 AM

## 2022-09-03 ENCOUNTER — Ambulatory Visit: Payer: Medicaid Other | Admitting: Physical Therapy

## 2022-09-03 DIAGNOSIS — F3132 Bipolar disorder, current episode depressed, moderate: Secondary | ICD-10-CM | POA: Diagnosis not present

## 2022-09-04 ENCOUNTER — Telehealth: Payer: Self-pay | Admitting: Family

## 2022-09-04 NOTE — Telephone Encounter (Signed)
Patient states the dermatologist office she was referred to has an 8 month wait time. She would like to be referred to another office. Please advise.

## 2022-09-04 NOTE — Telephone Encounter (Signed)
I have called the pt and informed her that we have sent there referral to another office but Dermatology is booked out very far. She can also call the number on the back of the card to see who will accept her insurance. Pt stated understanding.

## 2022-09-05 ENCOUNTER — Ambulatory Visit: Payer: Medicaid Other | Admitting: Physical Therapy

## 2022-09-18 DIAGNOSIS — Z23 Encounter for immunization: Secondary | ICD-10-CM | POA: Diagnosis not present

## 2022-09-19 ENCOUNTER — Encounter: Payer: Medicaid Other | Attending: Family | Admitting: Registered"

## 2022-09-19 ENCOUNTER — Encounter: Payer: Self-pay | Admitting: Registered"

## 2022-09-19 DIAGNOSIS — Z713 Dietary counseling and surveillance: Secondary | ICD-10-CM | POA: Insufficient documentation

## 2022-09-19 NOTE — Patient Instructions (Signed)
-   Replace Ensure High Protein with Ensure Plus.   - Continue to have 3 meals + 3 snacks a day.  - Reduce eating spicy foods to help with acid reflux.  Aim to not lie down within 2-3 hours after eating.

## 2022-09-19 NOTE — Progress Notes (Signed)
Appointment start time: 8:30  Appointment end time: 9:15  Patient was seen on 09/19/2022 for nutrition counseling pertaining to disordered eating  Primary care provider: Ria Clock, FNP Therapist: Aundra Millet (sees 2x/week; [facility]; has appt 9/14 with therapist, and Deatra Robinson, MD-psychiatrist on 9/18)   ROI: 08/21/2022 Any other medical team members: none   Assessment  Pt arrives stating she has acid reflux; now taking omeprazole. States she likes to eat a lot of spicy foods. States she was told a few weeks ago not to eat spicy foods or seafood. States she has not stopped eating these things because likes them. States she will work on it. States she has increased her eating and even her family has noticed. States her doctor says her weight goal is 120 lbs.  States her son has a new schedule and she is still adjusting to it; yesterday didn't eat breakfast before taking him to school as usual.   Growth Metrics: Goal rate of weight gain:  0.5-1.0 lb/week  Eating history: Length of time: 1 years Previous treatments: none Goals for RD meetings: improve hair shedding, dizziness/lightheadedness, headaches, focus/concentration, cold intolerance   Weight history:  Today's weight: 116.2  Change from previous visit: +0.6 lb from previous visit 115.6 lbs on 08/21/2022 (4 weeks ago) Highest weight: 115; while pregnant 129  Lowest weight: 90 Most consistent weight: 104-106 What would you like to weigh: no How has weight changed in the past year: weight loss  Medical Information:  Changes in hair, skin, nails since ED started: continued hair shedding, yellowish skin, brittle nails, easily bruised, easily bleeds Chewing/swallowing difficulties: yes, due to acid reflux Reflux or heartburn: yes, now taking omeprazole Trouble with teeth: has 6 cavities, has trouble with grinding teeth since childhood LMP without the use of hormones: 11/12  Weight at that point:  Effect of exercise on  menses: none reported   Effect of hormones on menses: N/A; just had Mirena removed on 8/9, has had it for 9 years Constipation, diarrhea: has BM once/day or once/every other day Dizziness/lightheadedness: sometimes; has improved  Headaches/body aches: no, has improved; body aches have improved as well due to physical therapist Heart racing/chest pain: no, has improved Mood: has more energy during the day Sleep: 7 hrs/night; feels more rested, has improved Focus/concentration: no, has improved  Cold intolerance: no, has improved Vision changes: sometimes, blurred vision  Mental health diagnosis: depression, psychotic disorder, bipolar   Dietary assessment: A typical day consists of 3 meals and 2 snacks  Safe foods include: Ramen noodles, stir fry/saute meat & vegetables, white rice, Pha, noodles, hot soupy, burgers, fish, pork, chicken, watermelon  Avoided foods include: blue crab, condiments, fish filets, beef (limits to a few times a year), bread   24 hour recall: shrimp B (10 am): sausage, egg biscuit + yogurt + water  S: L (12:30 pm): soup (rice + eggplant + pork)  S: venti iced chai tea latte + granola bar D (6 pm): tomato based soup (noodles + pork + tomatoes)  S (9 pm): Ensure (high protein)  Beverages: water (2*16 oz; 32 oz), Ensure (3*8 oz; 24 oz), strawberry lemonade ((21   Physical activity: none reported  What Methods Do You Use To Control Your Weight (Compensatory behaviors)?           Restricting   Estimated energy intake: 1600-1700 kcal  Estimated energy needs: 2000-2200 kcal 250-275 g CHO 150-165 g pro 44-49 g fat  Nutrition Diagnosis: NB-1.5 Disordered eating pattern As  related to skipping meals.  As evidenced by pt reports intentionally skipping meals at times when depressed.  Intervention/Goals: Pt was encouraged with changes made from previous visit. Discussed correlation between increased nourishment and recent improvement in signs/symptoms.  Discussed ways to continue to increase nourishment. Provided handout: High Calorie, High Protein Nutrition Therapy. Pt agreed with goals listed. Goals: - Replace Ensure High Protein with Ensure Plus.  - Continue to have 3 meals + 3 snacks a day. - Reduce eating spicy foods to help with acid reflux.  Aim to not lie down within 2-3 hours after eating.   Meal plan:    3 meals    2 snacks   Monitoring and Evaluation: Patient will follow up in 6 weeks.

## 2022-09-25 ENCOUNTER — Telehealth: Payer: Self-pay | Admitting: Family

## 2022-09-25 ENCOUNTER — Other Ambulatory Visit: Payer: Medicaid Other | Admitting: Licensed Clinical Social Worker

## 2022-09-25 ENCOUNTER — Other Ambulatory Visit: Payer: Self-pay | Admitting: Family

## 2022-09-25 DIAGNOSIS — F329 Major depressive disorder, single episode, unspecified: Secondary | ICD-10-CM

## 2022-09-25 DIAGNOSIS — F29 Unspecified psychosis not due to a substance or known physiological condition: Secondary | ICD-10-CM

## 2022-09-25 DIAGNOSIS — F319 Bipolar disorder, unspecified: Secondary | ICD-10-CM

## 2022-09-25 NOTE — Telephone Encounter (Signed)
Pt would like to be referred to [facility], for a further diagnosis on her bipolar disorder.  [address]  [place], [place] [address] [phone]

## 2022-09-25 NOTE — Patient Instructions (Signed)
Visit Information  Kaitlyn Crane was given information about Medicaid Managed Care team care coordination services as a part of their [place] Medicaid benefit. Kaitlyn Crane verbally consented to engagement with the [facility] Managed Care team.   If you are experiencing a medical emergency, please call 911 or report to your local emergency department or urgent care.   If you have a non-emergency medical problem during routine business hours, please contact your provider's office and ask to speak with a nurse.   For questions related to your [facility], please call: [phone] or visit the homepage here: [email or web address]  If you would like to schedule transportation through your [facility], please call the following number at least 2 days in advance of your appointment: [phone]   Rides for urgent appointments can also be made after hours by calling [employer].  Call [facility] at [phone], at any time, 24 hours a day, 7 days a week. If you are in danger or need immediate medical attention call 911.  If you would like help to quit smoking, call 1-800-QUIT-NOW (705) 746-1010) OR Espaol: 1-855-Djelo-Ya (8-127-517-0017) o para ms informacin haga clic aqu or Text READY to 200-400 to register via text   Following is a copy of your plan of care:  Care Plan : LCSW Plan of Care  Updates made by Greg Cutter, LCSW since 09/25/2022 12:00 AM     Problem: Depression Identification (Depression)      Long-Range Goal: Depressive Symptoms Identified   Start Date: 09/25/2022  Note:    Priority: High  Timeframe:  Long-Range Goal Priority:  High Start Date:   09/25/22            Expected End Date:  ongoing                     Follow Up Date--10/10/22 at 7  - keep 90 percent of scheduled appointments -consider counseling or  psychiatry -consider bumping up your self-care  -consider creating a stronger support network   Why is this important?             Combatting depression may take some time.            If you don't feel better right away, don't give up on your treatment plan.    Current barriers:   Chronic Mental Health needs related to bipolar, depression and stress. Patient requires Support, Education, Resources, Referrals, Advocacy, and Care Coordination, in order to meet Unmet Mental Health Needs. Patient will implement clinical interventions discussed today to decrease symptoms of depression and increase knowledge and/or ability of: coping skills. Mental Health Concerns and Social Isolation Patient lacks knowledge of available community counseling agencies and resources. Lack of a stable support network  Clinical Goal(s): verbalize understanding of plan for management of Bipolar, Depression, and Stress and demonstrate a reduction in symptoms. Patient will connect with a provider for ongoing mental health treatment, increase coping skills, healthy habits, self-management skills, and stress reduction        "When your car dies or a deadline looms, how do you respond? Long-term, low-grade or acute stress takes a serious toll on your body and mind, so don't ignore feelings of constant tension. Stress is a natural part of life. However, too much stress can harm our health, especially if it continues every day. This is chronic stress and can put you at risk for heart problems like  heart disease and depression. Understand what's happening inside your body and learn simple coping skills to combat the negative impacts of everyday stressors.  Types of Stress There are two types of stress: Emotional - types of emotional stress are relationship problems, pressure at work, financial worries, experiencing discrimination or having a major life change. Physical - Examples of physical stress include being sick having pain,  not sleeping well, recovery from an injury or having an alcohol and drug use disorder. Fight or Flight Sudden or ongoing stress activates your nervous system and floods your bloodstream with adrenaline and cortisol, two hormones that raise blood pressure, increase heart rate and spike blood sugar. These changes pitch your body into a fight or flight response. That enabled our ancestors to outrun saber-toothed tigers, and it's helpful today for situations like dodging a car accident. But most modern chronic stressors, such as finances or a challenging relationship, keep your body in that heightened state, which hurts your health. Effects of Too Much Stress If constantly under stress, most of us will eventually start to function less well.  Multiple studies link chronic stress to a higher risk of heart disease, stroke, depression, weight gain, memory loss and even premature death, so it's important to recognize the warning signals. Talk to your doctor about ways to manage stress if you're experiencing any of these symptoms: Prolonged periods of poor sleep. Regular, severe headaches. Unexplained weight loss or gain. Feelings of isolation, withdrawal or worthlessness. Constant anger and irritability. Loss of interest in activities. Constant worrying or obsessive thinking. Excessive alcohol or drug use. Inability to concentrate.  10 Ways to Cope with Chronic Stress It's key to recognize stressful situations as they occur because it allows you to focus on managing how you react. We all need to know when to close our eyes and take a deep breath when we feel tension rising. Use these tips to prevent or reduce chronic stress. 1. Rebalance Work and Home All work and no play? If you're spending too much time at the office, intentionally put more dates in your calendar to enjoy time for fun, either alone or with others. 2. Get Regular Exercise Moving your body on a regular basis balances the nervous system  and increases blood circulation, helping to flush out stress hormones. Even a daily 20-minute walk makes a difference. Any kind of exercise can lower stress and improve your mood ? just pick activities that you enjoy and make it a regular habit. 3. Eat Well and Limit Alcohol and Stimulants Alcohol, nicotine and caffeine may temporarily relieve stress but have negative health impacts and can make stress worse in the long run. Well-nourished bodies cope better, so start with a good breakfast, add more organic fruits and vegetables for a well-balanced diet, avoid processed foods and sugar, try herbal tea and drink more water. 4. Connect with Supportive People Talking face to face with another person releases hormones that reduce stress. Lean on those good listeners in your life. 5. Carve Out Hobby Time Do you enjoy gardening, reading, listening to music or some other creative pursuit? Engage in activities that bring you pleasure and joy; research shows that reduces stress by almost half and lowers your heart rate, too. 6. Practice Meditation, Stress Reduction or Yoga Relaxation techniques activate a state of restfulness that counterbalances your body's fight-or-flight hormones. Even if this also means a 10-minute break in a long day: listen to music, read, go for a walk in nature, do a hobby, take a   bath or spend time with a friend. Also consider doing a mindfulness exercise or try a daily deep breathing or imagery practice. Deep Breathing Slow, calm and deep breathing can help you relax. Try these steps to focus on your breathing and repeat as needed. Find a comfortable position and close your eyes. Exhale and drop your shoulders. Breathe in through your nose; fill your lungs and then your belly. Think of relaxing your body, quieting your mind and becoming calm and peaceful. Breathe out slowly through your nose, relaxing your belly. Think of releasing tension, pain, worries or distress. Repeat steps  three and four until you feel relaxed. Imagery This involves using your mind to excite the senses -- sound, vision, smell, taste and feeling. This may help ease your stress. Begin by getting comfortable and then do some slow breathing. Imagine a place you love being at. It could be somewhere from your childhood, somewhere you vacationed or just a place in your imagination. Feel how it is to be in the place you're imagining. Pay attention to the sounds, air, colors, and who is there with you. This is a place where you feel cared for and loved. All is well. You are safe. Take in all the smells, sounds, tastes and feelings. As you do, feel your body being nourished and healed. Feel the calm that surrounds you. Breathe in all the good. Breathe out any discomfort or tension. 7. Sleep Enough If you get less than seven to eight hours of sleep, your body won't tolerate stress as well as it could. If stress keeps you up at night, address the cause, and add extra meditation into your day to make up for the lost z's. Try to get seven to nine hours of sleep each night. Make a regular bedtime schedule. Keep your room dark and cool. Try to avoid computers, TV, cell phones and tablets before bed. 8. Bond with Connections You Enjoy Go out for a coffee with a friend, chat with a neighbor, call a family member, visit with a clergy member, or even hang out with your pet. Clinical studies show that spending even a short time with a companion animal can cut anxiety levels almost in half. 9. Take a Vacation Getting away from it all can reset your stress tolerance by increasing your mental and emotional outlook, which makes you a happier, more productive person upon return. Leave your cellphone and laptop at home! 10. See a Counselor, Coach or Therapist If negative thoughts overwhelm your ability to make positive changes, it's time to seek professional help. Make an appointment today--your health and life are worth  it."   Depression screen reviewed  PHQ2/ PHQ9 completed or reviewed  Mindfulness or Relaxation training provided Active listening / Reflection utilized  Advance Care and HCPOA education provided Emotional Support Provided Problem Solving /Task Center strategies reviewed Provided psychoeducation for mental health needs  Provided brief CBT  Reviewed mental health medications and discussed importance of compliance:  Quality of sleep assessed & Sleep Hygiene techniques promoted  Participation in counseling encouraged  Verbalization of feelings encouraged  Suicidal Ideation/Homicidal Ideation assessed: Patient denies SI/HI  Review resources, discussed options and provided patient information about  Mental Health Resources Inter-disciplinary care team collaboration (see longitudinal plan of care) Patient Goals/Self-Care Activities: Over the next 120 days Attend scheduled medical appointments Utilize healthy coping skills and supportive resources discussed Contact PCP with any questions or concerns Keep 90 percent of counseling appointments Call your insurance provider for more information   about your Enhanced Benefits  Check out counseling resources provided  Begin personal counseling with LCSW, to reduce and manage symptoms of Depression and Stress, until well-established with mental health provider Accept all calls from representative with [facility] in an effort to establish ongoing mental health counseling and supportive services. Incorporate into daily practice - relaxation techniques, deep breathing exercises, and mindfulness meditation strategies. Talk about feelings with friends, family members, spiritual advisor, etc. Contact LCSW directly [phone]), if you have questions, need assistance, or if additional social work needs are identified between now and our next scheduled telephone outreach call. Call [phone] for mental health hotline/crisis line if needed (24/7 available) Try  techniques to reduce symptoms of anxiety/negative thinking (deep breathing, distraction, positive self talk, etc)  - develop a personal safety plan - develop a plan to deal with triggers like holidays, anniversaries - exercise at least 2 to 3 times per week - have a plan for how to handle bad days - journal feelings and what helps to feel better or worse - spend time or talk with others at least 2 to 3 times per week - watch for early signs of feeling worse - begin personal counseling - call and visit an old friend - check out volunteer opportunities - join a support group - laugh; watch a funny movie or comedian - learn and use visualization or guided imagery - perform a random act of kindness - practice relaxation or meditation daily - start or continue a personal journal - practice positive thinking and self-talk -continue with compliance of taking medication  -identify current effective and ineffective coping strategies.  -implement positive self-talk in care to increase self-esteem, confidence and feelings of control.  -consider alternative and complementary therapy approaches such as meditation, mindfulness or yoga.  -journaling, prayer, worship services, meditation or pastoral counseling.  -increase participation in pleasurable group activities such as hobbies, singing, sports or volunteering).  -consider the use of meditative movement therapy such as tai chi, yoga or qigong.  -start a regular daily exercise program based on tolerance, ability and patient choice to support positive thinking and activity    If you are experiencing a Mental Health or [place] or need someone to talk to, please call the Suicide and Crisis Lifeline: 988    Patient Goals: Initial goal

## 2022-09-25 NOTE — Patient Outreach (Signed)
Medicaid Managed Care Social Work Note  09/25/2022 Name:  Kaitlyn Crane MRN:  [id number] DOB:  [birth year]  Kaitlyn Crane is an 26 y.o. year old female who is a primary patient of Kaitlyn Crane, Kaitlyn Crane.  The Medicaid Managed Care Coordination team was consulted for assistance with:  [place] and Resources  Kaitlyn Crane was given information about Medicaid Managed Care Coordination team services today. Kaitlyn Crane Patient agreed to services and verbal consent obtained.  Engaged with patient  for by telephone forinitial visit in response to referral for case management and/or care coordination services.   Assessments/Interventions:  Review of past medical history, allergies, medications, health status, including review of consultants reports, laboratory and other test data, was performed as part of comprehensive evaluation and provision of chronic care management services.  SDOH: (Social Determinant of Health) assessments and interventions performed: SDOH Interventions    Flowsheet Row Patient Outreach Telephone from 09/25/2022 in [place] [facility] from 05/29/2022 in [facility]  SDOH Interventions    Depression Interventions/Treatment  Counseling, Currently on Treatment Counseling  Stress Interventions Offered [employer], Provide Counseling --       Advanced Directives Status:  See Care Plan for related entries.  Care Plan                 Allergies  Allergen Reactions   Shellfish Allergy Other (See Comments)    Swelling of tonsils   Carbamide Peroxide Rash   Nickel Sulfate [Nickel] Rash    Medications Reviewed Today     Reviewed by Greg Cutter, LCSW (Social Worker) on 09/25/22 at 1009  Med List Status: <None>   Medication Order Taking? Sig Documenting Provider Last Dose Status Informant  EPIPEN 2-PAK 0.3 MG/0.3ML SOAJ injection [id number]  Inject 0.3 mg into the muscle as needed for  anaphylaxis. [name], NP  Active   lurasidone (LATUDA) 20 MG TABS tablet [id number] No Take by mouth. [provider] Taking Active   Multiple Vitamin (MULTIVITAMIN WITH MINERALS) TABS tablet [id number] No Take 1 tablet by mouth daily. [name], MD Taking Active   mupirocin ointment (BACTROBAN) 2 % [id number] No Apply 1 Application topically 2 (two) times daily as needed (extranal use only). Kaitlyn Salvage, FNP Taking Active   nitrofurantoin (MACRODANTIN) 50 MG capsule [id number] No Take 50 mg by mouth daily. [provider] Taking Active   omeprazole (PRILOSEC OTC) 20 MG tablet [id number]  Take 20 mg by mouth 2 (two) times daily before a meal. [provider]  Active   VENTOLIN HFA 108 (90 Base) MCG/ACT inhaler [id number] No Inhale 2 puffs into the lungs every 6 (six) hours as needed for wheezing or shortness of breath. [name], MD Taking Active   vitamin B-12 1000 MCG tablet [id number] No Take 1 tablet (1,000 mcg total) by mouth daily. [name], MD Taking Active   Vitamin D, Ergocalciferol, (DRISDOL) 1.25 MG (50000 UNIT) CAPS capsule [id number] No Take 50,000 Units by mouth every 7 (seven) days. 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   Encounter for insertion of mirena IUD - inserted 12/12/12 12/12/2012   SVD (spontaneous vaginal delivery) 09/23/2012   Chorioamnionitis 09/23/2012   Late prenatal care 08/27/2012   Normal pregnancy, first 08/11/2012   Anemia 09/17/2011    Conditions to be  addressed/monitored per PCP order:  Depression and Bipolar Disorder  Care Plan : LCSW Plan of Care  Updates made by Greg Cutter, LCSW since 09/25/2022 12:00 AM     Problem: Depression Identification (Depression)      Long-Range Goal: Depressive Symptoms Identified   Start Date: 09/25/2022  Note:    Priority: High  Timeframe:  Long-Range Goal Priority:  High Start Date:   09/25/22            Expected End Date:  ongoing                     Follow Up Date--10/10/22 at 27  - keep 90 percent of scheduled appointments -consider counseling or psychiatry -consider bumping up your self-care  -consider creating a stronger support network   Why is this important?             Combatting depression may take some time.            If you don't feel better right away, don't give up on your treatment plan.    Current barriers:   Chronic Mental Health needs related to bipolar, depression and stress. Patient requires Support, Education, Resources, Referrals, Advocacy, and Care Coordination, in order to meet Unmet Mental Health Needs. Patient will implement clinical interventions discussed today to decrease symptoms of depression and increase knowledge and/or ability of: coping skills. Mental Health Concerns and Social Isolation Patient lacks knowledge of available community counseling agencies and resources. Lack of a stable support network  Clinical Goal(s): verbalize understanding of plan for management of Bipolar, Depression, and Stress and demonstrate a reduction in symptoms. Patient will connect with a provider for ongoing mental health treatment, increase coping skills, healthy habits, self-management skills, and stress reduction        Clinical Interventions:  Assessed patient's previous and current treatment, coping skills, support system and barriers to care. Patient provided hx  Verbalization of feelings encouraged, motivational interviewing employed Emotional support  provided, positive coping strategies explored. Establishing healthy boundaries to use with family emphasized and healthy self-care education provided. Patient has had issues in the past with family interfering with her mental health care.  Patient was educated on available mental health resources within their area that accept Medicaid and offer counseling and psychiatry. Email sent to patient today with instructions for scheduling at [facility] as well as some crisis support resources and [facility]'s walk in clinic hours Patient reports that she wishes to change her mental health providers from [facility] to [facility]. Patient reports that her niece is a strong and stable support for her.  Patient is agreeable to referral to [facility] for counseling and psychiatry. [facility] LCSW made referral on 09/25/22. LCSW provided education on relaxation techniques such as meditation, deep breathing, massage, grounding exercises or yoga that can activate the body's relaxation response and ease symptoms of stress and anxiety. LCSW ask that when pt is struggling with difficult emotions and racing thoughts that they start this relaxation response process. LCSW provided extensive education on healthy coping skills for anxiety. SW used active and reflective listening, validated patient's feelings/concerns, and provided emotional support. Patient will  work on implementing appropriate self-care habits into their daily routine such as: staying positive, writing a gratitude list, drinking water, staying active around the house, taking their medications as prescribed, combating negative thoughts or emotions and staying connected with their family and friends. Positive reinforcement provided for this decision to work on this. Patient reports that they experience both anxiety and depression. He shares that he feels alone most days. He was receptive to anxiety and depression management coping skill education. The following coping skill education was  provided for stress relief and mental health management: "When your car dies or a deadline looms, how do you respond? Long-term, low-grade or acute stress takes a serious toll on your body and mind, so don't ignore feelings of constant tension. Stress is a natural part of life. However, too much stress can harm our health, especially if it continues every day. This is chronic stress and can put you at risk for heart problems like heart disease and depression. Understand what's happening inside your body and learn simple coping skills to combat the negative impacts of everyday stressors.  Types of Stress There are two types of stress: Emotional - types of emotional stress are relationship problems, pressure at work, financial worries, experiencing discrimination or having a major life change. Physical - Examples of physical stress include being sick having pain, not sleeping well, recovery from an injury or having an alcohol and drug use disorder. Fight or Flight Sudden or ongoing stress activates your nervous system and floods your bloodstream with adrenaline and cortisol, two hormones that raise blood pressure, increase heart rate and spike blood sugar. These changes pitch your body into a fight or flight response. That enabled our ancestors to outrun saber-toothed tigers, and it's helpful today for situations like dodging a car accident. But most modern chronic stressors, such as finances or a challenging relationship, keep your body in that heightened state, which hurts your health. Effects of Too Much Stress If constantly under stress, most of [place] will eventually start to function less well.  Multiple studies link chronic stress to a higher risk of heart disease, stroke, depression, weight gain, memory loss and even premature death, so it's important to recognize the warning signals. Talk to your doctor about ways to manage stress if you're experiencing any of these symptoms: Prolonged periods of poor  sleep. Regular, severe headaches. Unexplained weight loss or gain. Feelings of isolation, withdrawal or worthlessness. Constant anger and irritability. Loss of interest in activities. Constant worrying or obsessive thinking. Excessive alcohol or drug use. Inability to concentrate.  10 Ways to Cope with Chronic Stress It's key to recognize stressful situations as they occur because it allows you to focus on managing how you react. We all need to know when to close our eyes and take a deep breath when we feel tension rising. Use these tips to prevent or reduce chronic stress. 1. Rebalance Work and Home All work and no play? If you're spending too much time at the office, intentionally put more dates in your calendar to enjoy time for fun, either alone or with others. 2. Get Regular Exercise Moving your body on a regular basis balances the nervous system and increases blood circulation, helping to flush out stress hormones. Even a daily 20-minute walk makes a difference. Any kind of exercise can lower stress and improve your mood ? just pick activities that you enjoy and make it a regular habit. 3. Eat Well and Limit Alcohol and Stimulants Alcohol, nicotine and caffeine  may temporarily relieve stress but have negative health impacts and can make stress worse in the long run. Well-nourished bodies cope better, so start with a good breakfast, add more organic fruits and vegetables for a well-balanced diet, avoid processed foods and sugar, try herbal tea and drink more water. 4. Connect with Supportive People Talking face to face with another person releases hormones that reduce stress. Lean on those good listeners in your life. 5. [place] Time Do you enjoy gardening, reading, listening to music or some other creative pursuit? Engage in activities that bring you pleasure and joy; research shows that reduces stress by almost half and lowers your heart rate, too. 6. Practice Meditation, Stress  Reduction or Yoga Relaxation techniques activate a state of restfulness that counterbalances your body's fight-or-flight hormones. Even if this also means a 10-minute break in a long day: listen to music, read, go for a walk in nature, do a hobby, take a bath or spend time with a friend. Also consider doing a mindfulness exercise or try a daily deep breathing or imagery practice. Deep Breathing Slow, calm and deep breathing can help you relax. Try these steps to focus on your breathing and repeat as needed. Find a comfortable position and close your eyes. Exhale and drop your shoulders. Breathe in through your nose; fill your lungs and then your belly. Think of relaxing your body, quieting your mind and becoming calm and peaceful. Breathe out slowly through your nose, relaxing your belly. Think of releasing tension, pain, worries or distress. Repeat steps three and four until you feel relaxed. Imagery This involves using your mind to excite the senses -- sound, vision, smell, taste and feeling. This may help ease your stress. Begin by getting comfortable and then do some slow breathing. Imagine a place you love being at. It could be somewhere from your childhood, somewhere you vacationed or just a place in your imagination. Feel how it is to be in the place you're imagining. Pay attention to the sounds, air, colors, and who is there with you. This is a place where you feel cared for and loved. All is well. You are safe. Take in all the smells, sounds, tastes and feelings. As you do, feel your body being nourished and healed. Feel the calm that surrounds you. Breathe in all the good. Breathe out any discomfort or tension. 7. Sleep Enough If you get less than seven to eight hours of sleep, your body won't tolerate stress as well as it could. If stress keeps you up at night, address the cause, and add extra meditation into your day to make up for the lost z's. Try to get seven to nine hours of sleep  each night. Make a regular bedtime schedule. Keep your room dark and cool. Try to avoid computers, TV, cell phones and tablets before bed. 8. Bond with Connections You Enjoy Go out for a coffee with a friend, chat with a neighbor, call a family member, visit with a clergy member, or even hang out with your pet. Clinical studies show that spending even a short time with a companion animal can cut anxiety levels almost in half. 9. Take a Vacation Getting away from it all can reset your stress tolerance by increasing your mental and emotional outlook, which makes you a happier, more productive person upon return. Leave your cellphone and laptop at home! 10. See a Counselor, Coach or Therapist If negative thoughts overwhelm your ability to make positive changes, it's time  to seek professional help. Make an appointment today--your health and life are worth it."   Depression screen reviewed  PHQ2/ PHQ9 completed or reviewed  Mindfulness or Relaxation training provided Active listening / Reflection utilized  Advance Care and HCPOA education provided Emotional Support Provided Problem [place] strategies reviewed Provided psychoeducation for mental health needs  Provided brief CBT  Reviewed mental health medications and discussed importance of compliance:  Quality of sleep assessed & Sleep Hygiene techniques promoted  Participation in counseling encouraged  Verbalization of feelings encouraged  Suicidal Ideation/Homicidal Ideation assessed: Patient denies SI/HI  Review resources, discussed options and provided patient information about  [place] care team collaboration (see longitudinal plan of care) Patient Goals/Self-Care Activities: Over the next 120 days Attend scheduled medical appointments Utilize healthy coping skills and supportive resources discussed Contact PCP with any questions or concerns Keep 90 percent of counseling appointments Call  your insurance provider for more information about your Enhanced Benefits  Check out counseling resources provided  Begin personal counseling with LCSW, to reduce and manage symptoms of Depression and Stress, until well-established with mental health provider Accept all calls from representative with [facility] in an effort to establish ongoing mental health counseling and supportive services. Incorporate into daily practice - relaxation techniques, deep breathing exercises, and mindfulness meditation strategies. Talk about feelings with friends, family members, spiritual advisor, etc. Contact LCSW directly [phone]), if you have questions, need assistance, or if additional social work needs are identified between now and our next scheduled telephone outreach call. Call 988 for mental health hotline/crisis line if needed (24/7 available) Try techniques to reduce symptoms of anxiety/negative thinking (deep breathing, distraction, positive self talk, etc)  - develop a personal safety plan - develop a plan to deal with triggers like holidays, anniversaries - exercise at least 2 to 3 times per week - have a plan for how to handle bad days - journal feelings and what helps to feel better or worse - spend time or talk with others at least 2 to 3 times per week - watch for early signs of feeling worse - begin personal counseling - call and visit an old friend - check out volunteer opportunities - join a support group - laugh; watch a funny movie or comedian - learn and use visualization or guided imagery - perform a random act of kindness - practice relaxation or meditation daily - start or continue a personal journal - practice positive thinking and self-talk -continue with compliance of taking medication  -identify current effective and ineffective coping strategies.  -implement positive self-talk in care to increase self-esteem, confidence and feelings of control.  -consider alternative and  complementary therapy approaches such as meditation, mindfulness or yoga.  -journaling, prayer, worship services, meditation or pastoral counseling.  -increase participation in pleasurable group activities such as hobbies, singing, sports or volunteering).  -consider the use of meditative movement therapy such as tai chi, yoga or qigong.  -start a regular daily exercise program based on tolerance, ability and patient choice to support positive thinking and activity    If you are experiencing a Mental Health or [place] or need someone to talk to, please call the Suicide and Crisis Lifeline: 988    Patient Goals: Initial goal      Follow up:  Patient agrees to Care Plan and Follow-up.  Plan: The Managed Medicaid care management team will reach out to the patient again over the next 30 days.  Date/time of next  scheduled Social Work care management/care coordination outreach:  10/10/22 at [place], [place], MSW, [place] Medicaid LCSW [email or web address] Phone: [phone]

## 2022-09-25 NOTE — Telephone Encounter (Signed)
Okay for referral?

## 2022-10-02 DIAGNOSIS — F331 Major depressive disorder, recurrent, moderate: Secondary | ICD-10-CM | POA: Diagnosis not present

## 2022-10-03 DIAGNOSIS — R3121 Asymptomatic microscopic hematuria: Secondary | ICD-10-CM | POA: Diagnosis not present

## 2022-10-03 DIAGNOSIS — N3021 Other chronic cystitis with hematuria: Secondary | ICD-10-CM | POA: Diagnosis not present

## 2022-10-10 ENCOUNTER — Other Ambulatory Visit: Payer: Medicaid Other | Admitting: Licensed Clinical Social Worker

## 2022-10-10 NOTE — Patient Instructions (Signed)
Visit Information  Kaitlyn Crane was given information about Medicaid Managed Care team care coordination services as a part of their [place] Medicaid benefit. Kaitlyn Crane verbally consented to engagement with the [facility] Managed Care team.   If you are experiencing a medical emergency, please call 911 or report to your local emergency department or urgent care.   If you have a non-emergency medical problem during routine business hours, please contact your provider's office and ask to speak with a nurse.   For questions related to your [facility], please call: [phone] or visit the homepage here: [email or web address]  If you would like to schedule transportation through your [facility], please call the following number at least 2 days in advance of your appointment: [phone]   Rides for urgent appointments can also be made after hours by calling Member Services.  Call the [facility] at [phone], at any time, 24 hours a day, 7 days a week. If you are in danger or need immediate medical attention call 911.  If you would like help to quit smoking, call 1-800-QUIT-NOW (601)042-9719) OR Espaol: 1-855-Djelo-Ya (9-390-300-9233) o para ms informacin haga clic aqu or Text READY to 200-400 to register via text  Following is a copy of your plan of care:  Care Plan : LCSW Plan of Care  Updates made by Kaitlyn Cutter, LCSW since 10/10/2022 12:00 AM     Problem: Depression Identification (Depression)      Long-Range Goal: Depressive Symptoms Identified   Start Date: 09/25/2022  Note:    Priority: High  Timeframe:  Long-Range Goal Priority:  High Start Date:   09/25/22            Expected End Date:  ongoing                     Follow Up Date--10/24/22 at 230  - keep 90 percent of scheduled appointments -consider counseling or  psychiatry -consider bumping up your self-care  -consider creating a stronger support network   Why is this important?             Combatting depression may take some time.            If you don't feel better right away, don't give up on your treatment plan.    Current barriers:   Chronic Mental Health needs related to bipolar, depression and stress. Patient requires Support, Education, Resources, Referrals, Advocacy, and Care Coordination, in order to meet Unmet Mental Health Needs. Patient will implement clinical interventions discussed today to decrease symptoms of depression and increase knowledge and/or ability of: coping skills. Mental Health Concerns and Social Isolation Patient lacks knowledge of available community counseling agencies and resources. Lack of a stable support network  Clinical Goal(s): verbalize understanding of plan for management of Bipolar, Depression, and Stress and demonstrate a reduction in symptoms. Patient will connect with a provider for ongoing mental health treatment, increase coping skills, healthy habits, self-management skills, and stress reduction        Patient Goals/Self-Care Activities: Over the next 120 days Attend scheduled medical appointments Utilize healthy coping skills and supportive resources discussed Contact PCP with any questions or concerns Keep 90 percent of counseling appointments Call your insurance provider for more information about your Enhanced Benefits  Check out counseling resources provided  Begin personal counseling with LCSW, to reduce and manage symptoms of Depression and Stress, until well-established with mental  health provider Accept all calls from representative with [facility] in an effort to establish ongoing mental health counseling and supportive services. Incorporate into daily practice - relaxation techniques, deep breathing exercises, and mindfulness meditation strategies. Talk about feelings with friends, family  members, spiritual advisor, etc. Contact LCSW directly [phone]), if you have questions, need assistance, or if additional social work needs are identified between now and our next scheduled telephone outreach call. Call 988 for mental health hotline/crisis line if needed (24/7 available) Try techniques to reduce symptoms of anxiety/negative thinking (deep breathing, distraction, positive self talk, etc)  - develop a personal safety plan - develop a plan to deal with triggers like holidays, anniversaries - exercise at least 2 to 3 times per week - have a plan for how to handle bad days - journal feelings and what helps to feel better or worse - spend time or talk with others at least 2 to 3 times per week - watch for early signs of feeling worse - begin personal counseling - call and visit an old friend - check out volunteer opportunities - join a support group - laugh; watch a funny movie or comedian - learn and use visualization or guided imagery - perform a random act of kindness - practice relaxation or meditation daily - start or continue a personal journal - practice positive thinking and self-talk -continue with compliance of taking medication  -identify current effective and ineffective coping strategies.  -implement positive self-talk in care to increase self-esteem, confidence and feelings of control.  -consider alternative and complementary therapy approaches such as meditation, mindfulness or yoga.  -journaling, prayer, worship services, meditation or pastoral counseling.  -increase participation in pleasurable group activities such as hobbies, singing, sports or volunteering).  -consider the use of meditative movement therapy such as tai chi, yoga or qigong.  -start a regular daily exercise program based on tolerance, ability and patient choice to support positive thinking and activity    If you are experiencing a Mental Health or [place] or need  someone to talk to, please call the Suicide and Crisis Lifeline: 988    Patient Goals: Follow up goal

## 2022-10-10 NOTE — Patient Outreach (Signed)
Medicaid Managed Care Social Work Note  10/10/2022 Name:  Kaitlyn Crane MRN:  [id number] DOB:  [birth year]  Kaitlyn Crane is an 26 y.o. year old female who is a primary patient of [name], [place].  The Medicaid Managed Care Coordination team was consulted for assistance with:  [place] and Resources  Ms. Illescas was given information about Medicaid Managed Care Coordination team services today. Kaitlyn Crane Patient agreed to services and verbal consent obtained.  Engaged with patient  for by telephone forfollow up visit in response to referral for case management and/or care coordination services.   Assessments/Interventions:  Review of past medical history, allergies, medications, health status, including review of consultants reports, laboratory and other test data, was performed as part of comprehensive evaluation and provision of chronic care management services.  SDOH: (Social Determinant of Health) assessments and interventions performed: SDOH Interventions    Flowsheet Row Patient Outreach Telephone from 10/10/2022 in [place] Patient Outreach Telephone from 09/25/2022 in [place] [facility] from 05/29/2022 in [facility]  SDOH Interventions     Depression Interventions/Treatment  -- Counseling, Currently on Treatment Counseling  Stress Interventions Offered [employer], Provide Counseling Offered [employer], Provide Counseling --       Advanced Directives Status:  See Care Plan for related entries.  Care Plan                 Allergies  Allergen Reactions   Shellfish Allergy Other (See Comments)    Swelling of tonsils   Carbamide Peroxide Rash   Nickel Sulfate [Nickel] Rash    Medications Reviewed Today     Reviewed by Greg Cutter, LCSW (Social Worker) on 09/25/22 at 1009  Med List Status: <None>   Medication Order Taking? Sig  Documenting Provider Last Dose Status Informant  EPIPEN 2-PAK 0.3 MG/0.3ML SOAJ injection [id number]  Inject 0.3 mg into the muscle as needed for anaphylaxis. [name], NP  Active   lurasidone (LATUDA) 20 MG TABS tablet [id number] No Take by mouth. [provider] Taking Active   Multiple Vitamin (MULTIVITAMIN WITH MINERALS) TABS tablet [id number] No Take 1 tablet by mouth daily. [name], MD Taking Active   mupirocin ointment (BACTROBAN) 2 % [id number] No Apply 1 Application topically 2 (two) times daily as needed (extranal use only). [name], FNP Taking Active   nitrofurantoin (MACRODANTIN) 50 MG capsule [id number] No Take 50 mg by mouth daily. [provider] Taking Active   omeprazole (PRILOSEC OTC) 20 MG tablet [id number]  Take 20 mg by mouth 2 (two) times daily before a meal. [provider]  Active   VENTOLIN HFA 108 (90 Base) MCG/ACT inhaler [id number] No Inhale 2 puffs into the lungs every 6 (six) hours as needed for wheezing or shortness of breath. [name], MD Taking Active   vitamin B-12 1000 MCG tablet [id number] No Take 1 tablet (1,000 mcg total) by mouth daily. [name], MD Taking Active   Vitamin D, Ergocalciferol, (DRISDOL) 1.25 MG (50000 UNIT) CAPS capsule [id number] No Take 50,000 Units by mouth every 7 (seven) days. 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   Encounter for insertion of mirena IUD - inserted 12/12/12 12/12/2012   SVD (spontaneous vaginal delivery) 09/23/2012    Chorioamnionitis 09/23/2012   Late prenatal care 08/27/2012   Normal pregnancy, first 08/11/2012   Anemia 09/17/2011    Conditions to be addressed/monitored per PCP order:  Depression  Care Plan : LCSW Plan of Care  Updates made by Greg Cutter, LCSW since 10/10/2022 12:00 AM     Problem: Depression Identification (Depression)      Long-Range Goal: Depressive Symptoms Identified   Start Date: 09/25/2022  Note:    Priority: High  Timeframe:  Long-Range Goal Priority:  High Start Date:   09/25/22            Expected End Date:  ongoing                     Follow Up Date--10/24/22 at 230  - keep 90 percent of scheduled appointments -consider counseling or psychiatry -consider bumping up your self-care  -consider creating a stronger support network   Why is this important?             Combatting depression may take some time.            If you don't feel better right away, don't give up on your treatment plan.    Current barriers:   Chronic Mental Health needs related to bipolar, depression and stress. Patient requires Support, Education, Resources, Referrals, Advocacy, and Care Coordination, in order to meet Unmet Mental Health Needs. Patient will implement clinical interventions discussed today to decrease symptoms of depression and increase knowledge and/or ability of: coping skills. Mental Health Concerns and Social Isolation Patient lacks knowledge of available community counseling agencies and resources. Lack of a stable support network  Clinical Goal(s): verbalize understanding of plan for management of Bipolar, Depression, and Stress and demonstrate a reduction in symptoms. Patient will connect with a provider for ongoing mental health treatment, increase coping skills, healthy habits, self-management skills, and stress reduction        Clinical Interventions:  Assessed patient's previous and current treatment, coping skills, support system and barriers to care.  Patient provided hx  Verbalization of feelings encouraged, motivational interviewing employed Emotional support provided, positive coping strategies explored. Establishing healthy boundaries to use with family emphasized and healthy self-care education provided. Patient has had issues in the past with family interfering with her mental health care.  Patient was educated on available mental health resources within their area that accept Medicaid and offer counseling and psychiatry. Email sent to patient today with instructions for scheduling at [facility] as well as some crisis support resources and [facility]'s walk in clinic hours Patient reports that she wishes to change her mental health providers from [facility] to [facility]. Patient reports that her niece is a strong and stable support for her.  Patient is agreeable to referral to [facility] for counseling and psychiatry. [facility] LCSW made referral on 09/25/22. LCSW provided education on relaxation techniques such as meditation, deep breathing, massage, grounding exercises or yoga that can activate the body's relaxation response and ease symptoms of stress and anxiety. LCSW ask that when pt is struggling with difficult emotions and racing thoughts that they start this relaxation response process. LCSW provided extensive education on healthy coping skills for  anxiety. SW used active and reflective listening, validated patient's feelings/concerns, and provided emotional support. Patient will work on implementing appropriate self-care habits into their daily routine such as: staying positive, writing a gratitude list, drinking water, staying active around the house, taking their medications as prescribed, combating negative thoughts or emotions and staying connected with their family and friends. Positive reinforcement provided for this decision to work on this. Patient reports that they experience both anxiety and depression.She shares that he feels alone most days. She  was receptive to anxiety and depression management coping skill education. The following coping skill education was provided for stress relief and mental health management: "When your car dies or a deadline looms, how do you respond? Long-term, low-grade or acute stress takes a serious toll on your body and mind, so don't ignore feelings of constant tension. Stress is a natural part of life. However, too much stress can harm our health, especially if it continues every day. This is chronic stress and can put you at risk for heart problems like heart disease and depression. Understand what's happening inside your body and learn simple coping skills to combat the negative impacts of everyday stressors.  Types of Stress There are two types of stress: Emotional - types of emotional stress are relationship problems, pressure at work, financial worries, experiencing discrimination or having a major life change. Physical - Examples of physical stress include being sick having pain, not sleeping well, recovery from an injury or having an alcohol and drug use disorder. Fight or Flight Sudden or ongoing stress activates your nervous system and floods your bloodstream with adrenaline and cortisol, two hormones that raise blood pressure, increase heart rate and spike blood sugar. These changes pitch your body into a fight or flight response. That enabled our ancestors to outrun saber-toothed tigers, and it's helpful today for situations like dodging a car accident. But most modern chronic stressors, such as finances or a challenging relationship, keep your body in that heightened state, which hurts your health. Effects of Too Much Stress If constantly under stress, most of [place] will eventually start to function less well.  Multiple studies link chronic stress to a higher risk of heart disease, stroke, depression, weight gain, memory loss and even premature death, so it's important to recognize the warning  signals. Talk to your doctor about ways to manage stress if you're experiencing any of these symptoms: Prolonged periods of poor sleep. Regular, severe headaches. Unexplained weight loss or gain. Feelings of isolation, withdrawal or worthlessness. Constant anger and irritability. Loss of interest in activities. Constant worrying or obsessive thinking. Excessive alcohol or drug use. Inability to concentrate.  10 Ways to Cope with Chronic Stress It's key to recognize stressful situations as they occur because it allows you to focus on managing how you react. We all need to know when to close our eyes and take a deep breath when we feel tension rising. Use these tips to prevent or reduce chronic stress. 1. Rebalance Work and Home All work and no play? If you're spending too much time at the office, intentionally put more dates in your calendar to enjoy time for fun, either alone or with others. 2. Get Regular Exercise Moving your body on a regular basis balances the nervous system and increases blood circulation, helping to flush out stress hormones. Even a daily 20-minute walk makes a difference. Any kind of exercise can lower stress and improve your mood ? just pick activities that you enjoy and make it  a regular habit. 3. Eat Well and Limit Alcohol and Stimulants Alcohol, nicotine and caffeine may temporarily relieve stress but have negative health impacts and can make stress worse in the long run. Well-nourished bodies cope better, so start with a good breakfast, add more organic fruits and vegetables for a well-balanced diet, avoid processed foods and sugar, try herbal tea and drink more water. 4. Connect with Supportive People Talking face to face with another person releases hormones that reduce stress. Lean on those good listeners in your life. 5. [place] Time Do you enjoy gardening, reading, listening to music or some other creative pursuit? Engage in activities that bring you  pleasure and joy; research shows that reduces stress by almost half and lowers your heart rate, too. 6. Practice Meditation, Stress Reduction or Yoga Relaxation techniques activate a state of restfulness that counterbalances your body's fight-or-flight hormones. Even if this also means a 10-minute break in a long day: listen to music, read, go for a walk in nature, do a hobby, take a bath or spend time with a friend. Also consider doing a mindfulness exercise or try a daily deep breathing or imagery practice. Deep Breathing Slow, calm and deep breathing can help you relax. Try these steps to focus on your breathing and repeat as needed. Find a comfortable position and close your eyes. Exhale and drop your shoulders. Breathe in through your nose; fill your lungs and then your belly. Think of relaxing your body, quieting your mind and becoming calm and peaceful. Breathe out slowly through your nose, relaxing your belly. Think of releasing tension, pain, worries or distress. Repeat steps three and four until you feel relaxed. Imagery This involves using your mind to excite the senses -- sound, vision, smell, taste and feeling. This may help ease your stress. Begin by getting comfortable and then do some slow breathing. Imagine a place you love being at. It could be somewhere from your childhood, somewhere you vacationed or just a place in your imagination. Feel how it is to be in the place you're imagining. Pay attention to the sounds, air, colors, and who is there with you. This is a place where you feel cared for and loved. All is well. You are safe. Take in all the smells, sounds, tastes and feelings. As you do, feel your body being nourished and healed. Feel the calm that surrounds you. Breathe in all the good. Breathe out any discomfort or tension. 7. Sleep Enough If you get less than seven to eight hours of sleep, your body won't tolerate stress as well as it could. If stress keeps you up at  night, address the cause, and add extra meditation into your day to make up for the lost z's. Try to get seven to nine hours of sleep each night. Make a regular bedtime schedule. Keep your room dark and cool. Try to avoid computers, TV, cell phones and tablets before bed. 8. Bond with Connections You Enjoy Go out for a coffee with a friend, chat with a neighbor, call a family member, visit with a clergy member, or even hang out with your pet. Clinical studies show that spending even a short time with a companion animal can cut anxiety levels almost in half. 9. Take a Vacation Getting away from it all can reset your stress tolerance by increasing your mental and emotional outlook, which makes you a happier, more productive person upon return. Leave your cellphone and laptop at home! 10. See a Counselor,  Coach or Therapist If negative thoughts overwhelm your ability to make positive changes, it's time to seek professional help. Make an appointment today--your health and life are worth it."   Depression screen reviewed  PHQ2/ PHQ9 completed or reviewed  Mindfulness or Relaxation training provided Active listening / Reflection utilized  Advance Care and HCPOA education provided Emotional Support Provided Problem [place] strategies reviewed Provided psychoeducation for mental health needs  Provided brief CBT  Reviewed mental health medications and discussed importance of compliance:  Quality of sleep assessed & Sleep Hygiene techniques promoted  Participation in counseling encouraged  Verbalization of feelings encouraged  Suicidal Ideation/Homicidal Ideation assessed: Patient denies SI/HI  Review resources, discussed options and provided patient information about  [place] care team collaboration (see longitudinal plan of care) UPDATE- Patient reports that her PCP made a new referral to [place] and they contacted her but she is not sure if her  insurance is in their network and will be contacting her insurance provider to inquire. Patient reports that she has started group therapy at the [facility] and also an [place] at [employer]. She also signed up for a work training program at [employer] that will provide two weeks of hands on training before setting her up with new employment. Patient also has her initial intake appointment set up with [facility] that [place] LCSW referred her to but this appointment isn't until next month.  Patient Goals/Self-Care Activities: Over the next 120 days Attend scheduled medical appointments Utilize healthy coping skills and supportive resources discussed Contact PCP with any questions or concerns Keep 90 percent of counseling appointments Call your insurance provider for more information about your Enhanced Benefits  Check out counseling resources provided  Begin personal counseling with LCSW, to reduce and manage symptoms of Depression and Stress, until well-established with mental health provider Accept all calls from representative with [facility] in an effort to establish ongoing mental health counseling and supportive services. Incorporate into daily practice - relaxation techniques, deep breathing exercises, and mindfulness meditation strategies. Talk about feelings with friends, family members, spiritual advisor, etc. Contact LCSW directly [phone]), if you have questions, need assistance, or if additional social work needs are identified between now and our next scheduled telephone outreach call. Call 988 for mental health hotline/crisis line if needed (24/7 available) Try techniques to reduce symptoms of anxiety/negative thinking (deep breathing, distraction, positive self talk, etc)  - develop a personal safety plan - develop a plan to deal with triggers like holidays, anniversaries - exercise at least 2 to 3 times per week - have a plan for how to  handle bad days - journal feelings and what helps to feel better or worse - spend time or talk with others at least 2 to 3 times per week - watch for early signs of feeling worse - begin personal counseling - call and visit an old friend - check out volunteer opportunities - join a support group - laugh; watch a funny movie or comedian - learn and use visualization or guided imagery - perform a random act of kindness - practice relaxation or meditation daily - start or continue a personal journal - practice positive thinking and self-talk -continue with compliance of taking medication  -identify current effective and ineffective coping strategies.  -implement positive self-talk in care to increase self-esteem, confidence and feelings of control.  -consider alternative and complementary therapy approaches such as meditation, mindfulness or yoga.  -journaling, prayer, worship  services, meditation or pastoral counseling.  -increase participation in pleasurable group activities such as hobbies, singing, sports or volunteering).  -consider the use of meditative movement therapy such as tai chi, yoga or qigong.  -start a regular daily exercise program based on tolerance, ability and patient choice to support positive thinking and activity    If you are experiencing a Mental Health or [place] or need someone to talk to, please call the Suicide and Crisis Lifeline: 988    Patient Goals: Follow up goal      Follow up:  Patient agrees to Care Plan and Follow-up.  Plan: The Managed Medicaid care management team will reach out to the patient again over the next 30 days.  Date/time of next scheduled Social Work care management/care coordination outreach:  10/24/22 at [place], [place], MSW, [place] Medicaid LCSW [email or web address] Phone: [phone]

## 2022-10-22 ENCOUNTER — Ambulatory Visit (INDEPENDENT_AMBULATORY_CARE_PROVIDER_SITE_OTHER): Payer: Medicaid Other | Admitting: Clinical

## 2022-10-22 ENCOUNTER — Encounter (HOSPITAL_COMMUNITY): Payer: Self-pay

## 2022-10-22 DIAGNOSIS — F332 Major depressive disorder, recurrent severe without psychotic features: Secondary | ICD-10-CM | POA: Diagnosis not present
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
## 2022-10-24 ENCOUNTER — Ambulatory Visit (INDEPENDENT_AMBULATORY_CARE_PROVIDER_SITE_OTHER): Payer: Medicaid Other | Admitting: Allergy

## 2022-10-24 ENCOUNTER — Encounter (HOSPITAL_COMMUNITY): Payer: Self-pay | Admitting: Physician Assistant

## 2022-10-24 ENCOUNTER — Ambulatory Visit (INDEPENDENT_AMBULATORY_CARE_PROVIDER_SITE_OTHER): Payer: Medicaid Other | Admitting: Physician Assistant

## 2022-10-24 ENCOUNTER — Other Ambulatory Visit: Payer: Medicaid Other | Admitting: Licensed Clinical Social Worker

## 2022-10-24 ENCOUNTER — Encounter: Payer: Self-pay | Admitting: Allergy

## 2022-10-24 VITALS — BP 96/60 | HR 85 | Temp 98.0°F | Resp 16

## 2022-10-24 DIAGNOSIS — F411 Generalized anxiety disorder: Secondary | ICD-10-CM

## 2022-10-24 DIAGNOSIS — T781XXD Other adverse food reactions, not elsewhere classified, subsequent encounter: Secondary | ICD-10-CM | POA: Diagnosis not present

## 2022-10-24 DIAGNOSIS — H1013 Acute atopic conjunctivitis, bilateral: Secondary | ICD-10-CM

## 2022-10-24 DIAGNOSIS — L2389 Allergic contact dermatitis due to other agents: Secondary | ICD-10-CM

## 2022-10-24 DIAGNOSIS — J3089 Other allergic rhinitis: Secondary | ICD-10-CM | POA: Diagnosis not present

## 2022-10-24 DIAGNOSIS — J45990 Exercise induced bronchospasm: Secondary | ICD-10-CM

## 2022-10-24 DIAGNOSIS — F332 Major depressive disorder, recurrent severe without psychotic features: Secondary | ICD-10-CM | POA: Diagnosis not present

## 2022-10-24 DIAGNOSIS — G479 Sleep disorder, unspecified: Secondary | ICD-10-CM

## 2022-10-24 MED ORDER — TRIAMCINOLONE ACETONIDE 0.1 % EX OINT: TOPICAL_OINTMENT | CUTANEOUS | 3 refills | Status: DC

## 2022-10-24 MED ORDER — LEVOCETIRIZINE DIHYDROCHLORIDE 5 MG PO TABS: 5.0000 mg | ORAL_TABLET | Freq: Every day | ORAL | 5 refills | Status: DC | PRN

## 2022-10-24 MED ORDER — VENTOLIN HFA 108 (90 BASE) MCG/ACT IN AERS: 2.0000 | INHALATION_SPRAY | Freq: Four times a day (QID) | RESPIRATORY_TRACT | 1 refills | Status: DC | PRN

## 2022-10-24 MED ORDER — TRAZODONE HCL 50 MG PO TABS: 50.0000 mg | ORAL_TABLET | Freq: Every day | ORAL | 1 refills | Status: DC

## 2022-10-24 MED ORDER — CROMOLYN SODIUM 4 % OP SOLN: 1.0000 [drp] | Freq: Four times a day (QID) | OPHTHALMIC | 5 refills | Status: DC | PRN

## 2022-10-24 MED ORDER — FLUOXETINE HCL 10 MG PO CAPS: 10.0000 mg | ORAL_CAPSULE | Freq: Every day | ORAL | 1 refills | Status: DC

## 2022-10-24 MED ORDER — FLUTICASONE PROPIONATE 50 MCG/ACT NA SUSP: 2.0000 | Freq: Every day | NASAL | 5 refills | Status: DC | PRN
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
## 2022-10-24 NOTE — Progress Notes (Signed)
//   Follow-up Note  RE: Kaitlyn Crane MRN: [id number] DOB: 07/12/1996 Date of Office Visit: 10/24/2022   History of present illness: Kaitlyn Crane is a 27 y.o. female presenting today for follow-up of allergic rhinitis, contact dermatitis, adverse food reaction and asthma.  She was last seen in the office on 06/20/22 by myself.    She states her family may cook with shrimp paste and when she does eat these foods she notes her gums "flares" up.  She does try her best to avoid shellfish in diet.  She does have access to epipen device.    She states she does have the document we provided for the product safe list for her contact dermatitis and states she plans to use when she is buying new face products to ensure it is safe to use.  She does not have the triamcinolone to use if she does develop rash.  She is going to be seeing a dermatologist but states her appointment is about 8 months away.  She does states she has watery eyes often.  She currently does not have any eye drops to use.  She is not currently using any antihistamines or nasal sprays.  She does stay away from spicy foods due to acid reflux.  She does take omeprazole twice a day for reflux control.  She has an albuterol inhaler for her when she does do activities.  Review of systems in the past 4 weeks: Review of Systems  Constitutional: Negative.   HENT: Negative.    Eyes:        See HPI  Respiratory: Negative.    Cardiovascular: Negative.   Gastrointestinal: Negative.   Musculoskeletal: Negative.   Skin: Negative.   Allergic/Immunologic: Negative.   Neurological: Negative.      All other systems negative unless noted above in HPI  Past medical/social/surgical/family history have been reviewed and are unchanged unless specifically indicated below.  No changes  Medication List: Current Outpatient Medications  Medication Sig Dispense Refill   cromolyn (OPTICROM) 4 % ophthalmic solution Place 1 drop into both eyes 4 (four)  times daily as needed. 10 mL 5   EPIPEN 2-PAK 0.3 MG/0.3ML SOAJ injection Inject 0.3 mg into the muscle as needed for anaphylaxis. 1 each 1   fluticasone (FLONASE) 50 MCG/ACT nasal spray Place 2 sprays into both nostrils daily as needed for allergies or rhinitis. 16 mL 5   levocetirizine (XYZAL) 5 MG tablet Take 1 tablet (5 mg total) by mouth daily as needed. 30 tablet 5   lurasidone (LATUDA) 20 MG TABS tablet Take by mouth.     Multiple Vitamin (MULTIVITAMIN WITH MINERALS) TABS tablet Take 1 tablet by mouth daily.     omeprazole (PRILOSEC OTC) 20 MG tablet Take 20 mg by mouth 2 (two) times daily before a meal.     triamcinolone ointment (KENALOG) 0.1 % Apply sparingly to affected areas twice daily as needed below face and neck 60 g 3   vitamin B-12 1000 MCG tablet Take 1 tablet (1,000 mcg total) by mouth daily.     Vitamin D, Ergocalciferol, (DRISDOL) 1.25 MG (50000 UNIT) CAPS capsule Take 50,000 Units by mouth every 7 (seven) days.     FLUoxetine (PROZAC) 10 MG capsule Take 1 capsule (10 mg total) by mouth daily. 30 capsule 1   traZODone (DESYREL) 50 MG tablet Take 1 tablet (50 mg total) by mouth at bedtime. 30 tablet 1   VENTOLIN HFA 108 (90 Base) MCG/ACT inhaler Inhale 2 puffs  into the lungs every 6 (six) hours as needed for wheezing or shortness of breath. 18 g 1   No current facility-administered medications for this visit.     Known medication allergies: Allergies  Allergen Reactions   Shellfish Allergy Other (See Comments)    Swelling of tonsils   Carbamide Peroxide Rash   Nickel Sulfate [Nickel] Rash     Physical examination: Blood pressure 96/60, pulse 85, temperature 98 F (36.7 C), temperature source Temporal, resp. rate 16, SpO2 100 %, currently breastfeeding.  General: Alert, interactive, in no acute distress. HEENT: PERRLA, TMs pearly gray, turbinates non-edematous without discharge, post-pharynx non erythematous. Neck: Supple without lymphadenopathy. Lungs: Clear to  auscultation without wheezing, rhonchi or rales. {no increased work of breathing. CV: Normal S1, S2 without murmurs. Abdomen: Nondistended, nontender. Skin: Warm and dry, without lesions or rashes. Extremities:  No clubbing, cyanosis or edema. Neuro:   Grossly intact.  Diagnositics/Labs: None today  Assessment and plan: Rhinitis - Continue avoidance measures for dust mites. - For allergy symptoms take Xyzal 5mg  daily as needed - For nasal congestion/drainage can use Flonase 2 sprays each nostril daily for 1-2 weeks at a time before stopping once nasal congestion improves for maximum benefit - For itchy/watery eyes can use Cromolyn 1 drop each eye 3-4 times a day as needed   Contact dermatitis (contact allergy) - patch testing was positive to nickel and carba mix.  Do your best to avoid these products.  You have been provided with a product safe list from the contact dermatitis society to reference to your email (products on the list should not contain nickel or carba mix in their ingredients.  - if you develop rash (red, irritated, dry, itchy, patchy, scaly, flaky) only areas on the body then you can use Triamcinolone 0.1 % ointment twice a day as needed. 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  Negative to nuts.  - continue avoidance of shellfish as they can be cross-reactive - have access to self-injectable epinephrine (Epipen or AuviQ) 0.3mg  at all times - follow emergency action plan in case of allergic reaction  Exercise-induced asthma -have access to albuterol inhaler 2 puffs every 4-6 hours as needed for cough/wheeze/shortness of breath/chest tightness.  May use 15-20 minutes prior to activity.   Monitor frequency of use.     Routine follow-up in 6 months or sooner if needed  I appreciate the opportunity to take part in Kaitlyn Crane's care. Please do not hesitate to contact me with  questions.  Sincerely,   Prudy Feeler, MD Allergy/Immunology [facility]

## 2022-10-24 NOTE — Patient Instructions (Addendum)
Rhinitis - Continue avoidance measures for dust mites. - For allergy symptoms take Xyzal 5mg  daily as needed - For nasal congestion/drainage can use Flonase 2 sprays each nostril daily for 1-2 weeks at a time before stopping once nasal congestion improves for maximum benefit - For itchy/watery eyes can use Cromolyn 1 drop each eye 3-4 times a day as needed   Contact dermatitis (contact allergy) - patch testing was positive to nickel and carba mix.  Do your best to avoid these products.  You have been provided with a product safe list from the contact dermatitis society to reference to your email (products on the list should not contain nickel or carba mix in their ingredients.  - if you develop rash (red, irritated, dry, itchy, patchy, scaly, flaky) only areas on the body then you can use Triamcinolone 0.1 % ointment twice a day as needed. 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  Negative to nuts.  - continue avoidance of shellfish as they can be cross-reactive - have access to self-injectable epinephrine (Epipen or AuviQ) 0.3mg  at all times - follow emergency action plan in case of allergic reaction  Exercise-induced asthma -have access to albuterol inhaler 2 puffs every 4-6 hours as needed for cough/wheeze/shortness of breath/chest tightness.  May use 15-20 minutes prior to activity.   Monitor frequency of use.     Routine follow-up in 6 months or sooner if needed

## 2022-10-24 NOTE — Patient Outreach (Signed)
Medicaid Managed Care Social Work Note  10/24/2022 Name:  Kaitlyn Crane MRN:  [id number] DOB:  1996/09/24  Kaitlyn Crane is an 27 y.o. year old female who is a primary patient of Olive Bass, FNP.  The Medicaid Managed Care Coordination team was consulted for assistance with:  Mental Health Counseling and Resources  Ms. Soulier was given information about Medicaid Managed Care Coordination team services today. Dineen Duchemin Patient agreed to services and verbal consent obtained.  Engaged with patient  for by telephone forfollow up visit in response to referral for case management and/or care coordination services.   Assessments/Interventions:  Review of past medical history, allergies, medications, health status, including review of consultants reports, laboratory and other test data, was performed as part of comprehensive evaluation and provision of chronic care management services.  SDOH: (Social Determinant of Health) assessments and interventions performed: SDOH Interventions    Flowsheet Row Office Visit from 10/24/2022 in [facility] Counselor from 10/22/2022 in [facility] Patient Outreach Telephone from 10/10/2022 in [place] POPULATION HEALTH DEPARTMENT Patient Outreach Telephone from 09/25/2022 in [place] POPULATION HEALTH DEPARTMENT Nutrition from 05/29/2022 in [facility]  SDOH Interventions       Depression Interventions/Treatment  Medication, Counseling Counseling, Medication, Referral to Psychiatry -- Counseling, Currently on Treatment Counseling  Stress Interventions -- -- [employer], Provide Counseling Offered [employer], Provide Counseling --       Advanced Directives Status:  See Care Plan for related entries.  Care Plan                 Allergies  Allergen Reactions   Shellfish Allergy Other (See Comments)    Swelling of tonsils    Carbamide Peroxide Rash   Nickel Sulfate [Nickel] Rash    Medications Reviewed Today     Reviewed by Marcelyn Bruins, MD (Physician) on 10/24/22 at 1519  Med List Status: <None>   Medication Order Taking? Sig Documenting Provider Last Dose Status Informant  cromolyn (OPTICROM) 4 % ophthalmic solution [id number] Yes Place 1 drop into both eyes 4 (four) times daily as needed. Marcelyn Bruins, MD  Active   EPIPEN 2-PAK 0.3 MG/0.3ML SOAJ injection [id number] Yes Inject 0.3 mg into the muscle as needed for anaphylaxis. Clayborne Dana, NP Taking Active   FLUoxetine (PROZAC) 10 MG capsule [id number]  Take 1 capsule (10 mg total) by mouth daily. Meta Hatchet, PA  Active   fluticasone (FLONASE) 50 MCG/ACT nasal spray [id number] Yes Place 2 sprays into both nostrils daily as needed for allergies or rhinitis. Marcelyn Bruins, MD  Active   levocetirizine Elita Boone) 5 MG tablet [id number] Yes Take 1 tablet (5 mg total) by mouth daily as needed. Marcelyn Bruins, MD  Active   lurasidone (LATUDA) 20 MG TABS tablet [id number] Yes Take by mouth. [provider] Taking Active   Multiple Vitamin (MULTIVITAMIN WITH MINERALS) TABS tablet [id number] Yes Take 1 tablet by mouth daily. Karsten Ro, MD Taking Active   omeprazole (PRILOSEC OTC) 20 MG tablet [id number] Yes Take 20 mg by mouth 2 (two) times daily before a meal. [provider] Taking Active   traZODone (DESYREL) 50 MG tablet [id number]  Take 1 tablet (50 mg total) by mouth at bedtime. Meta Hatchet, PA  Active   triamcinolone ointment (KENALOG) 0.1 % [id number] Yes Apply sparingly to affected areas twice daily as needed below face and neck Delorse Lek, Pilar Grammes,  MD  Active   VENTOLIN HFA 108 (90 Base) MCG/ACT inhaler [id number]  Inhale 2 puffs into the lungs every 6 (six) hours as needed for wheezing or shortness of breath. Marcelyn Bruins, MD  Active   vitamin B-12 1000 MCG tablet [id number]  Yes Take 1 tablet (1,000 mcg total) by mouth daily. Karsten Ro, MD Taking Active   Vitamin D, Ergocalciferol, (DRISDOL) 1.25 MG (50000 UNIT) CAPS capsule [id number] Yes Take 50,000 Units by mouth every 7 (seven) days. 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   Encounter for insertion of mirena IUD - inserted 12/12/12 12/12/2012   SVD (spontaneous vaginal delivery) 09/23/2012   Chorioamnionitis 09/23/2012   Late prenatal care 08/27/2012   Normal pregnancy, first 08/11/2012   Anemia 09/17/2011    Conditions to be addressed/monitored per PCP order:  Depression  Care Plan : LCSW Plan of Care  Updates made by Gustavus Bryant, LCSW since 10/24/2022 12:00 AM     Problem: Depression Identification (Depression)      Patient Care Plan: LCSW Plan of Care     Problem Identified: Depression Identification (Depression)      Long-Range Goal: Depressive Symptoms Identified   Start Date: 09/25/2022  Note:    Priority: High  Timeframe:  Long-Range Goal Priority:  High Start Date:   09/25/22            Expected End Date:  ongoing                     Follow Up Date--11/07/22 at 1 pm  - keep 90 percent of scheduled appointments -consider counseling or psychiatry -consider bumping up your self-care  -consider creating a stronger support network   Why is this important?             Combatting depression may take some time.            If you don't feel better right away, don't give up on  your treatment plan.    Current barriers:   Chronic Mental Health needs related to bipolar, depression and stress. Patient requires Support, Education, Resources, Referrals, Advocacy, and Care Coordination, in order to meet Unmet Mental Health Needs. Patient will implement clinical interventions discussed today to decrease symptoms of depression and increase knowledge and/or ability of: coping skills. Mental Health Concerns and Social Isolation Patient lacks knowledge of available community counseling agencies and resources. Lack of a stable support network  Clinical Goal(s): verbalize understanding of plan for management of Bipolar, Depression, and Stress and demonstrate a reduction in symptoms. Patient will connect with a provider for ongoing mental health treatment, increase coping skills, healthy habits, self-management skills, and stress reduction        Clinical Interventions:  Assessed patient's previous and current treatment, coping skills, support system and barriers to care. Patient provided hx  Verbalization of feelings encouraged, motivational interviewing employed Emotional support provided, positive coping strategies explored. Establishing healthy boundaries to  use with family emphasized and healthy self-care education provided. Patient has had issues in the past with family interfering with her mental health care.  Patient was educated on available mental health resources within their area that accept Medicaid and offer counseling and psychiatry. Email sent to patient today with instructions for scheduling at [facility] as well as some crisis support resources and [facility]'s walk in clinic hours Patient reports that she wishes to change her mental health providers from [facility] to [facility]. Patient reports that her niece is a strong and stable support for her.  Patient is agreeable to referral to [facility] for counseling and psychiatry. [facility] LCSW made referral on 09/25/22. LCSW provided  education on relaxation techniques such as meditation, deep breathing, massage, grounding exercises or yoga that can activate the body's relaxation response and ease symptoms of stress and anxiety. LCSW ask that when pt is struggling with difficult emotions and racing thoughts that they start this relaxation response process. LCSW provided extensive education on healthy coping skills for anxiety. SW used active and reflective listening, validated patient's feelings/concerns, and provided emotional support. Patient will work on implementing appropriate self-care habits into their daily routine such as: staying positive, writing a gratitude list, drinking water, staying active around the house, taking their medications as prescribed, combating negative thoughts or emotions and staying connected with their family and friends. Positive reinforcement provided for this decision to work on this. 10/25/22 UPDATE- [facility] LCSW talked to patient while she was in the waiting room at [facility] to see a psychiatrist. She already has completed her initial counseling session there and reports feeling comfortable with her current mental health care team. [facility] LCSW will follow up later this month. Patient reports that they experience both anxiety and depression.She shares that he feels alone most days. She was receptive to anxiety and depression management coping skill education. The following coping skill education was provided for stress relief and mental health management: "When your car dies or a deadline looms, how do you respond? Long-term, low-grade or acute stress takes a serious toll on your body and mind, so don't ignore feelings of constant tension. Stress is a natural part of life. However, too much stress can harm our health, especially if it continues every day. This is chronic stress and can put you at risk for heart problems like heart disease and depression. Understand what's happening inside your body and learn simple  coping skills to combat the negative impacts of everyday stressors.  Types of Stress There are two types of stress: Emotional - types of emotional stress are relationship problems, pressure at work, financial worries, experiencing discrimination or having a major life change. Physical - Examples of physical stress include being sick having pain, not sleeping well, recovery from an injury or having an alcohol and drug use disorder. Fight or Flight Sudden or ongoing stress activates your nervous system and floods your bloodstream with adrenaline and cortisol, two hormones that raise blood pressure, increase heart rate and spike blood sugar. These changes pitch your body into a fight or flight response. That enabled our ancestors to outrun saber-toothed tigers, and it's helpful today for situations like dodging a car accident. But most modern chronic stressors, such as finances or a challenging relationship, keep your body in that heightened state, which hurts your health. Effects of Too Much Stress If constantly under stress, most of [place] will eventually start to function less well.  Multiple studies link chronic stress to a higher risk of heart disease,  stroke, depression, weight gain, memory loss and even premature death, so it's important to recognize the warning signals. Talk to your doctor about ways to manage stress if you're experiencing any of these symptoms: Prolonged periods of poor sleep. Regular, severe headaches. Unexplained weight loss or gain. Feelings of isolation, withdrawal or worthlessness. Constant anger and irritability. Loss of interest in activities. Constant worrying or obsessive thinking. Excessive alcohol or drug use. Inability to concentrate.  10 Ways to Cope with Chronic Stress It's key to recognize stressful situations as they occur because it allows you to focus on managing how you react. We all need to know when to close our eyes and take a deep breath when we feel  tension rising. Use these tips to prevent or reduce chronic stress. 1. Rebalance Work and Home All work and no play? If you're spending too much time at the office, intentionally put more dates in your calendar to enjoy time for fun, either alone or with others. 2. Get Regular Exercise Moving your body on a regular basis balances the nervous system and increases blood circulation, helping to flush out stress hormones. Even a daily 20-minute walk makes a difference. Any kind of exercise can lower stress and improve your mood ? just pick activities that you enjoy and make it a regular habit. 3. Eat Well and Limit Alcohol and Stimulants Alcohol, nicotine and caffeine may temporarily relieve stress but have negative health impacts and can make stress worse in the long run. Well-nourished bodies cope better, so start with a good breakfast, add more organic fruits and vegetables for a well-balanced diet, avoid processed foods and sugar, try herbal tea and drink more water. 4. Connect with Supportive People Talking face to face with another person releases hormones that reduce stress. Lean on those good listeners in your life. 5. Carve Out Hobby Time Do you enjoy gardening, reading, listening to music or some other creative pursuit? Engage in activities that bring you pleasure and joy; research shows that reduces stress by almost half and lowers your heart rate, too. 6. Practice Meditation, Stress Reduction or Yoga Relaxation techniques activate a state of restfulness that counterbalances your body's fight-or-flight hormones. Even if this also means a 10-minute break in a long day: listen to music, read, go for a walk in nature, do a hobby, take a bath or spend time with a friend. Also consider doing a mindfulness exercise or try a daily deep breathing or imagery practice. Deep Breathing Slow, calm and deep breathing can help you relax. Try these steps to focus on your breathing and repeat as needed. Find a  comfortable position and close your eyes. Exhale and drop your shoulders. Breathe in through your nose; fill your lungs and then your belly. Think of relaxing your body, quieting your mind and becoming calm and peaceful. Breathe out slowly through your nose, relaxing your belly. Think of releasing tension, pain, worries or distress. Repeat steps three and four until you feel relaxed. Imagery This involves using your mind to excite the senses -- sound, vision, smell, taste and feeling. This may help ease your stress. Begin by getting comfortable and then do some slow breathing. Imagine a place you love being at. It could be somewhere from your childhood, somewhere you vacationed or just a place in your imagination. Feel how it is to be in the place you're imagining. Pay attention to the sounds, air, colors, and who is there with you. This is a place where you feel cared  for and loved. All is well. You are safe. Take in all the smells, sounds, tastes and feelings. As you do, feel your body being nourished and healed. Feel the calm that surrounds you. Breathe in all the good. Breathe out any discomfort or tension. 7. Sleep Enough If you get less than seven to eight hours of sleep, your body won't tolerate stress as well as it could. If stress keeps you up at night, address the cause, and add extra meditation into your day to make up for the lost z's. Try to get seven to nine hours of sleep each night. Make a regular bedtime schedule. Keep your room dark and cool. Try to avoid computers, TV, cell phones and tablets before bed. 8. Bond with Connections You Enjoy Go out for a coffee with a friend, chat with a neighbor, call a family member, visit with a clergy member, or even hang out with your pet. Clinical studies show that spending even a short time with a companion animal can cut anxiety levels almost in half. 9. Take a Vacation Getting away from it all can reset your stress tolerance by increasing  your mental and emotional outlook, which makes you a happier, more productive person upon return. Leave your cellphone and laptop at home! 10. See a Counselor, Coach or Therapist If negative thoughts overwhelm your ability to make positive changes, it's time to seek professional help. Make an appointment today--your health and life are worth it."   Depression screen reviewed  PHQ2/ PHQ9 completed or reviewed  Mindfulness or Relaxation training provided Active listening / Reflection utilized  Advance Care and HCPOA education provided Emotional Support Provided Problem Solving /Task Center strategies reviewed Provided psychoeducation for mental health needs  Provided brief CBT  Reviewed mental health medications and discussed importance of compliance:  Quality of sleep assessed & Sleep Hygiene techniques promoted  Participation in counseling encouraged  Verbalization of feelings encouraged  Suicidal Ideation/Homicidal Ideation assessed: Patient denies SI/HI  Review resources, discussed options and provided patient information about  Mental Health Resources Inter-disciplinary care team collaboration (see longitudinal plan of care) UPDATE- Patient reports that her PCP made a new referral to [facility] and they contacted her but she is not sure if her insurance is in their network and will be contacting her insurance provider to inquire. Patient reports that she has started group therapy at the [facility] and also an Emotional Wellness Group at [employer]. She also signed up for a work training program at [employer] that will provide two weeks of hands on training before setting her up with new employment. Patient also has her initial intake appointment set up with [facility] that [facility] LCSW referred her to but this appointment isn't until next month.  Patient Goals/Self-Care Activities: Over the next 120 days Attend scheduled medical appointments Utilize healthy coping skills  and supportive resources discussed Contact PCP with any questions or concerns Keep 90 percent of counseling appointments Call your insurance provider for more information about your Enhanced Benefits  Check out counseling resources provided  Begin personal counseling with LCSW, to reduce and manage symptoms of Depression and Stress, until well-established with mental health provider Accept all calls from representative with [facility] in an effort to establish ongoing mental health counseling and supportive services. Incorporate into daily practice - relaxation techniques, deep breathing exercises, and mindfulness meditation strategies. Talk about feelings with friends, family members, spiritual advisor, etc. Contact LCSW directly [phone]), if you have questions, need assistance, or  if additional social work needs are identified between now and our next scheduled telephone outreach call. Call [phone] for mental health hotline/crisis line if needed (24/7 available) Try techniques to reduce symptoms of anxiety/negative thinking (deep breathing, distraction, positive self talk, etc)  - develop a personal safety plan - develop a plan to deal with triggers like holidays, anniversaries - exercise at least 2 to 3 times per week - have a plan for how to handle bad days - journal feelings and what helps to feel better or worse - spend time or talk with others at least 2 to 3 times per week - watch for early signs of feeling worse - begin personal counseling - call and visit an old friend - check out volunteer opportunities - join a support group - laugh; watch a funny movie or comedian - learn and use visualization or guided imagery - perform a random act of kindness - practice relaxation or meditation daily - start or continue a personal journal - practice positive thinking and self-talk -continue with compliance of taking medication  -identify current effective and ineffective coping strategies.   -implement positive self-talk in care to increase self-esteem, confidence and feelings of control.  -consider alternative and complementary therapy approaches such as meditation, mindfulness or yoga.  -journaling, prayer, worship services, meditation or pastoral counseling.  -increase participation in pleasurable group activities such as hobbies, singing, sports or volunteering).  -consider the use of meditative movement therapy such as tai chi, yoga or qigong.  -start a regular daily exercise program based on tolerance, ability and patient choice to support positive thinking and activity    If you are experiencing a Mental Health or [place] or need someone to talk to, please call the Suicide and Crisis Lifeline: 988    Patient Goals: Follow up goal       Follow up:  Patient agrees to Care Plan and Follow-up.  Plan: The Managed Medicaid care management team will reach out to the patient again over the next 30 days.  Date/time of next scheduled Social Work care management/care coordination outreach:  11/07/22 at [place], [place], MSW, [place] Medicaid LCSW [email or web address] Phone: [phone]

## 2022-10-25 NOTE — Patient Instructions (Signed)
Visit Information  Ms. Balandran was given information about Medicaid Managed Care team care coordination services as a part of their [facility] Community Plan Medicaid benefit. Ura Mcginnis verbally consented to engagement with the [facility] Managed Care team.   If you are experiencing a medical emergency, please call 911 or report to your local emergency department or urgent care.   If you have a non-emergency medical problem during routine business hours, please contact your provider's office and ask to speak with a nurse.   For questions related to your [facility], please call: [phone] or visit the homepage here: [email or web address]  If you would like to schedule transportation through your [facility], please call the following number at least 2 days in advance of your appointment: [phone]   Rides for urgent appointments can also be made after hours by calling Member Services.  Call the Behavioral Health Crisis Line at [phone], at any time, 24 hours a day, 7 days a week. If you are in danger or need immediate medical attention call 911.  If you would like help to quit smoking, call 1-800-QUIT-NOW (620-506-9023) OR Espaol: 1-855-Djelo-Ya (3-500-938-1829) o para ms informacin haga clic aqu or Text READY to 937-169 to register via text  Following is a copy of your plan of care:  Care Plan : LCSW Plan of Care  Updates made by Gustavus Bryant, LCSW since 10/25/2022 12:00 AM     Problem: Depression Identification (Depression)      Long-Range Goal: Depressive Symptoms Identified   Start Date: 09/25/2022  Note:    Priority: High  Timeframe:  Long-Range Goal Priority:  High Start Date:   09/25/22            Expected End Date:  ongoing                     Follow Up Date--11/07/22 at 1 pm  - keep 90 percent of scheduled appointments -consider counseling or  psychiatry -consider bumping up your self-care  -consider creating a stronger support network   Why is this important?             Combatting depression may take some time.            If you don't feel better right away, don't give up on your treatment plan.    Current barriers:   Chronic Mental Health needs related to bipolar, depression and stress. Patient requires Support, Education, Resources, Referrals, Advocacy, and Care Coordination, in order to meet Unmet Mental Health Needs. Patient will implement clinical interventions discussed today to decrease symptoms of depression and increase knowledge and/or ability of: coping skills. Mental Health Concerns and Social Isolation Patient lacks knowledge of available community counseling agencies and resources. Lack of a stable support network  Clinical Goal(s): verbalize understanding of plan for management of Bipolar, Depression, and Stress and demonstrate a reduction in symptoms. Patient will connect with a provider for ongoing mental health treatment, increase coping skills, healthy habits, self-management skills, and stress reduction        Effects of Too Much Stress If constantly under stress, most of [place] will eventually start to function less well.  Multiple studies link chronic stress to a higher risk of heart disease, stroke, depression, weight gain, memory loss and even premature death, so it's important to recognize the warning signals. Talk to your doctor about ways to manage stress if you're experiencing any of these symptoms: Prolonged periods  of poor sleep. Regular, severe headaches. Unexplained weight loss or gain. Feelings of isolation, withdrawal or worthlessness. Constant anger and irritability. Loss of interest in activities. Constant worrying or obsessive thinking. Excessive alcohol or drug use. Inability to concentrate.  10 Ways to Cope with Chronic Stress It's key to recognize stressful situations as they occur  because it allows you to focus on managing how you react. We all need to know when to close our eyes and take a deep breath when we feel tension rising. Use these tips to prevent or reduce chronic stress. 1. Rebalance Work and Home All work and no play? If you're spending too much time at the office, intentionally put more dates in your calendar to enjoy time for fun, either alone or with others. 2. Get Regular Exercise Moving your body on a regular basis balances the nervous system and increases blood circulation, helping to flush out stress hormones. Even a daily 20-minute walk makes a difference. Any kind of exercise can lower stress and improve your mood ? just pick activities that you enjoy and make it a regular habit. 3. Eat Well and Limit Alcohol and Stimulants Alcohol, nicotine and caffeine may temporarily relieve stress but have negative health impacts and can make stress worse in the long run. Well-nourished bodies cope better, so start with a good breakfast, add more organic fruits and vegetables for a well-balanced diet, avoid processed foods and sugar, try herbal tea and drink more water. 4. Connect with Supportive People Talking face to face with another person releases hormones that reduce stress. Lean on those good listeners in your life. 5. [place] Time Do you enjoy gardening, reading, listening to music or some other creative pursuit? Engage in activities that bring you pleasure and joy; research shows that reduces stress by almost half and lowers your heart rate, too. 6. Practice Meditation, Stress Reduction or Yoga Relaxation techniques activate a state of restfulness that counterbalances your body's fight-or-flight hormones. Even if this also means a 10-minute break in a long day: listen to music, read, go for a walk in nature, do a hobby, take a bath or spend time with a friend. Also consider doing a mindfulness exercise or try a daily deep breathing or imagery  practice. Deep Breathing Slow, calm and deep breathing can help you relax. Try these steps to focus on your breathing and repeat as needed. Find a comfortable position and close your eyes. Exhale and drop your shoulders. Breathe in through your nose; fill your lungs and then your belly. Think of relaxing your body, quieting your mind and becoming calm and peaceful. Breathe out slowly through your nose, relaxing your belly. Think of releasing tension, pain, worries or distress. Repeat steps three and four until you feel relaxed. Imagery This involves using your mind to excite the senses -- sound, vision, smell, taste and feeling. This may help ease your stress. Begin by getting comfortable and then do some slow breathing. Imagine a place you love being at. It could be somewhere from your childhood, somewhere you vacationed or just a place in your imagination. Feel how it is to be in the place you're imagining. Pay attention to the sounds, air, colors, and who is there with you. This is a place where you feel cared for and loved. All is well. You are safe. Take in all the smells, sounds, tastes and feelings. As you do, feel your body being nourished and healed. Feel the calm that surrounds you. Breathe in  all the good. Breathe out any discomfort or tension. 7. Sleep Enough If you get less than seven to eight hours of sleep, your body won't tolerate stress as well as it could. If stress keeps you up at night, address the cause, and add extra meditation into your day to make up for the lost z's. Try to get seven to nine hours of sleep each night. Make a regular bedtime schedule. Keep your room dark and cool. Try to avoid computers, TV, cell phones and tablets before bed. 8. Bond with Connections You Enjoy Go out for a coffee with a friend, chat with a neighbor, call a family member, visit with a clergy member, or even hang out with your pet. Clinical studies show that spending even a short time with a  companion animal can cut anxiety levels almost in half. 9. Take a Vacation Getting away from it all can reset your stress tolerance by increasing your mental and emotional outlook, which makes you a happier, more productive person upon return. Leave your cellphone and laptop at home! 10. See a Counselor, Coach or Therapist If negative thoughts overwhelm your ability to make positive changes, it's time to seek professional help. Make an appointment today--your health and life are worth it."   Depression screen reviewed  PHQ2/ PHQ9 completed or reviewed  Mindfulness or Relaxation training provided Active listening / Reflection utilized  Advance Care and HCPOA education provided Emotional Support Provided Problem [place] strategies reviewed Provided psychoeducation for mental health needs  Provided brief CBT  Reviewed mental health medications and discussed importance of compliance:  Quality of sleep assessed & Sleep Hygiene techniques promoted  Participation in counseling encouraged  Verbalization of feelings encouraged  Suicidal Ideation/Homicidal Ideation assessed: Patient denies SI/HI  Review resources, discussed options and provided patient information about  [place] care team collaboration (see longitudinal plan of care) UPDATE- Patient reports that her PCP made a new referral to [facility] and they contacted her but she is not sure if her insurance is in their network and will be contacting her insurance provider to inquire. Patient reports that she has started group therapy at the [facility] and also an [place] at [employer]. She also signed up for a work training program at [employer] that will provide two weeks of hands on training before setting her up with new employment. Patient also has her initial intake appointment set up with [facility] that [facility] LCSW referred her to but this appointment isn't  until next month.  Patient Goals/Self-Care Activities: Over the next 120 days Attend scheduled medical appointments Utilize healthy coping skills and supportive resources discussed Contact PCP with any questions or concerns Keep 90 percent of counseling appointments Call your insurance provider for more information about your Enhanced Benefits  Check out counseling resources provided  Begin personal counseling with LCSW, to reduce and manage symptoms of Depression and Stress, until well-established with mental health provider Accept all calls from representative with [facility] in an effort to establish ongoing mental health counseling and supportive services. Incorporate into daily practice - relaxation techniques, deep breathing exercises, and mindfulness meditation strategies. Talk about feelings with friends, family members, spiritual advisor, etc. Contact LCSW directly [phone]), if you have questions, need assistance, or if additional social work needs are identified between now and our next scheduled telephone outreach call. Call 988 for mental health hotline/crisis line if needed (24/7 available) Try techniques to reduce symptoms of anxiety/negative thinking (deep  breathing, distraction, positive self talk, etc)  - develop a personal safety plan - develop a plan to deal with triggers like holidays, anniversaries - exercise at least 2 to 3 times per week - have a plan for how to handle bad days - journal feelings and what helps to feel better or worse - spend time or talk with others at least 2 to 3 times per week - watch for early signs of feeling worse - begin personal counseling - call and visit an old friend - check out volunteer opportunities - join a support group - laugh; watch a funny movie or comedian - learn and use visualization or guided imagery - perform a random act of kindness - practice relaxation or meditation daily - start or continue a personal journal -  practice positive thinking and self-talk -continue with compliance of taking medication  -identify current effective and ineffective coping strategies.  -implement positive self-talk in care to increase self-esteem, confidence and feelings of control.  -consider alternative and complementary therapy approaches such as meditation, mindfulness or yoga.  -journaling, prayer, worship services, meditation or pastoral counseling.  -increase participation in pleasurable group activities such as hobbies, singing, sports or volunteering).  -consider the use of meditative movement therapy such as tai chi, yoga or qigong.  -start a regular daily exercise program based on tolerance, ability and patient choice to support positive thinking and activity    If you are experiencing a Mental Health or [place] or need someone to talk to, please call the Suicide and Crisis Lifeline: 988    Patient Goals: Follow up goal

## 2022-10-29 DIAGNOSIS — F3132 Bipolar disorder, current episode depressed, moderate: Secondary | ICD-10-CM | POA: Diagnosis not present

## 2022-10-30 ENCOUNTER — Ambulatory Visit: Payer: Medicaid Other | Admitting: Registered"

## 2022-11-07 ENCOUNTER — Other Ambulatory Visit: Payer: Medicaid Other | Admitting: Licensed Clinical Social Worker

## 2022-11-07 NOTE — Patient Outreach (Signed)
  Medicaid Managed Care   Unsuccessful Attempt Note   11/07/2022 Name: Kaitlyn Crane MRN: [id number] DOB: 07-May-1996  Referred by: [name], [place] Reason for referral : No chief complaint on file.   An unsuccessful telephone outreach was attempted today. The patient was referred to the case management team for assistance with care management and care coordination.    Follow Up Plan: A HIPAA compliant phone message was left for the patient providing contact information and requesting a return call.   Eula Fried, BSW, MSW, [employer] Managed Medicaid LCSW [email or web address] Phone: [phone]

## 2022-11-13 ENCOUNTER — Ambulatory Visit: Payer: Medicaid Other | Admitting: Registered"

## 2022-11-13 DIAGNOSIS — F3132 Bipolar disorder, current episode depressed, moderate: Secondary | ICD-10-CM | POA: Diagnosis not present

## 2022-11-16 ENCOUNTER — Telehealth (HOSPITAL_COMMUNITY): Payer: Self-pay | Admitting: Clinical

## 2022-11-16 ENCOUNTER — Ambulatory Visit (HOSPITAL_COMMUNITY): Payer: Medicaid Other | Admitting: Clinical

## 2022-11-16 ENCOUNTER — Encounter (HOSPITAL_COMMUNITY): Payer: Self-pay

## 2022-11-16 NOTE — Telephone Encounter (Signed)
Therapist sent the client a two links to cell number on file for the scheduled virtual therapy appointment. Client did not check in using the link. Therapist called the client via telephone. Client answered and reported she did not receive a link. Therapist confirmed phone number it was sent to. Client reported she is unable to keep the appointment and will wait until the next scheduled appointment.

## 2022-11-19 ENCOUNTER — Telehealth: Payer: Self-pay

## 2022-11-19 NOTE — Telephone Encounter (Signed)
..   Medicaid Managed Care   Unsuccessful Outreach Note  11/19/2022 Name: Kaitlyn Crane MRN: [id number] DOB: [birth year]  Referred by: [name], [place] Reason for referral : Appointment (I called the patient today to get her rescheduled with the [facility] LCSW. Someone answered and then hung up on me. I was not able to get them to answer again.)   A second unsuccessful telephone outreach was attempted today. The patient was referred to the case management team for assistance with care management and care coordination.   Follow Up Plan: The care management team will reach out to the patient again over the next 7 days.    [place]

## 2022-11-29 ENCOUNTER — Encounter (HOSPITAL_COMMUNITY): Payer: Medicaid Other | Admitting: Physician Assistant

## 2022-11-30 ENCOUNTER — Other Ambulatory Visit: Payer: Medicaid Other | Admitting: Licensed Clinical Social Worker

## 2022-11-30 ENCOUNTER — Ambulatory Visit (INDEPENDENT_AMBULATORY_CARE_PROVIDER_SITE_OTHER): Payer: Medicaid Other | Admitting: Clinical

## 2022-11-30 DIAGNOSIS — F332 Major depressive disorder, recurrent severe without psychotic features: Secondary | ICD-10-CM

## 2022-11-30 NOTE — Patient Instructions (Signed)
Visit Information  Ms. Alyea was given information about Medicaid Managed Care team care coordination services as a part of their [place] Medicaid benefit. Fayette Barrows verbally consented to engagement with the [facility] Managed Care team.   If you are experiencing a medical emergency, please call 911 or report to your local emergency department or urgent care.   If you have a non-emergency medical problem during routine business hours, please contact your provider's office and ask to speak with a nurse.   For questions related to your [facility], please call: [phone] or visit the homepage here: [email or web address]  If you would like to schedule transportation through your [facility], please call the following number at least 2 days in advance of your appointment: [phone]   Rides for urgent appointments can also be made after hours by calling Member Services.  Call the [place] at [phone], at any time, 24 hours a day, 7 days a week. If you are in danger or need immediate medical attention call 911.  If you would like help to quit smoking, call 1-800-QUIT-NOW (724)687-8441) OR Espaol: 1-855-Djelo-Ya QO:409462) o para ms informacin haga clic aqu or Text READY to 200-400 to register via text

## 2022-11-30 NOTE — Patient Outreach (Signed)
Medicaid Managed Care Social Work Note  11/30/2022 Name:  Kaitlyn Crane MRN:  [id number] DOB:  1996/09/13  Kaitlyn Crane is an 27 y.o. year old female who is a primary patient of [name], [place].  The Medicaid Managed Care Coordination team was consulted for assistance with:  [place] and Resources  Ms. Stieg was given information about Medicaid Managed Care Coordination team services today. [name] Patient agreed to services and verbal consent obtained.  Engaged with patient  for by telephone forfollow up visit in response to referral for case management and/or care coordination services.   Assessments/Interventions:  Review of past medical history, allergies, medications, health status, including review of consultants reports, laboratory and other test data, was performed as part of comprehensive evaluation and provision of chronic care management services.  SDOH: (Social Determinant of Health) assessments and interventions performed: SDOH Interventions    Flowsheet Row Patient Outreach Telephone from 11/30/2022 in [place] Office Visit from 10/24/2022 in [facility] Counselor from 10/22/2022 in [facility] Patient Outreach Telephone from 10/10/2022 in [place] Patient Outreach Telephone from 09/25/2022 in [place] Nutrition from 05/29/2022 in [place] at [place]  SDOH Interventions        Depression Interventions/Treatment  -- Medication, Counseling Counseling, Medication, Referral to Psychiatry -- Counseling, Currently on Treatment Counseling  Stress Interventions [place], Provide Counseling --       Advanced Directives Status:   See Care Plan for related entries.  Care Plan                 Allergies  Allergen Reactions   Shellfish Allergy Other (See Comments)    Swelling of tonsils   Carbamide Peroxide Rash   Nickel Sulfate [Nickel] Rash    Medications Reviewed Today     Reviewed by [name], PA (Physician Assistant) on 10/24/22 at [place] List Status: <None>   Medication Order Taking? Sig Documenting Provider Last Dose Status Informant  cromolyn (OPTICROM) 4 % ophthalmic solution [id number]  Place 1 drop into both eyes 4 (four) times daily as needed. [name], MD  Active   EPIPEN 2-PAK 0.3 MG/0.3ML SOAJ injection [id number] No Inject 0.3 mg into the muscle as needed for anaphylaxis. [name], NP Taking Active   FLUoxetine (PROZAC) 10 MG capsule [id number] Yes Take 1 capsule (10 mg total) by mouth daily. [name], PA  Active   fluticasone (FLONASE) 50 MCG/ACT nasal spray [id number]  Place 2 sprays into both nostrils daily as needed for allergies or rhinitis. [name], MD  Active   levocetirizine (XYZAL) 5 MG tablet [id number]  Take 1 tablet (5 mg total) by mouth daily as needed. [name], MD  Active   lurasidone (LATUDA) 20 MG TABS tablet [id number] No Take by mouth. [provider] Taking Active   Multiple Vitamin (MULTIVITAMIN WITH MINERALS) TABS tablet [id number] No Take 1 tablet by mouth daily. [name], MD Taking Active   omeprazole (PRILOSEC OTC) 20 MG tablet [id number] No Take 20 mg by mouth 2 (two) times daily before a meal. [provider] Taking Active   traZODone (DESYREL) 50 MG tablet [id number] Yes Take 1 tablet (50 mg total) by mouth at bedtime. [name], PA  Active  triamcinolone ointment (KENALOG) 0.1 % [id number]  Apply sparingly to affected areas twice daily as needed below face and neck [name], [name], MD  Active   VENTOLIN HFA 108 (90 Base) MCG/ACT inhaler [id number]  Inhale 2 puffs into  the lungs every 6 (six) hours as needed for wheezing or shortness of breath. [name], MD  Active   vitamin B-12 1000 MCG tablet [id number] No Take 1 tablet (1,000 mcg total) by mouth daily. [name], MD Taking Active   Vitamin D, Ergocalciferol, (DRISDOL) 1.25 MG (50000 UNIT) CAPS capsule [id number] No Take 50,000 Units by mouth every 7 (seven) days. 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   Encounter for insertion of mirena IUD - inserted 12/12/12 12/12/2012   SVD (spontaneous vaginal delivery) 09/23/2012   Chorioamnionitis 09/23/2012   Late prenatal care 08/27/2012   Normal pregnancy, first 08/11/2012   Anemia 09/17/2011    Conditions to be addressed/monitored per PCP order:  Anxiety, Depression, and Bipolar Disorder  Care Plan : LCSW Plan of Care  Updates made by Greg Cutter, LCSW since 11/30/2022 12:00 AM     Problem: Depression Identification (Depression)      Long-Range Goal: Depressive Symptoms Identified   Start Date: 09/25/2022  Note:    Priority: High  Timeframe:  Long-Range Goal Priority:  High Start Date:   09/25/22            Expected End Date:  ongoing                     Follow Up Date--12/11/22 at 1245   - keep 90 percent of scheduled appointments -consider counseling or psychiatry -consider bumping up your self-care  -consider creating a stronger support network   Why is this important?             Combatting depression may take  some time.            If you don't feel better right away, don't give up on your treatment plan.    Current barriers:   Chronic Mental Health needs related to bipolar, depression and stress. Patient requires Support, Education, Resources, Referrals, Advocacy, and Care Coordination, in order to meet Unmet Mental Health Needs. Patient will implement clinical interventions discussed today to decrease symptoms of depression and increase knowledge and/or ability of: coping skills. Mental Health Concerns and Social Isolation Patient lacks knowledge of available community counseling agencies and resources. Lack of a stable support network  Clinical Goal(s): verbalize understanding of plan for management of Bipolar, Depression, and Stress and demonstrate a reduction in symptoms. Patient will connect with a provider for ongoing mental health treatment, increase coping skills, healthy habits, self-management skills, and stress reduction        Clinical Interventions:  Assessed patient's previous and current treatment, coping skills, support system and barriers to care. Patient provided hx  Verbalization of feelings encouraged, motivational interviewing employed Emotional support provided, positive coping  strategies explored. Establishing healthy boundaries to use with family emphasized and healthy self-care education provided. Patient has had issues in the past with family interfering with her mental health care.  Patient was educated on available mental health resources within their area that accept Medicaid and offer counseling and psychiatry. Email sent to patient today with instructions for scheduling at [facility] as well as some crisis support resources and [facility]'s walk in clinic hours Patient reports that she wishes to change her mental health providers from [facility] to [facility]. Patient reports that her niece is a strong and stable support for her.  Patient is agreeable to referral to [facility] for  counseling and psychiatry. [facility] LCSW made referral on 09/25/22. LCSW provided education on relaxation techniques such as meditation, deep breathing, massage, grounding exercises or yoga that can activate the body's relaxation response and ease symptoms of stress and anxiety. LCSW ask that when pt is struggling with difficult emotions and racing thoughts that they start this relaxation response process. LCSW provided extensive education on healthy coping skills for anxiety. SW used active and reflective listening, validated patient's feelings/concerns, and provided emotional support. Patient will work on implementing appropriate self-care habits into their daily routine such as: staying positive, writing a gratitude list, drinking water, staying active around the house, taking their medications as prescribed, combating negative thoughts or emotions and staying connected with their family and friends. Positive reinforcement provided for this decision to work on this. 10/25/22 UPDATE- [facility] LCSW talked to patient while she was in the waiting room at [facility] to see a psychiatrist. She already has completed her initial counseling session there and reports feeling comfortable with her current mental health care team. [facility] LCSW will follow up later this month. 11/30/22 UPDATE- Patient reports that she wishes to continue psychiatry services at [facility] and continue counseling at [facility] with counselor Arby Barrette. She reports that she is on a new medication that she is experiencing crying spells as a side effect. Patient was advised to contact her psychiatrist before her appointment next week to update her provider on this negative side effects. Crisis resource education provided.  Patient reports that they experience both anxiety and depression.She shares that he feels alone most days. She was receptive to anxiety and depression management coping skill education. The following coping skill education was provided for stress relief  and mental health management: "When your car dies or a deadline looms, how do you respond? Long-term, low-grade or acute stress takes a serious toll on your body and mind, so don't ignore feelings of constant tension. Stress is a natural part of life. However, too much stress can harm our health, especially if it continues every day. This is chronic stress and can put you at risk for heart problems like heart disease and depression. Understand what's happening inside your body and learn simple coping skills to combat the negative impacts of everyday stressors.  Types of Stress There are two types of stress: Emotional - types of emotional stress are relationship problems, pressure at work, financial worries, experiencing discrimination or having a major life change. Physical - Examples of physical stress include being sick having pain, not sleeping well, recovery from an injury or having an alcohol and drug use disorder. Fight or Flight Sudden or ongoing stress activates your nervous system and floods your bloodstream with adrenaline and cortisol, two hormones that raise blood pressure, increase heart rate and spike blood sugar. These changes pitch your body into a fight or flight response. That  enabled our ancestors to outrun saber-toothed tigers, and it's helpful today for situations like dodging a car accident. But most modern chronic stressors, such as finances or a challenging relationship, keep your body in that heightened state, which hurts your health. Effects of Too Much Stress If constantly under stress, most of [place] will eventually start to function less well.  Multiple studies link chronic stress to a higher risk of heart disease, stroke, depression, weight gain, memory loss and even premature death, so it's important to recognize the warning signals. Talk to your doctor about ways to manage stress if you're experiencing any of these symptoms: Prolonged periods of poor sleep. Regular, severe  headaches. Unexplained weight loss or gain. Feelings of isolation, withdrawal or worthlessness. Constant anger and irritability. Loss of interest in activities. Constant worrying or obsessive thinking. Excessive alcohol or drug use. Inability to concentrate.  10 Ways to Cope with Chronic Stress It's key to recognize stressful situations as they occur because it allows you to focus on managing how you react. We all need to know when to close our eyes and take a deep breath when we feel tension rising. Use these tips to prevent or reduce chronic stress. 1. Rebalance Work and Home All work and no play? If you're spending too much time at the office, intentionally put more dates in your calendar to enjoy time for fun, either alone or with others. 2. Get Regular Exercise Moving your body on a regular basis balances the nervous system and increases blood circulation, helping to flush out stress hormones. Even a daily 20-minute walk makes a difference. Any kind of exercise can lower stress and improve your mood ? just pick activities that you enjoy and make it a regular habit. 3. Eat Well and Limit Alcohol and Stimulants Alcohol, nicotine and caffeine may temporarily relieve stress but have negative health impacts and can make stress worse in the long run. Well-nourished bodies cope better, so start with a good breakfast, add more organic fruits and vegetables for a well-balanced diet, avoid processed foods and sugar, try herbal tea and drink more water. 4. Connect with Supportive People Talking face to face with another person releases hormones that reduce stress. Lean on those good listeners in your life. 5. [place] Time Do you enjoy gardening, reading, listening to music or some other creative pursuit? Engage in activities that bring you pleasure and joy; research shows that reduces stress by almost half and lowers your heart rate, too. 6. Practice Meditation, Stress Reduction or  Yoga Relaxation techniques activate a state of restfulness that counterbalances your body's fight-or-flight hormones. Even if this also means a 10-minute break in a long day: listen to music, read, go for a walk in nature, do a hobby, take a bath or spend time with a friend. Also consider doing a mindfulness exercise or try a daily deep breathing or imagery practice. Deep Breathing Slow, calm and deep breathing can help you relax. Try these steps to focus on your breathing and repeat as needed. Find a comfortable position and close your eyes. Exhale and drop your shoulders. Breathe in through your nose; fill your lungs and then your belly. Think of relaxing your body, quieting your mind and becoming calm and peaceful. Breathe out slowly through your nose, relaxing your belly. Think of releasing tension, pain, worries or distress. Repeat steps three and four until you feel relaxed. Imagery This involves using your mind to excite the senses -- sound, vision, smell, taste and  feeling. This may help ease your stress. Begin by getting comfortable and then do some slow breathing. Imagine a place you love being at. It could be somewhere from your childhood, somewhere you vacationed or just a place in your imagination. Feel how it is to be in the place you're imagining. Pay attention to the sounds, air, colors, and who is there with you. This is a place where you feel cared for and loved. All is well. You are safe. Take in all the smells, sounds, tastes and feelings. As you do, feel your body being nourished and healed. Feel the calm that surrounds you. Breathe in all the good. Breathe out any discomfort or tension. 7. Sleep Enough If you get less than seven to eight hours of sleep, your body won't tolerate stress as well as it could. If stress keeps you up at night, address the cause, and add extra meditation into your day to make up for the lost z's. Try to get seven to nine hours of sleep each night.  Make a regular bedtime schedule. Keep your room dark and cool. Try to avoid computers, TV, cell phones and tablets before bed. 8. Bond with Connections You Enjoy Go out for a coffee with a friend, chat with a neighbor, call a family member, visit with a clergy member, or even hang out with your pet. Clinical studies show that spending even a short time with a companion animal can cut anxiety levels almost in half. 9. Take a Vacation Getting away from it all can reset your stress tolerance by increasing your mental and emotional outlook, which makes you a happier, more productive person upon return. Leave your cellphone and laptop at home! 10. See a Counselor, Coach or Therapist If negative thoughts overwhelm your ability to make positive changes, it's time to seek professional help. Make an appointment today--your health and life are worth it."   Depression screen reviewed  PHQ2/ PHQ9 completed or reviewed  Mindfulness or Relaxation training provided Active listening / Reflection utilized  Advance Care and HCPOA education provided Emotional Support Provided Problem [place] strategies reviewed Provided psychoeducation for mental health needs  Provided brief CBT  Reviewed mental health medications and discussed importance of compliance:  Quality of sleep assessed & Sleep Hygiene techniques promoted  Participation in counseling encouraged  Verbalization of feelings encouraged  Suicidal Ideation/Homicidal Ideation assessed: Patient denies SI/HI  Review resources, discussed options and provided patient information about  [place] care team collaboration (see longitudinal plan of care) Past update- Patient reports that her PCP made a new referral to [place] and they contacted her but she is not sure if her insurance is in their network and will be contacting her insurance provider to inquire. Patient reports that she has started group therapy at the  [facility] and also an [place] at [employer]. She also signed up for a work training program at [employer] that will provide two weeks of hands on training before setting her up with new employment. Patient also has her initial intake appointment set up with [facility] that [facility] LCSW referred her to but this appointment isn't until next month.  Patient Goals/Self-Care Activities: Over the next 120 days Attend scheduled medical appointments Utilize healthy coping skills and supportive resources discussed Contact PCP with any questions or concerns Keep 90 percent of counseling appointments Call your insurance provider for more information about your Enhanced Benefits  Check out counseling resources provided  Begin personal counseling with LCSW, to reduce and manage symptoms of Depression and Stress, until well-established with mental health provider Accept all calls from representative with [facility] in an effort to establish ongoing mental health counseling and supportive services. Incorporate into daily practice - relaxation techniques, deep breathing exercises, and mindfulness meditation strategies. Talk about feelings with friends, family members, spiritual advisor, etc. Contact LCSW directly [phone]), if you have questions, need assistance, or if additional social work needs are identified between now and our next scheduled telephone outreach call. Call 988 for mental health hotline/crisis line if needed (24/7 available) Try techniques to reduce symptoms of anxiety/negative thinking (deep breathing, distraction, positive self talk, etc)  - develop a personal safety plan - develop a plan to deal with triggers like holidays, anniversaries - exercise at least 2 to 3 times per week - have a plan for how to handle bad days - journal feelings and what helps to feel better or worse - spend time or talk with others at least 2 to 3 times per week - watch  for early signs of feeling worse - begin personal counseling - call and visit an old friend - check out volunteer opportunities - join a support group - laugh; watch a funny movie or comedian - learn and use visualization or guided imagery - perform a random act of kindness - practice relaxation or meditation daily - start or continue a personal journal - practice positive thinking and self-talk -continue with compliance of taking medication  -identify current effective and ineffective coping strategies.  -implement positive self-talk in care to increase self-esteem, confidence and feelings of control.  -consider alternative and complementary therapy approaches such as meditation, mindfulness or yoga.  -journaling, prayer, worship services, meditation or pastoral counseling.  -increase participation in pleasurable group activities such as hobbies, singing, sports or volunteering).  -consider the use of meditative movement therapy such as tai chi, yoga or qigong.  -start a regular daily exercise program based on tolerance, ability and patient choice to support positive thinking and activity    If you are experiencing a Mental Health or [place] or need someone to talk to, please call the Suicide and Crisis Lifeline: 988    Patient Goals: Follow up goal      Follow up:  Patient agrees to Care Plan and Follow-up.  Plan: The Managed Medicaid care management team will reach out to the patient again over the next 30 days.  Date/time of next scheduled Social Work care management/care coordination outreach:  12/11/22 at [place], [place], MSW, [place] Medicaid LCSW [email or web address] Phone: [phone]

## 2022-12-01 NOTE — Progress Notes (Signed)
THERAPIST PROGRESS NOTE Virtual Visit via Video Note  I connected with Kaitlyn Crane on 11/30/2022 at 11:00 AM EST by a video enabled telemedicine application and verified that I am speaking with the correct person using two identifiers.  Location: Patient: home Provider: office   I discussed the limitations of evaluation and management by telemedicine and the availability of in person appointments. The patient expressed understanding and agreed to proceed.   Follow Up Instructions: I discussed the assessment and treatment plan with the patient. The patient was provided an opportunity to ask questions and all were answered. The patient agreed with the plan and demonstrated an understanding of the instructions.   The patient was advised to call back or seek an in-person evaluation if the symptoms worsen or if the condition fails to improve as anticipated.   Session Time: 50 minutes  Participation Level: Active  Behavioral Response: CasualAlertDepressed  Type of Therapy: Individual Therapy  Treatment Goals addressed: client will practice behavioral activation skills 3 times per week for the next 12 weeks  ProgressTowards Goals: Progressing  Interventions: CBT and Supportive  Summary:  Kaitlyn Crane is a 27 y.o. female who presents for the scheduled appointment oriented times five, appropriately dressed, and friendly. Client denied hallucinations and delusions. Client reported she is doing okay but having some difficulty with making decision about her mental health providers. Client reported she followed up with [facility] psychiatrist and although the visit went well she was not in total agreement with diagnosis. Client reported she went to her previous psychiatry office and started a different medication but is not pleased with the care she received from them at [facility]. Client reported she doe snot want to have another episode like her hospitalization.  Client reported she has been going  to group therapy and receiving peer support from [facility]. Client reported she has been having symptoms of crying and being forgetful. Client reported she has been working on organizing her space to help her feel better and get back in touch with activities she enjoys. Client reported it is a ongoing challenge that her family does not respect her. Client reported they often use her but do not reciprocate during her time of need for emotional support or other stressors. Client reported she is coming into accepting her family is not going to change. Client reported she struggles with keeping herself on track with a routine. Client reported after 2 days she falls off and feels bad about herself. Evidence of progress towards goal:  client reported 2 skills of brainstorming a daily routine and utilizing hobbies at least 2 days out of 7.   Suicidal/Homicidal: Nowithout intent/plan  Therapist Response:  Therapist began the appointment asking the client how she has been doing since last seen. Therapist used CBT to engage using active listening and positive emotional support. Therapist used CBT to engage and ask the client about medication compliance and concerns regarding that. Therapist used CBT to ask the client about psychosocial stressors contributing to her symptoms. Therapist used CBT to discuss routine, self care, boundaries, and making informed decision about her mental health care. Therapist used CBT ask the client to identify her progress with frequency of use with coping skills with continued practice in her daily activity.    Therapist assigned the client homework to decide about her psychiatry provider, maintain short terms goals for creating a new routine and practice self care.    Plan: Return again in 3 weeks.  Diagnosis: severe episode of  recurrent major depressive disorder, without psychotic features  Collaboration of Care: Patient refused AEB none requested by the  client.  Patient/Guardian was advised Release of Information must be obtained prior to any record release in order to collaborate their care with an outside provider. Patient/Guardian was advised if they have not already done so to contact the registration department to sign all necessary forms in order for [place] to release information regarding their care.   Consent: Patient/Guardian gives verbal consent for treatment and assignment of benefits for services provided during this visit. Patient/Guardian expressed understanding and agreed to proceed.   [place], LCSW 12/01/2022

## 2022-12-07 ENCOUNTER — Ambulatory Visit: Payer: Medicaid Other | Admitting: Licensed Clinical Social Worker

## 2022-12-11 ENCOUNTER — Other Ambulatory Visit: Payer: Medicaid Other | Admitting: Licensed Clinical Social Worker

## 2022-12-11 NOTE — Patient Instructions (Signed)
Visit Information  Ms. Anastasia was given information about Medicaid Managed Care team care coordination services as a part of their [place] Medicaid benefit. Javiana Maduro verbally consented to engagement with the [facility] Managed Care team.   If you are experiencing a medical emergency, please call 911 or report to your local emergency department or urgent care.   If you have a non-emergency medical problem during routine business hours, please contact your provider's office and ask to speak with a nurse.   For questions related to your [facility], please call: [phone] or visit the homepage here: [email or web address]  If you would like to schedule transportation through your [facility], please call the following number at least 2 days in advance of your appointment: [phone]   Rides for urgent appointments can also be made after hours by calling Member Services.  Call the [place] at [phone], at any time, 24 hours a day, 7 days a week. If you are in danger or need immediate medical attention call 911.  If you would like help to quit smoking, call 1-800-QUIT-NOW 270-412-2940) OR Espaol: 1-855-Djelo-Ya HD:1601594) o para ms informacin haga clic aqu or Text READY to 200-400 to register via text   Following is a copy of your plan of care:  Care Plan : LCSW Plan of Care  Updates made by Greg Cutter, LCSW since 12/11/2022 12:00 AM     Problem: Depression Identification (Depression)      Long-Range Goal: Depressive Symptoms Identified   Start Date: 09/25/2022  Note:    Priority: High  Timeframe:  Long-Range Goal Priority:  High Start Date:   09/25/22            Expected End Date:  ongoing                     Follow Up Date--12/26/22 at 245 pm  - keep 90 percent of scheduled appointments -consider counseling or  psychiatry -consider bumping up your self-care  -consider creating a stronger support network   Why is this important?             Combatting depression may take some time.            If you don't feel better right away, don't give up on your treatment plan.    Current barriers:   Chronic Mental Health needs related to bipolar, depression and stress. Patient requires Support, Education, Resources, Referrals, Advocacy, and Care Coordination, in order to meet Unmet Mental Health Needs. Patient will implement clinical interventions discussed today to decrease symptoms of depression and increase knowledge and/or ability of: coping skills. Mental Health Concerns and Social Isolation Patient lacks knowledge of available community counseling agencies and resources. Lack of a stable support network  Clinical Goal(s): verbalize understanding of plan for management of Bipolar, Depression, and Stress and demonstrate a reduction in symptoms. Patient will connect with a provider for ongoing mental health treatment, increase coping skills, healthy habits, self-management skills, and stress reduction        Types of Stress There are two types of stress: Emotional - types of emotional stress are relationship problems, pressure at work, financial worries, experiencing discrimination or having a major life change. Physical - Examples of physical stress include being sick having pain, not sleeping well, recovery from an injury or having an alcohol and drug use disorder. Fight or Flight Sudden or ongoing stress activates your nervous  system and floods your bloodstream with adrenaline and cortisol, two hormones that raise blood pressure, increase heart rate and spike blood sugar. These changes pitch your body into a fight or flight response. That enabled our ancestors to outrun saber-toothed tigers, and it's helpful today for situations like dodging a car accident. But most modern chronic stressors, such as  finances or a challenging relationship, keep your body in that heightened state, which hurts your health. Effects of Too Much Stress If constantly under stress, most of [place] will eventually start to function less well.  Multiple studies link chronic stress to a higher risk of heart disease, stroke, depression, weight gain, memory loss and even premature death, so it's important to recognize the warning signals. Talk to your doctor about ways to manage stress if you're experiencing any of these symptoms: Prolonged periods of poor sleep. Regular, severe headaches. Unexplained weight loss or gain. Feelings of isolation, withdrawal or worthlessness. Constant anger and irritability. Loss of interest in activities. Constant worrying or obsessive thinking. Excessive alcohol or drug use. Inability to concentrate.  10 Ways to Cope with Chronic Stress It's key to recognize stressful situations as they occur because it allows you to focus on managing how you react. We all need to know when to close our eyes and take a deep breath when we feel tension rising. Use these tips to prevent or reduce chronic stress. 1. Rebalance Work and Home All work and no play? If you're spending too much time at the office, intentionally put more dates in your calendar to enjoy time for fun, either alone or with others. 2. Get Regular Exercise Moving your body on a regular basis balances the nervous system and increases blood circulation, helping to flush out stress hormones. Even a daily 20-minute walk makes a difference. Any kind of exercise can lower stress and improve your mood ? just pick activities that you enjoy and make it a regular habit. 3. Eat Well and Limit Alcohol and Stimulants Alcohol, nicotine and caffeine may temporarily relieve stress but have negative health impacts and can make stress worse in the long run. Well-nourished bodies cope better, so start with a good breakfast, add more organic fruits and  vegetables for a well-balanced diet, avoid processed foods and sugar, try herbal tea and drink more water. 4. Connect with Supportive People Talking face to face with another person releases hormones that reduce stress. Lean on those good listeners in your life. 5. [place] Time Do you enjoy gardening, reading, listening to music or some other creative pursuit? Engage in activities that bring you pleasure and joy; research shows that reduces stress by almost half and lowers your heart rate, too. 6. Practice Meditation, Stress Reduction or Yoga Relaxation techniques activate a state of restfulness that counterbalances your body's fight-or-flight hormones. Even if this also means a 10-minute break in a long day: listen to music, read, go for a walk in nature, do a hobby, take a bath or spend time with a friend. Also consider doing a mindfulness exercise or try a daily deep breathing or imagery practice. Deep Breathing Slow, calm and deep breathing can help you relax. Try these steps to focus on your breathing and repeat as needed. Find a comfortable position and close your eyes. Exhale and drop your shoulders. Breathe in through your nose; fill your lungs and then your belly. Think of relaxing your body, quieting your mind and becoming calm and peaceful. Breathe out slowly through your nose, relaxing your  belly. Think of releasing tension, pain, worries or distress. Repeat steps three and four until you feel relaxed. Imagery This involves using your mind to excite the senses -- sound, vision, smell, taste and feeling. This may help ease your stress. Begin by getting comfortable and then do some slow breathing. Imagine a place you love being at. It could be somewhere from your childhood, somewhere you vacationed or just a place in your imagination. Feel how it is to be in the place you're imagining. Pay attention to the sounds, air, colors, and who is there with you. This is a place where you  feel cared for and loved. All is well. You are safe. Take in all the smells, sounds, tastes and feelings. As you do, feel your body being nourished and healed. Feel the calm that surrounds you. Breathe in all the good. Breathe out any discomfort or tension. 7. Sleep Enough If you get less than seven to eight hours of sleep, your body won't tolerate stress as well as it could. If stress keeps you up at night, address the cause, and add extra meditation into your day to make up for the lost z's. Try to get seven to nine hours of sleep each night. Make a regular bedtime schedule. Keep your room dark and cool. Try to avoid computers, TV, cell phones and tablets before bed. 8. Bond with Connections You Enjoy Go out for a coffee with a friend, chat with a neighbor, call a family member, visit with a clergy member, or even hang out with your pet. Clinical studies show that spending even a short time with a companion animal can cut anxiety levels almost in half. 9. Take a Vacation Getting away from it all can reset your stress tolerance by increasing your mental and emotional outlook, which makes you a happier, more productive person upon return. Leave your cellphone and laptop at home! 10. See a Counselor, Coach or Therapist If negative thoughts overwhelm your ability to make positive changes, it's time to seek professional help. Make an appointment today--your health and life are worth it."   Depression screen reviewed  PHQ2/ PHQ9 completed or reviewed  Mindfulness or Relaxation training provided Active listening / Reflection utilized  Advance Care and HCPOA education provided Emotional Support Provided Problem [place] strategies reviewed Provided psychoeducation for mental health needs  Provided brief CBT  Reviewed mental health medications and discussed importance of compliance:  Quality of sleep assessed & Sleep Hygiene techniques promoted  Participation in counseling encouraged   Verbalization of feelings encouraged  Suicidal Ideation/Homicidal Ideation assessed: Patient denies SI/HI  Review resources, discussed options and provided patient information about  [place] care team collaboration (see longitudinal plan of care)  Patient Goals/Self-Care Activities: Over the next 120 days Attend scheduled medical appointments Utilize healthy coping skills and supportive resources discussed Contact PCP with any questions or concerns Keep 90 percent of counseling appointments Call your insurance provider for more information about your Enhanced Benefits  Check out counseling resources provided  Begin personal counseling with LCSW, to reduce and manage symptoms of Depression and Stress, until well-established with mental health provider Accept all calls from representative with [facility] in an effort to establish ongoing mental health counseling and supportive services. Incorporate into daily practice - relaxation techniques, deep breathing exercises, and mindfulness meditation strategies. Talk about feelings with friends, family members, spiritual advisor, etc. Contact LCSW directly [phone]), if you have questions, need assistance, or if additional social work needs  are identified between now and our next scheduled telephone outreach call. Call 988 for mental health hotline/crisis line if needed (24/7 available) Try techniques to reduce symptoms of anxiety/negative thinking (deep breathing, distraction, positive self talk, etc)  - develop a personal safety plan - develop a plan to deal with triggers like holidays, anniversaries - exercise at least 2 to 3 times per week - have a plan for how to handle bad days - journal feelings and what helps to feel better or worse - spend time or talk with others at least 2 to 3 times per week - watch for early signs of feeling worse - begin personal counseling - call and visit an old friend -  check out volunteer opportunities - join a support group - laugh; watch a funny movie or comedian - learn and use visualization or guided imagery - perform a random act of kindness - practice relaxation or meditation daily - start or continue a personal journal - practice positive thinking and self-talk -continue with compliance of taking medication  -identify current effective and ineffective coping strategies.  -implement positive self-talk in care to increase self-esteem, confidence and feelings of control.  -consider alternative and complementary therapy approaches such as meditation, mindfulness or yoga.  -journaling, prayer, worship services, meditation or pastoral counseling.  -increase participation in pleasurable group activities such as hobbies, singing, sports or volunteering).  -consider the use of meditative movement therapy such as tai chi, yoga or qigong.  -start a regular daily exercise program based on tolerance, ability and patient choice to support positive thinking and activity    If you are experiencing a Mental Health or [place] or need someone to talk to, please call the Suicide and Crisis Lifeline: 988    Patient Goals: Follow up goal

## 2022-12-11 NOTE — Patient Outreach (Signed)
Medicaid Managed Care Social Work Note  12/11/2022 Name:  Kaitlyn Crane MRN:  [id number] DOB:  13-Jun-1996  Kaitlyn Crane is an 27 y.o. year old female who is a primary patient of Kaitlyn Crane, [place].  The Medicaid Managed Care Coordination team was consulted for assistance with:  [place] and Resources  Ms. Bardwell was given information about Medicaid Managed Care Coordination team services today. Lashelle Troiani Patient agreed to services and verbal consent obtained.  Engaged with patient  for by telephone forfollow up visit in response to referral for case management and/or care coordination services.   Assessments/Interventions:  Review of past medical history, allergies, medications, health status, including review of consultants reports, laboratory and other test data, was performed as part of comprehensive evaluation and provision of chronic care management services.  SDOH: (Social Determinant of Health) assessments and interventions performed: SDOH Interventions    Flowsheet Row Patient Outreach Telephone from 12/11/2022 in [place] Patient Outreach Telephone from 11/30/2022 in [place] Office Visit from 10/24/2022 in [facility] Counselor from 10/22/2022 in [facility] Patient Outreach Telephone from 10/10/2022 in [place] Patient Outreach Telephone from 09/25/2022 in [place]  SDOH Interventions        Depression Interventions/Treatment  -- -- Medication, Counseling Counseling, Medication, Referral to Psychiatry -- Counseling, Currently on Treatment  Stress Interventions Offered [employer], Provide Counseling Offered [employer] Resources, Provide Counseling -- -- [place], Provide Counseling        Advanced Directives Status:  See Care Plan for related entries.  Care Plan                 Allergies  Allergen Reactions   Shellfish Allergy Other (See Comments)    Swelling of tonsils   Carbamide Peroxide Rash   Nickel Sulfate [Nickel] Rash    Medications Reviewed Today     Reviewed by [name], PA (Physician Assistant) on 10/24/22 at [place] List Status: <None>   Medication Order Taking? Sig Documenting Provider Last Dose Status Informant  cromolyn (OPTICROM) 4 % ophthalmic solution [id number]  Place 1 drop into both eyes 4 (four) times daily as needed. [name], MD  Active   EPIPEN 2-PAK 0.3 MG/0.3ML SOAJ injection [id number] No Inject 0.3 mg into the muscle as needed for anaphylaxis. [name], NP Taking Active   FLUoxetine (PROZAC) 10 MG capsule [id number] Yes Take 1 capsule (10 mg total) by mouth daily. [name], PA  Active   fluticasone (FLONASE) 50 MCG/ACT nasal spray [id number]  Place 2 sprays into both nostrils daily as needed for allergies or rhinitis. [name], MD  Active   levocetirizine (XYZAL) 5 MG tablet [id number]  Take 1 tablet (5 mg total) by mouth daily as needed. [name], MD  Active   lurasidone (LATUDA) 20 MG TABS tablet [id number] No Take by mouth. [provider] Taking Active   Multiple Vitamin (MULTIVITAMIN WITH MINERALS) TABS tablet [id number] No Take 1 tablet by mouth daily. [name], MD Taking Active   omeprazole (PRILOSEC OTC) 20 MG tablet [id number] No Take 20 mg by mouth 2 (two) times daily before a meal. [provider] Taking Active   traZODone (DESYREL) 50 MG tablet [id number] Yes Take 1 tablet (50 mg total) by mouth at bedtime. [name], PA  Active   triamcinolone ointment (KENALOG) 0.1 % [id number]  Apply sparingly to affected areas twice daily as needed below face and neck [name], [name], MD  Active   VENTOLIN HFA 108 (90 Base) MCG/ACT  inhaler [id number]  Inhale 2 puffs into the lungs every 6 (six) hours as needed for wheezing or shortness of breath. [name], MD  Active   vitamin B-12 1000 MCG tablet [id number] No Take 1 tablet (1,000 mcg total) by mouth daily. [name], MD Taking Active   Vitamin D, Ergocalciferol, (DRISDOL) 1.25 MG (50000 UNIT) CAPS capsule [id number] No Take 50,000 Units by mouth every 7 (seven) days. 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   Encounter for insertion of mirena IUD - inserted 12/12/12 12/12/2012   SVD (spontaneous vaginal delivery) 09/23/2012   Chorioamnionitis 09/23/2012   Late prenatal care 08/27/2012   Normal pregnancy, first 08/11/2012   Anemia 09/17/2011    Conditions to be addressed/monitored per PCP order:  Depression  Care Plan : LCSW Plan of Care  Updates made by Greg Cutter, LCSW since 12/11/2022 12:00 AM     Problem: Depression Identification (Depression)      Long-Range Goal: Depressive Symptoms Identified   Start Date: 09/25/2022  Note:    Priority: High  Timeframe:  Long-Range Goal Priority:  High Start Date:   09/25/22            Expected End Date:  ongoing                     Follow Up Date--12/26/22 at 245 pm  - keep 90 percent of scheduled appointments -consider counseling or psychiatry -consider bumping up your self-care  -consider creating a stronger support network   Why is this important?             Combatting depression  may take some time.            If you don't feel better right away, don't give up on your treatment plan.    Current barriers:   Chronic Mental Health needs related to bipolar, depression and stress. Patient requires Support, Education, Resources, Referrals, Advocacy, and Care Coordination, in order to meet Unmet Mental Health Needs. Patient will implement clinical interventions discussed today to decrease symptoms of depression and increase knowledge and/or ability of: coping skills. Mental Health Concerns and Social Isolation Patient lacks knowledge of available community counseling agencies and resources. Lack of a stable support network  Clinical Goal(s): verbalize understanding of plan for management of Bipolar, Depression, and Stress and demonstrate a reduction in symptoms. Patient will connect with a provider for ongoing mental health treatment, increase coping skills, healthy habits, self-management skills, and stress reduction        Clinical Interventions:  Assessed patient's previous and current treatment, coping skills, support system and barriers to care. Patient provided hx  Verbalization of feelings encouraged, motivational interviewing employed Emotional support provided, positive coping strategies  explored. Establishing healthy boundaries to use with family emphasized and healthy self-care education provided. Patient has had issues in the past with family interfering with her mental health care.  Patient was educated on available mental health resources within their area that accept Medicaid and offer counseling and psychiatry. Email sent to patient today with instructions for scheduling at [facility] as well as some crisis support resources and [facility]'s walk in clinic hours Patient reports that she wishes to change her mental health providers from [facility] to [facility]. Patient reports that her niece is a strong and stable support for her.  Patient is agreeable to referral to [facility]  for counseling and psychiatry. [facility] LCSW made referral on 09/25/22. LCSW provided education on relaxation techniques such as meditation, deep breathing, massage, grounding exercises or yoga that can activate the body's relaxation response and ease symptoms of stress and anxiety. LCSW ask that when pt is struggling with difficult emotions and racing thoughts that they start this relaxation response process. LCSW provided extensive education on healthy coping skills for anxiety. SW used active and reflective listening, validated patient's feelings/concerns, and provided emotional support. Patient will work on implementing appropriate self-care habits into their daily routine such as: staying positive, writing a gratitude list, drinking water, staying active around the house, taking their medications as prescribed, combating negative thoughts or emotions and staying connected with their family and friends. Positive reinforcement provided for this decision to work on this. Past update- Patient reports that her PCP made a new referral to [place] and they contacted her but she is not sure if her insurance is in their network and will be contacting her insurance provider to inquire. Patient reports that she has started group therapy at the [facility] and also an [place] at [employer]. She also signed up for a work training program at [employer] that will provide two weeks of hands on training before setting her up with new employment. Patient also has her initial intake appointment set up with [facility] that [facility] LCSW referred her to but this appointment isn't until next month. 10/25/22 UPDATE- [facility] LCSW talked to patient while she was in the waiting room at [facility] to see a psychiatrist. She already has completed her initial counseling session there and reports feeling comfortable with her current mental health care team. [facility] LCSW will follow up later this month. 11/30/22 UPDATE-  Patient reports that she wishes to continue psychiatry services at [facility] and continue counseling at [facility] with counselor Arby Barrette. She reports that she is on a new medication that she is experiencing crying spells as a side effect. Patient was advised to contact her psychiatrist before her appointment next week to update her provider on this negative side effects. Crisis resource education provided. LCSW 12/11/22 update- Patient reports that she did NOT stop the medication that her psychiatrist prescribed that were causing her adverse side effects but that she did call and schedule for a follow up appointment which is tomorrow. This appointment is virtual. She reports that she has decided to pursue counseling elsewhere after talking with her counseling at [facility] and a recommendation was made for her to follow up with. She reports that this recommendation provides specialized therapy services that she is interested in but she has not taken the time to reach out to this resource yet but will do so this week. [facility] LCSW will follow up next month.  Patient reports that they experience both anxiety and depression.She shares that  he feels alone most days. She was receptive to anxiety and depression management coping skill education. The following coping skill education was provided for stress relief and mental health management: "When your car dies or a deadline looms, how do you respond? Long-term, low-grade or acute stress takes a serious toll on your body and mind, so don't ignore feelings of constant tension. Stress is a natural part of life. However, too much stress can harm our health, especially if it continues every day. This is chronic stress and can put you at risk for heart problems like heart disease and depression. Understand what's happening inside your body and learn simple coping skills to combat the negative impacts of everyday stressors.  Types of Stress There are two types of stress: Emotional -  types of emotional stress are relationship problems, pressure at work, financial worries, experiencing discrimination or having a major life change. Physical - Examples of physical stress include being sick having pain, not sleeping well, recovery from an injury or having an alcohol and drug use disorder. Fight or Flight Sudden or ongoing stress activates your nervous system and floods your bloodstream with adrenaline and cortisol, two hormones that raise blood pressure, increase heart rate and spike blood sugar. These changes pitch your body into a fight or flight response. That enabled our ancestors to outrun saber-toothed tigers, and it's helpful today for situations like dodging a car accident. But most modern chronic stressors, such as finances or a challenging relationship, keep your body in that heightened state, which hurts your health. Effects of Too Much Stress If constantly under stress, most of [place] will eventually start to function less well.  Multiple studies link chronic stress to a higher risk of heart disease, stroke, depression, weight gain, memory loss and even premature death, so it's important to recognize the warning signals. Talk to your doctor about ways to manage stress if you're experiencing any of these symptoms: Prolonged periods of poor sleep. Regular, severe headaches. Unexplained weight loss or gain. Feelings of isolation, withdrawal or worthlessness. Constant anger and irritability. Loss of interest in activities. Constant worrying or obsessive thinking. Excessive alcohol or drug use. Inability to concentrate.  10 Ways to Cope with Chronic Stress It's key to recognize stressful situations as they occur because it allows you to focus on managing how you react. We all need to know when to close our eyes and take a deep breath when we feel tension rising. Use these tips to prevent or reduce chronic stress. 1. Rebalance Work and Home All work and no play? If you're  spending too much time at the office, intentionally put more dates in your calendar to enjoy time for fun, either alone or with others. 2. Get Regular Exercise Moving your body on a regular basis balances the nervous system and increases blood circulation, helping to flush out stress hormones. Even a daily 20-minute walk makes a difference. Any kind of exercise can lower stress and improve your mood ? just pick activities that you enjoy and make it a regular habit. 3. Eat Well and Limit Alcohol and Stimulants Alcohol, nicotine and caffeine may temporarily relieve stress but have negative health impacts and can make stress worse in the long run. Well-nourished bodies cope better, so start with a good breakfast, add more organic fruits and vegetables for a well-balanced diet, avoid processed foods and sugar, try herbal tea and drink more water. 4. Connect with Supportive People Talking face to face with another person releases hormones that  reduce stress. Lean on those good listeners in your life. 5. [place] Time Do you enjoy gardening, reading, listening to music or some other creative pursuit? Engage in activities that bring you pleasure and joy; research shows that reduces stress by almost half and lowers your heart rate, too. 6. Practice Meditation, Stress Reduction or Yoga Relaxation techniques activate a state of restfulness that counterbalances your body's fight-or-flight hormones. Even if this also means a 10-minute break in a long day: listen to music, read, go for a walk in nature, do a hobby, take a bath or spend time with a friend. Also consider doing a mindfulness exercise or try a daily deep breathing or imagery practice. Deep Breathing Slow, calm and deep breathing can help you relax. Try these steps to focus on your breathing and repeat as needed. Find a comfortable position and close your eyes. Exhale and drop your shoulders. Breathe in through your nose; fill your lungs and  then your belly. Think of relaxing your body, quieting your mind and becoming calm and peaceful. Breathe out slowly through your nose, relaxing your belly. Think of releasing tension, pain, worries or distress. Repeat steps three and four until you feel relaxed. Imagery This involves using your mind to excite the senses -- sound, vision, smell, taste and feeling. This may help ease your stress. Begin by getting comfortable and then do some slow breathing. Imagine a place you love being at. It could be somewhere from your childhood, somewhere you vacationed or just a place in your imagination. Feel how it is to be in the place you're imagining. Pay attention to the sounds, air, colors, and who is there with you. This is a place where you feel cared for and loved. All is well. You are safe. Take in all the smells, sounds, tastes and feelings. As you do, feel your body being nourished and healed. Feel the calm that surrounds you. Breathe in all the good. Breathe out any discomfort or tension. 7. Sleep Enough If you get less than seven to eight hours of sleep, your body won't tolerate stress as well as it could. If stress keeps you up at night, address the cause, and add extra meditation into your day to make up for the lost z's. Try to get seven to nine hours of sleep each night. Make a regular bedtime schedule. Keep your room dark and cool. Try to avoid computers, TV, cell phones and tablets before bed. 8. Bond with Connections You Enjoy Go out for a coffee with a friend, chat with a neighbor, call a family member, visit with a clergy member, or even hang out with your pet. Clinical studies show that spending even a short time with a companion animal can cut anxiety levels almost in half. 9. Take a Vacation Getting away from it all can reset your stress tolerance by increasing your mental and emotional outlook, which makes you a happier, more productive person upon return. Leave your cellphone and  laptop at home! 10. See a Counselor, Coach or Therapist If negative thoughts overwhelm your ability to make positive changes, it's time to seek professional help. Make an appointment today--your health and life are worth it."   Depression screen reviewed  PHQ2/ PHQ9 completed or reviewed  Mindfulness or Relaxation training provided Active listening / Reflection utilized  Advance Care and HCPOA education provided Emotional Support Provided Problem [place] strategies reviewed Provided psychoeducation for mental health needs  Provided brief CBT  Reviewed mental  health medications and discussed importance of compliance:  Quality of sleep assessed & Sleep Hygiene techniques promoted  Participation in counseling encouraged  Verbalization of feelings encouraged  Suicidal Ideation/Homicidal Ideation assessed: Patient denies SI/HI  Review resources, discussed options and provided patient information about  [place] care team collaboration (see longitudinal plan of care)  Patient Goals/Self-Care Activities: Over the next 120 days Attend scheduled medical appointments Utilize healthy coping skills and supportive resources discussed Contact PCP with any questions or concerns Keep 90 percent of counseling appointments Call your insurance provider for more information about your Enhanced Benefits  Check out counseling resources provided  Begin personal counseling with LCSW, to reduce and manage symptoms of Depression and Stress, until well-established with mental health provider Accept all calls from representative with [facility] in an effort to establish ongoing mental health counseling and supportive services. Incorporate into daily practice - relaxation techniques, deep breathing exercises, and mindfulness meditation strategies. Talk about feelings with friends, family members, spiritual advisor, etc. Contact LCSW directly [phone]), if you have  questions, need assistance, or if additional social work needs are identified between now and our next scheduled telephone outreach call. Call 988 for mental health hotline/crisis line if needed (24/7 available) Try techniques to reduce symptoms of anxiety/negative thinking (deep breathing, distraction, positive self talk, etc)  - develop a personal safety plan - develop a plan to deal with triggers like holidays, anniversaries - exercise at least 2 to 3 times per week - have a plan for how to handle bad days - journal feelings and what helps to feel better or worse - spend time or talk with others at least 2 to 3 times per week - watch for early signs of feeling worse - begin personal counseling - call and visit an old friend - check out volunteer opportunities - join a support group - laugh; watch a funny movie or comedian - learn and use visualization or guided imagery - perform a random act of kindness - practice relaxation or meditation daily - start or continue a personal journal - practice positive thinking and self-talk -continue with compliance of taking medication  -identify current effective and ineffective coping strategies.  -implement positive self-talk in care to increase self-esteem, confidence and feelings of control.  -consider alternative and complementary therapy approaches such as meditation, mindfulness or yoga.  -journaling, prayer, worship services, meditation or pastoral counseling.  -increase participation in pleasurable group activities such as hobbies, singing, sports or volunteering).  -consider the use of meditative movement therapy such as tai chi, yoga or qigong.  -start a regular daily exercise program based on tolerance, ability and patient choice to support positive thinking and activity    If you are experiencing a Mental Health or [place] or need someone to talk to, please call the Suicide and Crisis Lifeline: 988    Patient Goals:  Follow up goal      Follow up:  Patient agrees to Care Plan and Follow-up.  Plan: The Managed Medicaid care management team will reach out to the patient again over the next 30 days.  Date/time of next scheduled Social Work care management/care coordination outreach:  12/26/22 at 2:45 pm.  Eula Fried, BSW, MSW, [place] Medicaid LCSW [email or web address] Phone: [phone]

## 2022-12-12 ENCOUNTER — Other Ambulatory Visit: Payer: Self-pay | Admitting: Allergy

## 2022-12-12 DIAGNOSIS — F3132 Bipolar disorder, current episode depressed, moderate: Secondary | ICD-10-CM | POA: Diagnosis not present

## 2022-12-18 ENCOUNTER — Other Ambulatory Visit: Payer: Self-pay | Admitting: Allergy

## 2022-12-18 ENCOUNTER — Encounter: Payer: Medicaid Other | Attending: Family | Admitting: Registered"

## 2022-12-18 ENCOUNTER — Encounter: Payer: Self-pay | Admitting: Registered"

## 2022-12-18 DIAGNOSIS — Z23 Encounter for immunization: Secondary | ICD-10-CM | POA: Diagnosis not present

## 2022-12-18 DIAGNOSIS — K219 Gastro-esophageal reflux disease without esophagitis: Secondary | ICD-10-CM | POA: Insufficient documentation

## 2022-12-18 DIAGNOSIS — R636 Underweight: Secondary | ICD-10-CM | POA: Diagnosis not present

## 2022-12-18 DIAGNOSIS — Z713 Dietary counseling and surveillance: Secondary | ICD-10-CM | POA: Diagnosis present

## 2022-12-18 NOTE — Progress Notes (Signed)
Appointment start time: 2:03  Appointment end time: 2:33  Patient was seen on 12/18/2022 for nutrition counseling pertaining to disordered eating  Primary care provider: Jodi Mourning, FNP Therapist: Cheryll Cockayne (sees 1x/month; [place]; Sheilah Pigeon, MD-psychiatrist on 10/2022)   ROI: 08/21/2022 Any other medical team members: none   Assessment  States she has not been having much of an appetite since last week. States she is no longer working with therapist due to busyness of provider schedule. States she has peer support through [facility] and sees once/week. States she is looking for another therapy option. States she was told to call [facility] but has not had a chance to contact them due to working with [facility]. States she has a new psychiatrist as well.   States she doesn't drink soda and has been reducing spicy food intake. States this is helping reduce reflux.   States she weighed herself at her son's allergy appt and noticed she weighed 123 lbs. States Ensure feels heavy and getting tired of drinking them and wants to try other options.    Growth Metrics: Goal rate of weight gain:  0.5-1.0 lb/week  Eating history: Length of time: 1 years Previous treatments: none Goals for RD meetings: improve hair shedding, dizziness/lightheadedness, headaches, focus/concentration, cold intolerance   Weight history:  Recent weight: 123 (pt reports) Change from previous visit: +6.8 lbs from previous visit 116.2 lbs on 09/19/2022 (3 months ago) Highest weight: 115; while pregnant 129  Lowest weight: 90 Most consistent weight: 104-106 What would you like to weigh: no How has weight changed in the past year: weight loss  Medical Information:  Changes in hair, skin, nails since ED started: continued hair shedding, yellowish skin, brittle nails, easily bruised, easily bleeds Chewing/swallowing difficulties: yes, due to acid reflux Reflux or heartburn: yes, now  taking omeprazole Trouble with teeth: has 6 cavities, has trouble with grinding teeth since childhood LMP without the use of hormones: 2/15   Weight at that point:  Effect of exercise on menses: none reported   Effect of hormones on menses: N/A; just had Mirena removed on 8/9, has had it for 9 years Constipation, diarrhea: has BM once/day or once/every other day Dizziness/lightheadedness: sometimes; has improved  Headaches/body aches: no, has improved; body aches have improved as well due to physical therapist Heart racing/chest pain: no, has improved Mood: has more energy during the day Sleep: 7 hrs/night; feels more rested, has improved Focus/concentration: no, has improved  Cold intolerance: no, has improved Vision changes: sometimes, blurred vision  Mental health diagnosis: depression, psychotic disorder, bipolar   Dietary assessment: A typical day consists of 2 meals and 1 snack  Safe foods include: Ramen noodles, stir fry/saute meat & vegetables, white rice, Pha, noodles, hot soupy, burgers, fish, pork, chicken, watermelon  Avoided foods include: blue crab, condiments, fish filets, beef (limits to a few times a year), bread   24 hour recall:  B (11 am): skipped S: L (12:30 pm): Bojangle's 1.5 breasts + leg + mashed potatoes w/ gravy + bo rounds or cobb salad from [place] with ranch dressing + large fruit bowl S:  D (5 pm): cucumber salad with cherry tomatoes + leftover Bojangle's  (1 breast + mashed potatoes + gravy + 4-5 bo rounds)  S (8-9 pm): pho noodles   Beverages: water (2*16 oz; 32 oz), chai tea latte  Physical activity: none reported  What Methods Do You Use To Control Your Weight (Compensatory behaviors)?  Restricting   Estimated energy intake: 1700-1800 kcal  Estimated energy needs: 2000-2200 kcal 250-275 g CHO 150-165 g pro 44-49 g fat  Nutrition Diagnosis: NB-1.5 Disordered eating pattern As related to skipping meals.  As evidenced by pt  reports intentionally skipping meals at times when depressed.  Intervention/Goals: Pt was encouraged with changes made from previous visit. Discussed correlation between increased nourishment and recent improvement in signs/symptoms. Discussed ways to continue to increase nourishment. Reminded to resume 3 snacks a day. Pt agreed with goals listed. Goals: - Aim to have 3 meals + 3 snacks a day.  - Aim to have 3 Ensure a day.   Meal plan:    3 meals    2 snacks   Monitoring and Evaluation: Patient will follow up in 6 weeks.

## 2022-12-18 NOTE — Patient Instructions (Signed)
-   Aim to have 3 meals + 3 snacks a day.   - Aim to have 3 Ensure a day.

## 2022-12-19 ENCOUNTER — Ambulatory Visit: Payer: Medicaid Other | Admitting: Allergy

## 2022-12-23 ENCOUNTER — Other Ambulatory Visit (HOSPITAL_COMMUNITY): Payer: Self-pay | Admitting: Physician Assistant

## 2022-12-23 DIAGNOSIS — G479 Sleep disorder, unspecified: Secondary | ICD-10-CM

## 2022-12-26 ENCOUNTER — Other Ambulatory Visit: Payer: Medicaid Other | Admitting: Licensed Clinical Social Worker

## 2022-12-26 DIAGNOSIS — Z30430 Encounter for insertion of intrauterine contraceptive device: Secondary | ICD-10-CM | POA: Diagnosis not present

## 2022-12-26 DIAGNOSIS — Z3040 Encounter for surveillance of contraceptives, unspecified: Secondary | ICD-10-CM | POA: Diagnosis not present

## 2022-12-26 DIAGNOSIS — Z113 Encounter for screening for infections with a predominantly sexual mode of transmission: Secondary | ICD-10-CM | POA: Diagnosis not present

## 2022-12-26 NOTE — Patient Instructions (Signed)
Visit Information  Ms. Weygandt was given information about Medicaid Managed Care team care coordination services as a part of their [place] Medicaid benefit. Hadasah Mccannon verbally consented to engagement with the [facility] Managed Care team.   If you are experiencing a medical emergency, please call 911 or report to your local emergency department or urgent care.   If you have a non-emergency medical problem during routine business hours, please contact your provider's office and ask to speak with a nurse.   For questions related to your [facility], please call: [phone] or visit the homepage here: [email or web address]  If you would like to schedule transportation through your [facility], please call the following number at least 2 days in advance of your appointment: [phone]   Rides for urgent appointments can also be made after hours by calling [employer].  Call the [place] at [phone], at any time, 24 hours a day, 7 days a week. If you are in danger or need immediate medical attention call 911.  If you would like help to quit smoking, call 1-800-QUIT-NOW 406-350-3745) OR Espaol: 1-855-Djelo-Ya QO:409462) o para ms informacin haga clic aqu or Text READY to 200-400 to register via text   Following is a copy of your plan of care:  Care Plan : LCSW Plan of Care  Updates made by Greg Cutter, LCSW since 12/26/2022 12:00 AM     Problem: Depression Identification (Depression)      Long-Range Goal: Depressive Symptoms Identified   Start Date: 09/25/2022  Note:    Priority: High  Timeframe:  Long-Range Goal Priority:  High Start Date:   09/25/22            Expected End Date:  ongoing                     Follow Up Date--01/07/23 at 330 pm  - keep 90 percent of scheduled appointments -consider counseling or  psychiatry -consider bumping up your self-care  -consider creating a stronger support network   Why is this important?             Combatting depression may take some time.            If you don't feel better right away, don't give up on your treatment plan.    Current barriers:   Chronic Mental Health needs related to bipolar, depression and stress. Patient requires Support, Education, Resources, Referrals, Advocacy, and Care Coordination, in order to meet Unmet Mental Health Needs. Patient will implement clinical interventions discussed today to decrease symptoms of depression and increase knowledge and/or ability of: coping skills. Mental Health Concerns and Social Isolation Patient lacks knowledge of available community counseling agencies and resources. Lack of a stable support network  Clinical Goal(s): verbalize understanding of plan for management of Bipolar, Depression, and Stress and demonstrate a reduction in symptoms. Patient will connect with a provider for ongoing mental health treatment, increase coping skills, healthy habits, self-management skills, and stress reduction        Types of Stress There are two types of stress: Emotional - types of emotional stress are relationship problems, pressure at work, financial worries, experiencing discrimination or having a major life change. Physical - Examples of physical stress include being sick having pain, not sleeping well, recovery from an injury or having an alcohol and drug use disorder. Fight or Flight Sudden or ongoing stress activates your nervous  system and floods your bloodstream with adrenaline and cortisol, two hormones that raise blood pressure, increase heart rate and spike blood sugar. These changes pitch your body into a fight or flight response. That enabled our ancestors to outrun saber-toothed tigers, and it's helpful today for situations like dodging a car accident. But most modern chronic stressors, such as  finances or a challenging relationship, keep your body in that heightened state, which hurts your health. Effects of Too Much Stress If constantly under stress, most of [place] will eventually start to function less well.  Multiple studies link chronic stress to a higher risk of heart disease, stroke, depression, weight gain, memory loss and even premature death, so it's important to recognize the warning signals. Talk to your doctor about ways to manage stress if you're experiencing any of these symptoms: Prolonged periods of poor sleep. Regular, severe headaches. Unexplained weight loss or gain. Feelings of isolation, withdrawal or worthlessness. Constant anger and irritability. Loss of interest in activities. Constant worrying or obsessive thinking. Excessive alcohol or drug use. Inability to concentrate.  10 Ways to Cope with Chronic Stress It's key to recognize stressful situations as they occur because it allows you to focus on managing how you react. We all need to know when to close our eyes and take a deep breath when we feel tension rising. Use these tips to prevent or reduce chronic stress. 1. Rebalance Work and Home All work and no play? If you're spending too much time at the office, intentionally put more dates in your calendar to enjoy time for fun, either alone or with others. 2. Get Regular Exercise Moving your body on a regular basis balances the nervous system and increases blood circulation, helping to flush out stress hormones. Even a daily 20-minute walk makes a difference. Any kind of exercise can lower stress and improve your mood ? just pick activities that you enjoy and make it a regular habit. 3. Eat Well and Limit Alcohol and Stimulants Alcohol, nicotine and caffeine may temporarily relieve stress but have negative health impacts and can make stress worse in the long run. Well-nourished bodies cope better, so start with a good breakfast, add more organic fruits and  vegetables for a well-balanced diet, avoid processed foods and sugar, try herbal tea and drink more water. 4. Connect with Supportive People Talking face to face with another person releases hormones that reduce stress. Lean on those good listeners in your life. 5. [place] Time Do you enjoy gardening, reading, listening to music or some other creative pursuit? Engage in activities that bring you pleasure and joy; research shows that reduces stress by almost half and lowers your heart rate, too. 6. Practice Meditation, Stress Reduction or Yoga Relaxation techniques activate a state of restfulness that counterbalances your body's fight-or-flight hormones. Even if this also means a 10-minute break in a long day: listen to music, read, go for a walk in nature, do a hobby, take a bath or spend time with a friend. Also consider doing a mindfulness exercise or try a daily deep breathing or imagery practice. Deep Breathing Slow, calm and deep breathing can help you relax. Try these steps to focus on your breathing and repeat as needed. Find a comfortable position and close your eyes. Exhale and drop your shoulders. Breathe in through your nose; fill your lungs and then your belly. Think of relaxing your body, quieting your mind and becoming calm and peaceful. Breathe out slowly through your nose, relaxing your  belly. Think of releasing tension, pain, worries or distress. Repeat steps three and four until you feel relaxed. Imagery This involves using your mind to excite the senses -- sound, vision, smell, taste and feeling. This may help ease your stress. Begin by getting comfortable and then do some slow breathing. Imagine a place you love being at. It could be somewhere from your childhood, somewhere you vacationed or just a place in your imagination. Feel how it is to be in the place you're imagining. Pay attention to the sounds, air, colors, and who is there with you. This is a place where you  feel cared for and loved. All is well. You are safe. Take in all the smells, sounds, tastes and feelings. As you do, feel your body being nourished and healed. Feel the calm that surrounds you. Breathe in all the good. Breathe out any discomfort or tension. 7. Sleep Enough If you get less than seven to eight hours of sleep, your body won't tolerate stress as well as it could. If stress keeps you up at night, address the cause, and add extra meditation into your day to make up for the lost z's. Try to get seven to nine hours of sleep each night. Make a regular bedtime schedule. Keep your room dark and cool. Try to avoid computers, TV, cell phones and tablets before bed. 8. Bond with Connections You Enjoy Go out for a coffee with a friend, chat with a neighbor, call a family member, visit with a clergy member, or even hang out with your pet. Clinical studies show that spending even a short time with a companion animal can cut anxiety levels almost in half. 9. Take a Vacation Getting away from it all can reset your stress tolerance by increasing your mental and emotional outlook, which makes you a happier, more productive person upon return. Leave your cellphone and laptop at home! 10. See a Counselor, Coach or Therapist If negative thoughts overwhelm your ability to make positive changes, it's time to seek professional help. Make an appointment today--your health and life are worth it."   Depression screen reviewed  PHQ2/ PHQ9 completed or reviewed  Mindfulness or Relaxation training provided Active listening / Reflection utilized  Advance Care and HCPOA education provided Emotional Support Provided Problem [place] strategies reviewed Provided psychoeducation for mental health needs  Provided brief CBT  Reviewed mental health medications and discussed importance of compliance:  Quality of sleep assessed & Sleep Hygiene techniques promoted  Participation in counseling encouraged   Verbalization of feelings encouraged  Suicidal Ideation/Homicidal Ideation assessed: Patient denies SI/HI  Review resources, discussed options and provided patient information about  [place] care team collaboration (see longitudinal plan of care)  Patient Goals/Self-Care Activities: Over the next 120 days Attend scheduled medical appointments Utilize healthy coping skills and supportive resources discussed Contact PCP with any questions or concerns Keep 90 percent of counseling appointments Call your insurance provider for more information about your Enhanced Benefits  Check out counseling resources provided  Begin personal counseling with LCSW, to reduce and manage symptoms of Depression and Stress, until well-established with mental health provider Accept all calls from representative with [facility] in an effort to establish ongoing mental health counseling and supportive services. Incorporate into daily practice - relaxation techniques, deep breathing exercises, and mindfulness meditation strategies. Talk about feelings with friends, family members, spiritual advisor, etc. Contact LCSW directly [phone]), if you have questions, need assistance, or if additional social work needs  are identified between now and our next scheduled telephone outreach call. Call 988 for mental health hotline/crisis line if needed (24/7 available) Try techniques to reduce symptoms of anxiety/negative thinking (deep breathing, distraction, positive self talk, etc)  - develop a personal safety plan - develop a plan to deal with triggers like holidays, anniversaries - exercise at least 2 to 3 times per week - have a plan for how to handle bad days - journal feelings and what helps to feel better or worse - spend time or talk with others at least 2 to 3 times per week - watch for early signs of feeling worse - begin personal counseling - call and visit an old friend -  check out volunteer opportunities - join a support group - laugh; watch a funny movie or comedian - learn and use visualization or guided imagery - perform a random act of kindness - practice relaxation or meditation daily - start or continue a personal journal - practice positive thinking and self-talk -continue with compliance of taking medication  -identify current effective and ineffective coping strategies.  -implement positive self-talk in care to increase self-esteem, confidence and feelings of control.  -consider alternative and complementary therapy approaches such as meditation, mindfulness or yoga.  -journaling, prayer, worship services, meditation or pastoral counseling.  -increase participation in pleasurable group activities such as hobbies, singing, sports or volunteering).  -consider the use of meditative movement therapy such as tai chi, yoga or qigong.  -start a regular daily exercise program based on tolerance, ability and patient choice to support positive thinking and activity    If you are experiencing a Mental Health or [place] or need someone to talk to, please call the Suicide and Crisis Lifeline: [phone]    Patient Goals: Follow up goal

## 2022-12-26 NOTE — Patient Outreach (Signed)
Medicaid Managed Care Social Work Note  12/26/2022 Name:  Kaitlyn Crane MRN:  [id number] DOB:  [birth year]  Kaitlyn Crane is an 27 y.o. year old female who is a primary patient of [name], [place].  The Medicaid Managed Care Coordination team was consulted for assistance with:  [place] and Resources  Ms. Parchment was given information about Medicaid Managed Care Coordination team services today. Ylonda Sjogren Patient agreed to services and verbal consent obtained.  Engaged with patient  for by telephone forfollow up visit in response to referral for case management and/or care coordination services.   Assessments/Interventions:  Review of past medical history, allergies, medications, health status, including review of consultants reports, laboratory and other test data, was performed as part of comprehensive evaluation and provision of chronic care management services.  SDOH: (Social Determinant of Health) assessments and interventions performed: SDOH Interventions    Flowsheet Row Patient Outreach Telephone from 12/26/2022 in [place] Patient Outreach Telephone from 12/11/2022 in [place] Patient Outreach Telephone from 11/30/2022 in [place] Patient Outreach Telephone from 10/10/2022 in [place] Patient Outreach Telephone from 09/25/2022 in [place] [facility] from 05/29/2022 in [place] at [place]  SDOH Interventions        Depression Interventions/Treatment  -- -- -- -- Counseling, Currently on Treatment Counseling  Stress Interventions Offered [employer], Provide Counseling Offered [employer] Resources, Provide Counseling Offered [facility], Provide Counseling Offered [facility], Provide Counseling Offered [employer]  Resources, Provide Counseling --       Advanced Directives Status:  See Care Plan for related entries.  Care Plan                 Allergies  Allergen Reactions   Shellfish Allergy Other (See Comments)    Swelling of tonsils   Carbamide Peroxide Rash   Nickel Sulfate [Nickel] Rash    Medications Reviewed Today     Reviewed by Ardelle Lesches, RD (Registered Dietitian) on 12/18/22 at [place] List Status: <None>   Medication Order Taking? Sig Documenting Provider Last Dose Status Informant  cromolyn (OPTICROM) 4 % ophthalmic solution [id number]  Place 1 drop into both eyes 4 (four) times daily as needed. [name], MD  Active   EPIPEN 2-PAK 0.3 MG/0.3ML SOAJ injection [id number]  Inject 0.3 mg into the muscle as needed for anaphylaxis. [name], NP  Active   FLUoxetine (PROZAC) 10 MG capsule [id number]  Take 1 capsule (10 mg total) by mouth daily. [name], PA  Active   fluticasone (FLONASE) 50 MCG/ACT nasal spray [id number]  Place 2 sprays into both nostrils daily as needed for allergies or rhinitis. [name], MD  Active   lamoTRIgine (LAMICTAL) 25 MG tablet [id number] Yes Take 25 mg by mouth daily. [provider]  Active   levocetirizine (XYZAL) 5 MG tablet [id number]  Take 1 tablet (5 mg total) by mouth daily as needed. [name], MD  Active   lurasidone (LATUDA) 20 MG TABS tablet [id number] No Take by mouth.  Patient not taking: Reported on 12/18/2022   [provider] Not Taking Active   Multiple Vitamin (MULTIVITAMIN WITH MINERALS) TABS tablet [id number]  Take 1 tablet by mouth daily. [name], MD  Consider Medication Status and Discontinue   omeprazole (PRILOSEC OTC) 20 MG tablet [id number]  Take  20 mg by mouth 2 (two) times daily before a meal. [provider]  Active   traZODone (DESYREL) 50 MG tablet [id number]  Take 1 tablet (50 mg total) by mouth at bedtime. [name], PA  Active    triamcinolone ointment (KENALOG) 0.1 % [id number]  Apply sparingly to affected areas twice daily as needed below face and neck [name], MD  Active   VENTOLIN HFA 108 (90 Base) MCG/ACT inhaler [id number]  TAKE 2 PUFFS BY MOUTH EVERY 6 HOURS AS NEEDED FOR WHEEZE OR SHORTNESS OF BREATH [name], MD  Active   vitamin B-12 1000 MCG tablet [id number]  Take 1 tablet (1,000 mcg total) by mouth daily. [name], MD  Active   Vitamin D, Ergocalciferol, (DRISDOL) 1.25 MG (50000 UNIT) CAPS capsule [id number]  Take 50,000 Units by mouth every 7 (seven) days. 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   Encounter for insertion of mirena IUD - inserted 12/12/12 12/12/2012   SVD (spontaneous vaginal delivery) 09/23/2012   Chorioamnionitis 09/23/2012   Late prenatal care 08/27/2012   Normal pregnancy, first 08/11/2012   Anemia 09/17/2011    Conditions to be addressed/monitored per PCP order:  Depression  Care Plan : LCSW Plan of Care  Updates made by Greg Cutter, LCSW since 12/26/2022 12:00 AM     Problem: Depression Identification (Depression)      Long-Range Goal: Depressive Symptoms Identified   Start Date: 09/25/2022  Note:    Priority: High  Timeframe:  Long-Range Goal Priority:  High Start Date:   09/25/22            Expected End Date:  ongoing                     Follow Up Date--01/07/23 at 330 pm  - keep 90 percent of scheduled appointments -consider  counseling or psychiatry -consider bumping up your self-care  -consider creating a stronger support network   Why is this important?             Combatting depression may take some time.            If you don't feel better right away, don't give up on your treatment plan.    Current barriers:   Chronic Mental Health needs related to bipolar, depression and stress. Patient requires Support, Education, Resources, Referrals, Advocacy, and Care Coordination, in order to meet Unmet Mental Health Needs. Patient will implement clinical interventions discussed today to decrease symptoms of depression and increase knowledge and/or ability of: coping skills. Mental Health Concerns and Social Isolation Patient lacks knowledge of available community counseling agencies and resources. Lack of a stable support network  Clinical Goal(s): verbalize understanding of plan for management of Bipolar, Depression, and Stress and demonstrate a reduction in symptoms. Patient will connect with a provider for ongoing mental health treatment, increase coping skills, healthy habits, self-management skills, and stress reduction  Clinical Interventions:  Assessed patient's previous and current treatment, coping skills, support system and barriers to care. Patient provided hx  Verbalization of feelings encouraged, motivational interviewing employed Emotional support provided, positive coping strategies explored. Establishing healthy boundaries to use with family emphasized and healthy self-care education provided. Patient has had issues in the past with family interfering with her mental health care.  Patient was educated on available mental health resources within their area that accept Medicaid and offer counseling and psychiatry. Email sent to patient today with instructions for scheduling at [facility] as well as some crisis support resources and [facility]'s walk in clinic hours Patient reports that she wishes to change  her mental health providers from [facility] to [facility]. Patient reports that her niece is a strong and stable support for her.  Patient is agreeable to referral to [facility] for counseling and psychiatry. [facility] LCSW made referral on 09/25/22. LCSW provided education on relaxation techniques such as meditation, deep breathing, massage, grounding exercises or yoga that can activate the body's relaxation response and ease symptoms of stress and anxiety. LCSW ask that when pt is struggling with difficult emotions and racing thoughts that they start this relaxation response process. LCSW provided extensive education on healthy coping skills for anxiety. SW used active and reflective listening, validated patient's feelings/concerns, and provided emotional support. Patient will work on implementing appropriate self-care habits into their daily routine such as: staying positive, writing a gratitude list, drinking water, staying active around the house, taking their medications as prescribed, combating negative thoughts or emotions and staying connected with their family and friends. Positive reinforcement provided for this decision to work on this. Past update- Patient reports that her PCP made a new referral to [facility] and they contacted her but she is not sure if her insurance is in their network and will be contacting her insurance provider to inquire. Patient reports that she has started group therapy at the [facility] and also an [facility] at [employer]. She also signed up for a work training program at [employer] that will provide two weeks of hands on training before setting her up with new employment. Patient also has her initial intake appointment set up with [facility] that [facility] LCSW referred her to but this appointment isn't until next month. 10/25/22 UPDATE- [facility] LCSW talked to patient while she was in the waiting room at [facility] to see a psychiatrist. She already has completed  her initial counseling session there and reports feeling comfortable with her current mental health care team. [facility] LCSW will follow up later this month. 11/30/22 UPDATE- Patient reports that she wishes to continue psychiatry services at [facility] and continue counseling at [facility] with counselor Arby Barrette. She reports that she is on a new medication that she is experiencing crying spells as a side effect. Patient was advised to contact her psychiatrist before her appointment next week to update her provider on this negative side effects. Crisis resource education provided. LCSW 12/11/22 update- Patient reports that she did NOT stop the medication that her psychiatrist prescribed that were causing her adverse side effects but that she did call and schedule for a follow up appointment which is tomorrow. This appointment is virtual. She reports that she has decided to pursue counseling elsewhere after talking with her counseling at [facility] and a recommendation was made for her to follow up with. She reports that this recommendation provides specialized therapy services that she is interested in but she has not taken  the time to reach out to this resource yet but will do so this week. [facility] LCSW will follow up next month. [facility] LCSW 12/26/22 Update- Patient reports that she has an upcoming psychiatry appointment next week but sent an email to her psychiatrist asking for an earlier appointment due to experiencing ongoing adverse side effects of her current medication that the psychiatrist prescribed. Patient reports that she is currently getting enrolled with [facility] in order to start EMDR therapy. Positive reinforcement provided for her ongoing active self-care implementation.  Patient reports that they experience both anxiety and depression.She shares that he feels alone most days. She was receptive to anxiety and depression management coping skill education. The following coping skill education was  provided for stress relief and mental health management: "When your car dies or a deadline looms, how do you respond? Long-term, low-grade or acute stress takes a serious toll on your body and mind, so don't ignore feelings of constant tension. Stress is a natural part of life. However, too much stress can harm our health, especially if it continues every day. This is chronic stress and can put you at risk for heart problems like heart disease and depression. Understand what's happening inside your body and learn simple coping skills to combat the negative impacts of everyday stressors.  Types of Stress There are two types of stress: Emotional - types of emotional stress are relationship problems, pressure at work, financial worries, experiencing discrimination or having a major life change. Physical - Examples of physical stress include being sick having pain, not sleeping well, recovery from an injury or having an alcohol and drug use disorder. Fight or Flight Sudden or ongoing stress activates your nervous system and floods your bloodstream with adrenaline and cortisol, two hormones that raise blood pressure, increase heart rate and spike blood sugar. These changes pitch your body into a fight or flight response. That enabled our ancestors to outrun saber-toothed tigers, and it's helpful today for situations like dodging a car accident. But most modern chronic stressors, such as finances or a challenging relationship, keep your body in that heightened state, which hurts your health. Effects of Too Much Stress If constantly under stress, most of [place] will eventually start to function less well.  Multiple studies link chronic stress to a higher risk of heart disease, stroke, depression, weight gain, memory loss and even premature death, so it's important to recognize the warning signals. Talk to your doctor about ways to manage stress if you're experiencing any of these symptoms: Prolonged periods of poor  sleep. Regular, severe headaches. Unexplained weight loss or gain. Feelings of isolation, withdrawal or worthlessness. Constant anger and irritability. Loss of interest in activities. Constant worrying or obsessive thinking. Excessive alcohol or drug use. Inability to concentrate.  10 Ways to Cope with Chronic Stress It's key to recognize stressful situations as they occur because it allows you to focus on managing how you react. We all need to know when to close our eyes and take a deep breath when we feel tension rising. Use these tips to prevent or reduce chronic stress. 1. Rebalance Work and Home All work and no play? If you're spending too much time at the office, intentionally put more dates in your calendar to enjoy time for fun, either alone or with others. 2. Get Regular Exercise Moving your body on a regular basis balances the nervous system and increases blood circulation, helping to flush out stress hormones. Even a daily 20-minute walk makes  a difference. Any kind of exercise can lower stress and improve your mood ? just pick activities that you enjoy and make it a regular habit. 3. Eat Well and Limit Alcohol and Stimulants Alcohol, nicotine and caffeine may temporarily relieve stress but have negative health impacts and can make stress worse in the long run. Well-nourished bodies cope better, so start with a good breakfast, add more organic fruits and vegetables for a well-balanced diet, avoid processed foods and sugar, try herbal tea and drink more water. 4. Connect with Supportive People Talking face to face with another person releases hormones that reduce stress. Lean on those good listeners in your life. 5. [place] Time Do you enjoy gardening, reading, listening to music or some other creative pursuit? Engage in activities that bring you pleasure and joy; research shows that reduces stress by almost half and lowers your heart rate, too. 6. Practice Meditation, Stress  Reduction or Yoga Relaxation techniques activate a state of restfulness that counterbalances your body's fight-or-flight hormones. Even if this also means a 10-minute break in a long day: listen to music, read, go for a walk in nature, do a hobby, take a bath or spend time with a friend. Also consider doing a mindfulness exercise or try a daily deep breathing or imagery practice. Deep Breathing Slow, calm and deep breathing can help you relax. Try these steps to focus on your breathing and repeat as needed. Find a comfortable position and close your eyes. Exhale and drop your shoulders. Breathe in through your nose; fill your lungs and then your belly. Think of relaxing your body, quieting your mind and becoming calm and peaceful. Breathe out slowly through your nose, relaxing your belly. Think of releasing tension, pain, worries or distress. Repeat steps three and four until you feel relaxed. Imagery This involves using your mind to excite the senses -- sound, vision, smell, taste and feeling. This may help ease your stress. Begin by getting comfortable and then do some slow breathing. Imagine a place you love being at. It could be somewhere from your childhood, somewhere you vacationed or just a place in your imagination. Feel how it is to be in the place you're imagining. Pay attention to the sounds, air, colors, and who is there with you. This is a place where you feel cared for and loved. All is well. You are safe. Take in all the smells, sounds, tastes and feelings. As you do, feel your body being nourished and healed. Feel the calm that surrounds you. Breathe in all the good. Breathe out any discomfort or tension. 7. Sleep Enough If you get less than seven to eight hours of sleep, your body won't tolerate stress as well as it could. If stress keeps you up at night, address the cause, and add extra meditation into your day to make up for the lost z's. Try to get seven to nine hours of sleep  each night. Make a regular bedtime schedule. Keep your room dark and cool. Try to avoid computers, TV, cell phones and tablets before bed. 8. Bond with Connections You Enjoy Go out for a coffee with a friend, chat with a neighbor, call a family member, visit with a clergy member, or even hang out with your pet. Clinical studies show that spending even a short time with a companion animal can cut anxiety levels almost in half. 9. Take a Vacation Getting away from it all can reset your stress tolerance by increasing your mental and  emotional outlook, which makes you a happier, more productive person upon return. Leave your cellphone and laptop at home! 10. See a Counselor, Coach or Therapist If negative thoughts overwhelm your ability to make positive changes, it's time to seek professional help. Make an appointment today--your health and life are worth it."   Depression screen reviewed  PHQ2/ PHQ9 completed or reviewed  Mindfulness or Relaxation training provided Active listening / Reflection utilized  Advance Care and HCPOA education provided Emotional Support Provided Problem [place] strategies reviewed Provided psychoeducation for mental health needs  Provided brief CBT  Reviewed mental health medications and discussed importance of compliance:  Quality of sleep assessed & Sleep Hygiene techniques promoted  Participation in counseling encouraged  Verbalization of feelings encouraged  Suicidal Ideation/Homicidal Ideation assessed: Patient denies SI/HI  Review resources, discussed options and provided patient information about  [place] care team collaboration (see longitudinal plan of care)  Patient Goals/Self-Care Activities: Over the next 120 days Attend scheduled medical appointments Utilize healthy coping skills and supportive resources discussed Contact PCP with any questions or concerns Keep 90 percent of counseling  appointments Call your insurance provider for more information about your Enhanced Benefits  Check out counseling resources provided  Begin personal counseling with LCSW, to reduce and manage symptoms of Depression and Stress, until well-established with mental health provider Accept all calls from representative with [facility] in an effort to establish ongoing mental health counseling and supportive services. Incorporate into daily practice - relaxation techniques, deep breathing exercises, and mindfulness meditation strategies. Talk about feelings with friends, family members, spiritual advisor, etc. Contact LCSW directly [phone]), if you have questions, need assistance, or if additional social work needs are identified between now and our next scheduled telephone outreach call. Call 988 for mental health hotline/crisis line if needed (24/7 available) Try techniques to reduce symptoms of anxiety/negative thinking (deep breathing, distraction, positive self talk, etc)  - develop a personal safety plan - develop a plan to deal with triggers like holidays, anniversaries - exercise at least 2 to 3 times per week - have a plan for how to handle bad days - journal feelings and what helps to feel better or worse - spend time or talk with others at least 2 to 3 times per week - watch for early signs of feeling worse - begin personal counseling - call and visit an old friend - check out volunteer opportunities - join a support group - laugh; watch a funny movie or comedian - learn and use visualization or guided imagery - perform a random act of kindness - practice relaxation or meditation daily - start or continue a personal journal - practice positive thinking and self-talk -continue with compliance of taking medication  -identify current effective and ineffective coping strategies.  -implement positive self-talk in care to increase self-esteem, confidence and feelings of control.   -consider alternative and complementary therapy approaches such as meditation, mindfulness or yoga.  -journaling, prayer, worship services, meditation or pastoral counseling.  -increase participation in pleasurable group activities such as hobbies, singing, sports or volunteering).  -consider the use of meditative movement therapy such as tai chi, yoga or qigong.  -start a regular daily exercise program based on tolerance, ability and patient choice to support positive thinking and activity    If you are experiencing a Mental Health or [place] or need someone to talk to, please call the Suicide and Crisis Lifeline: 988    Patient Goals: Follow up  goal      Follow up:  Patient agrees to Care Plan and Follow-up.  Plan: The Managed Medicaid care management team will reach out to the patient again over the next 30 days.  Date/time of next scheduled Social Work care management/care coordination outreach:  01/07/23 at 330 pm.  Eula Fried, [place], MSW, [place] Medicaid LCSW [email or web address] Phone: [phone]

## 2022-12-28 ENCOUNTER — Encounter (HOSPITAL_COMMUNITY): Payer: Self-pay

## 2022-12-28 ENCOUNTER — Ambulatory Visit (HOSPITAL_COMMUNITY): Payer: Medicaid Other | Admitting: Clinical

## 2022-12-31 DIAGNOSIS — F3132 Bipolar disorder, current episode depressed, moderate: Secondary | ICD-10-CM | POA: Diagnosis not present

## 2023-01-02 DIAGNOSIS — R3121 Asymptomatic microscopic hematuria: Secondary | ICD-10-CM | POA: Diagnosis not present

## 2023-01-02 DIAGNOSIS — N3021 Other chronic cystitis with hematuria: Secondary | ICD-10-CM | POA: Diagnosis not present

## 2023-01-07 ENCOUNTER — Other Ambulatory Visit: Payer: Medicaid Other | Admitting: Licensed Clinical Social Worker

## 2023-01-07 NOTE — Patient Instructions (Signed)
Visit Information  Kaitlyn Crane was given information about Medicaid Managed Care team care coordination services as a part of their [place] benefit. Kaitlyn Crane verbally consented to engagement with the [facility] Managed Care team.   If you are experiencing a medical emergency, please call 911 or report to your local emergency department or urgent care.   If you have a non-emergency medical problem during routine business hours, please contact your provider's office and ask to speak with a nurse.   For questions related to your [facility], please call: [phone] or visit the homepage here: [email or web address]  If you would like to schedule transportation through your [facility], please call the following number at least 2 days in advance of your appointment: [phone]   Rides for urgent appointments can also be made after hours by calling [employer] Services.  Call the [place] at [phone], at any time, 24 hours a day, 7 days a week. If you are in danger or need immediate medical attention call 911.  If you would like help to quit smoking, call 1-800-QUIT-NOW (819)778-3332) OR Espaol: 1-855-Djelo-Ya HD:1601594) o para ms informacin haga clic aqu or Text READY to 200-400 to register via text   Following is a copy of your plan of care:  Care Plan : LCSW Plan of Care  Updates made by Greg Cutter, LCSW since 01/07/2023 12:00 AM     Problem: Depression Identification (Depression)      Long-Range Goal: Depressive Symptoms Identified   Start Date: 09/25/2022  Note:    Priority: High  Timeframe:  Long-Range Goal Priority:  High Start Date:   09/25/22            Expected End Date:  ongoing                     Follow Up Date--02/04/23 at 315 pm  - keep 90 percent of scheduled appointments -consider counseling or  psychiatry -consider bumping up your self-care  -consider creating a stronger support network   Why is this important?             Combatting depression may take some time.            If you don't feel better right away, don't give up on your treatment plan.    Current barriers:   Chronic Mental Health needs related to bipolar, depression and stress. Patient requires Support, Education, Resources, Referrals, Advocacy, and Care Coordination, in order to meet Unmet Mental Health Needs. Patient will implement clinical interventions discussed today to decrease symptoms of depression and increase knowledge and/or ability of: coping skills. Mental Health Concerns and Social Isolation Patient lacks knowledge of available community counseling agencies and resources. Lack of a stable support network  Clinical Goal(s): verbalize understanding of plan for management of Bipolar, Depression, and Stress and demonstrate a reduction in symptoms. Patient will connect with a provider for ongoing mental health treatment, increase coping skills, healthy habits, self-management skills, and stress reduction        Types of Stress There are two types of stress: Emotional - types of emotional stress are relationship problems, pressure at work, financial worries, experiencing discrimination or having a major life change. Physical - Examples of physical stress include being sick having pain, not sleeping well, recovery from an injury or having an alcohol and drug use disorder. Fight or Flight Sudden or ongoing stress activates your nervous  system and floods your bloodstream with adrenaline and cortisol, two hormones that raise blood pressure, increase heart rate and spike blood sugar. These changes pitch your body into a fight or flight response. That enabled our ancestors to outrun saber-toothed tigers, and it's helpful today for situations like dodging a car accident. But most modern chronic stressors, such as  finances or a challenging relationship, keep your body in that heightened state, which hurts your health. Effects of Too Much Stress If constantly under stress, most of [place] will eventually start to function less well.  Multiple studies link chronic stress to a higher risk of heart disease, stroke, depression, weight gain, memory loss and even premature death, so it's important to recognize the warning signals. Talk to your doctor about ways to manage stress if you're experiencing any of these symptoms: Prolonged periods of poor sleep. Regular, severe headaches. Unexplained weight loss or gain. Feelings of isolation, withdrawal or worthlessness. Constant anger and irritability. Loss of interest in activities. Constant worrying or obsessive thinking. Excessive alcohol or drug use. Inability to concentrate.  10 Ways to Cope with Chronic Stress It's key to recognize stressful situations as they occur because it allows you to focus on managing how you react. We all need to know when to close our eyes and take a deep breath when we feel tension rising. Use these tips to prevent or reduce chronic stress. 1. Rebalance Work and Home All work and no play? If you're spending too much time at the office, intentionally put more dates in your calendar to enjoy time for fun, either alone or with others. 2. Get Regular Exercise Moving your body on a regular basis balances the nervous system and increases blood circulation, helping to flush out stress hormones. Even a daily 20-minute walk makes a difference. Any kind of exercise can lower stress and improve your mood ? just pick activities that you enjoy and make it a regular habit. 3. Eat Well and Limit Alcohol and Stimulants Alcohol, nicotine and caffeine may temporarily relieve stress but have negative health impacts and can make stress worse in the long run. Well-nourished bodies cope better, so start with a good breakfast, add more organic fruits and  vegetables for a well-balanced diet, avoid processed foods and sugar, try herbal tea and drink more water. 4. Connect with Supportive People Talking face to face with another person releases hormones that reduce stress. Lean on those good listeners in your life. 5. [place] Time Do you enjoy gardening, reading, listening to music or some other creative pursuit? Engage in activities that bring you pleasure and joy; research shows that reduces stress by almost half and lowers your heart rate, too. 6. Practice Meditation, Stress Reduction or Yoga Relaxation techniques activate a state of restfulness that counterbalances your body's fight-or-flight hormones. Even if this also means a 10-minute break in a long day: listen to music, read, go for a walk in nature, do a hobby, take a bath or spend time with a friend. Also consider doing a mindfulness exercise or try a daily deep breathing or imagery practice. Deep Breathing Slow, calm and deep breathing can help you relax. Try these steps to focus on your breathing and repeat as needed. Find a comfortable position and close your eyes. Exhale and drop your shoulders. Breathe in through your nose; fill your lungs and then your belly. Think of relaxing your body, quieting your mind and becoming calm and peaceful. Breathe out slowly through your nose, relaxing your  belly. Think of releasing tension, pain, worries or distress. Repeat steps three and four until you feel relaxed. Imagery This involves using your mind to excite the senses -- sound, vision, smell, taste and feeling. This may help ease your stress. Begin by getting comfortable and then do some slow breathing. Imagine a place you love being at. It could be somewhere from your childhood, somewhere you vacationed or just a place in your imagination. Feel how it is to be in the place you're imagining. Pay attention to the sounds, air, colors, and who is there with you. This is a place where you  feel cared for and loved. All is well. You are safe. Take in all the smells, sounds, tastes and feelings. As you do, feel your body being nourished and healed. Feel the calm that surrounds you. Breathe in all the good. Breathe out any discomfort or tension. 7. Sleep Enough If you get less than seven to eight hours of sleep, your body won't tolerate stress as well as it could. If stress keeps you up at night, address the cause, and add extra meditation into your day to make up for the lost z's. Try to get seven to nine hours of sleep each night. Make a regular bedtime schedule. Keep your room dark and cool. Try to avoid computers, TV, cell phones and tablets before bed. 8. Bond with Connections You Enjoy Go out for a coffee with a friend, chat with a neighbor, call a family member, visit with a clergy member, or even hang out with your pet. Clinical studies show that spending even a short time with a companion animal can cut anxiety levels almost in half. 9. Take a Vacation Getting away from it all can reset your stress tolerance by increasing your mental and emotional outlook, which makes you a happier, more productive person upon return. Leave your cellphone and laptop at home! 10. See a Counselor, Coach or Therapist If negative thoughts overwhelm your ability to make positive changes, it's time to seek professional help. Make an appointment today--your health and life are worth it."   Depression screen reviewed  PHQ2/ PHQ9 completed or reviewed  Mindfulness or Relaxation training provided Active listening / Reflection utilized  Advance Care and HCPOA education provided Emotional Support Provided Problem [place] strategies reviewed Provided psychoeducation for mental health needs  Provided brief CBT  Reviewed mental health medications and discussed importance of compliance:  Quality of sleep assessed & Sleep Hygiene techniques promoted  Participation in counseling encouraged   Verbalization of feelings encouraged  Suicidal Ideation/Homicidal Ideation assessed: Patient denies SI/HI  Review resources, discussed options and provided patient information about  [place] care team collaboration (see longitudinal plan of care)  Patient Goals/Self-Care Activities: Over the next 120 days Attend scheduled medical appointments Utilize healthy coping skills and supportive resources discussed Contact PCP with any questions or concerns Keep 90 percent of counseling appointments Call your insurance provider for more information about your Enhanced Benefits  Check out counseling resources provided  Begin personal counseling with LCSW, to reduce and manage symptoms of Depression and Stress, until well-established with mental health provider Accept all calls from representative with [facility] in an effort to establish ongoing mental health counseling and supportive services. Incorporate into daily practice - relaxation techniques, deep breathing exercises, and mindfulness meditation strategies. Talk about feelings with friends, family members, spiritual advisor, etc. Contact LCSW directly [phone]), if you have questions, need assistance, or if additional social work needs  are identified between now and our next scheduled telephone outreach call. Call 988 for mental health hotline/crisis line if needed (24/7 available) Try techniques to reduce symptoms of anxiety/negative thinking (deep breathing, distraction, positive self talk, etc)  - develop a personal safety plan - develop a plan to deal with triggers like holidays, anniversaries - exercise at least 2 to 3 times per week - have a plan for how to handle bad days - journal feelings and what helps to feel better or worse - spend time or talk with others at least 2 to 3 times per week - watch for early signs of feeling worse - begin personal counseling - call and visit an old friend -  check out volunteer opportunities - join a support group - laugh; watch a funny movie or comedian - learn and use visualization or guided imagery - perform a random act of kindness - practice relaxation or meditation daily - start or continue a personal journal - practice positive thinking and self-talk -continue with compliance of taking medication  -identify current effective and ineffective coping strategies.  -implement positive self-talk in care to increase self-esteem, confidence and feelings of control.  -consider alternative and complementary therapy approaches such as meditation, mindfulness or yoga.  -journaling, prayer, worship services, meditation or pastoral counseling.  -increase participation in pleasurable group activities such as hobbies, singing, sports or volunteering).  -consider the use of meditative movement therapy such as tai chi, yoga or qigong.  -start a regular daily exercise program based on tolerance, ability and patient choice to support positive thinking and activity    If you are experiencing a Mental Health or [place] or need someone to talk to, please call the Suicide and Crisis Lifeline: [phone]    Patient Goals: Follow up goal

## 2023-01-07 NOTE — Patient Outreach (Signed)
Medicaid Managed Care Social Work Note  01/07/2023 Name:  Kaitlyn Crane MRN:  [id number] DOB:  1996/01/17  Kaitlyn Crane is an 27 y.o. year old female who is a primary patient of [name], [name].  The Medicaid Managed Care Coordination team was consulted for assistance with:  [place] and Resources  Ms. Gwin was given information about Medicaid Managed Care Coordination team services today. Kaitlyn Crane Patient agreed to services and verbal consent obtained.  Engaged with patient  for by telephone forfollow up visit in response to referral for case management and/or care coordination services.   Assessments/Interventions:  Review of past medical history, allergies, medications, health status, including review of consultants reports, laboratory and other test data, was performed as part of comprehensive evaluation and provision of chronic care management services.  SDOH: (Social Determinant of Health) assessments and interventions performed: SDOH Interventions    Flowsheet Row Patient Outreach Telephone from 01/07/2023 in [place] Patient Outreach Telephone from 12/26/2022 in [place] Patient Outreach Telephone from 12/11/2022 in [place] Patient Outreach Telephone from 11/30/2022 in [place] Office Visit from 10/24/2022 in [facility] Counselor from 10/22/2022 in [facility]  SDOH Interventions        Depression Interventions/Treatment  -- -- -- -- Medication, Counseling Counseling, Medication, Referral to Psychiatry  Stress Interventions Offered [employer] Resources, Provide Counseling Offered Community Wellness Resources, Provide Counseling Offered Community Wellness Resources, Provide Counseling Offered [employer] Resources, Provide Counseling -- --       Advanced Directives Status:   See Care Plan for related entries.  Care Plan                 Allergies  Allergen Reactions   Shellfish Allergy Other (See Comments)    Swelling of tonsils   Carbamide Peroxide Rash   Nickel Sulfate [Nickel] Rash    Medications Reviewed Today     Reviewed by Ardelle Lesches, RD (Registered Dietitian) on 12/18/22 at [place] List Status: <None>   Medication Order Taking? Sig Documenting Provider Last Dose Status Informant  cromolyn (OPTICROM) 4 % ophthalmic solution [id number]  Place 1 drop into both eyes 4 (four) times daily as needed. [name], MD  Active   EPIPEN 2-PAK 0.3 MG/0.3ML SOAJ injection [id number]  Inject 0.3 mg into the muscle as needed for anaphylaxis. [name], NP  Active   FLUoxetine (PROZAC) 10 MG capsule [id number]  Take 1 capsule (10 mg total) by mouth daily. [name], PA  Active   fluticasone (FLONASE) 50 MCG/ACT nasal spray [id number]  Place 2 sprays into both nostrils daily as needed for allergies or rhinitis. [name], MD  Active   lamoTRIgine (LAMICTAL) 25 MG tablet [id number] Yes Take 25 mg by mouth daily. [provider]  Active   levocetirizine (XYZAL) 5 MG tablet [id number]  Take 1 tablet (5 mg total) by mouth daily as needed. [name], MD  Active   lurasidone (LATUDA) 20 MG TABS tablet [id number] No Take by mouth.  Patient not taking: Reported on 12/18/2022   [provider] Not Taking Active   Multiple Vitamin (MULTIVITAMIN WITH MINERALS) TABS tablet [id number]  Take 1 tablet by mouth daily. [name], MD  Consider Medication Status and Discontinue   omeprazole (PRILOSEC OTC) 20 MG tablet [id number]  Take 20 mg by mouth 2 (two) times daily  before a meal. [provider]  Active   traZODone (DESYREL) 50 MG tablet [id number]  Take 1 tablet (50 mg total) by mouth at bedtime. [name], PA  Active   triamcinolone ointment (KENALOG) 0.1 % [id number]  Apply sparingly to  affected areas twice daily as needed below face and neck [name], MD  Active   VENTOLIN HFA 108 (90 Base) MCG/ACT inhaler [id number]  TAKE 2 PUFFS BY MOUTH EVERY 6 HOURS AS NEEDED FOR WHEEZE OR SHORTNESS OF BREATH [name], MD  Active   vitamin B-12 1000 MCG tablet [id number]  Take 1 tablet (1,000 mcg total) by mouth daily. [name], MD  Active   Vitamin D, Ergocalciferol, (DRISDOL) 1.25 MG (50000 UNIT) CAPS capsule [id number]  Take 50,000 Units by mouth every 7 (seven) days. 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   Encounter for insertion of mirena IUD - inserted 12/12/12 12/12/2012   SVD (spontaneous vaginal delivery) 09/23/2012   Chorioamnionitis 09/23/2012   Late prenatal care 08/27/2012   Normal pregnancy, first 08/11/2012   Anemia 09/17/2011    Conditions to be addressed/monitored per PCP order:  Depression  Care Plan : LCSW Plan of Care  Updates made by Greg Cutter, LCSW since 01/07/2023 12:00 AM     Problem: Depression Identification (Depression)      Long-Range Goal: Depressive Symptoms Identified   Start Date: 09/25/2022  Note:    Priority: High  Timeframe:  Long-Range Goal Priority:  High Start Date:   09/25/22            Expected End Date:  ongoing                     Follow Up Date--02/04/23 at 315 pm  - keep 90 percent of scheduled appointments -consider counseling or psychiatry -consider bumping up your self-care  -consider  creating a stronger support network   Why is this important?             Combatting depression may take some time.            If you don't feel better right away, don't give up on your treatment plan.    Current barriers:   Chronic Mental Health needs related to bipolar, depression and stress. Patient requires Support, Education, Resources, Referrals, Advocacy, and Care Coordination, in order to meet Unmet Mental Health Needs. Patient will implement clinical interventions discussed today to decrease symptoms of depression and increase knowledge and/or ability of: coping skills. Mental Health Concerns and Social Isolation Patient lacks knowledge of available community counseling agencies and resources. Lack of a stable support network  Clinical Goal(s): verbalize understanding of plan for management of Bipolar, Depression, and Stress and demonstrate a reduction in symptoms. Patient will connect with a provider for ongoing mental health treatment, increase coping skills, healthy habits, self-management skills, and stress reduction        Clinical Interventions:  Assessed  patient's previous and current treatment, coping skills, support system and barriers to care. Patient provided hx  Verbalization of feelings encouraged, motivational interviewing employed Emotional support provided, positive coping strategies explored. Establishing healthy boundaries to use with family emphasized and healthy self-care education provided. Patient has had issues in the past with family interfering with her mental health care.  Patient was educated on available mental health resources within their area that accept Medicaid and offer counseling and psychiatry. Email sent to patient today with instructions for scheduling at [facility] as well as some crisis support resources and [facility]'s walk in clinic hours Patient reports that she wishes to change her mental health providers from [facility] to [facility]. Patient reports  that her niece is a strong and stable support for her.  Patient is agreeable to referral to [facility] for counseling and psychiatry. [facility] LCSW made referral on 09/25/22. LCSW provided education on relaxation techniques such as meditation, deep breathing, massage, grounding exercises or yoga that can activate the body's relaxation response and ease symptoms of stress and anxiety. LCSW ask that when pt is struggling with difficult emotions and racing thoughts that they start this relaxation response process. LCSW provided extensive education on healthy coping skills for anxiety. SW used active and reflective listening, validated patient's feelings/concerns, and provided emotional support. Patient will work on implementing appropriate self-care habits into their daily routine such as: staying positive, writing a gratitude list, drinking water, staying active around the house, taking their medications as prescribed, combating negative thoughts or emotions and staying connected with their family and friends. Positive reinforcement provided for this decision to work on this. Past update- Patient reports that her PCP made a new referral to [facility] and they contacted her but she is not sure if her insurance is in their network and will be contacting her insurance provider to inquire. Patient reports that she has started group therapy at the [facility] and also an [place] at [employer]. She also signed up for a work training program at [employer] that will provide two weeks of hands on training before setting her up with new employment. Patient also has her initial intake appointment set up with [facility] that [facility] LCSW referred her to but this appointment isn't until next month. 10/25/22 UPDATE- [facility] LCSW talked to patient while she was in the waiting room at [facility] to see a psychiatrist. She already has completed her initial counseling session there and reports feeling comfortable with  her current mental health care team. [facility] LCSW will follow up later this month. 11/30/22 UPDATE- Patient reports that she wishes to continue psychiatry services at [facility] and continue counseling at [facility] with counselor Arby Barrette. She reports that she is on a new medication that she is experiencing crying spells as a side effect. Patient was advised to contact her psychiatrist before her appointment next week to update her provider on this negative side effects. Crisis resource education provided. LCSW 12/11/22 update- Patient reports that she did NOT stop the medication that her psychiatrist prescribed that were causing her adverse side effects but that she did call and schedule for a follow up appointment which is tomorrow. This appointment is virtual. She reports that she has decided to pursue counseling elsewhere after talking with her counseling at [facility] and a recommendation was made for her to follow up with. She reports that this recommendation provides specialized therapy services that she is interested in but she has not taken the time to reach  out to this resource yet but will do so this week. [facility] LCSW will follow up next month. [facility] LCSW 12/26/22 Update- Patient reports that she has an upcoming psychiatry appointment next week but sent an email to her psychiatrist asking for an earlier appointment due to experiencing ongoing adverse side effects of her current medication that the psychiatrist prescribed. Patient reports that she is currently getting enrolled with [facility] in order to start EMDR therapy. Positive reinforcement provided for her ongoing active self-care implementation.  Patient reports that they experience both anxiety and depression.She shares that he feels alone most days. She was receptive to anxiety and depression management coping skill education. UPDATE- Patient reports that her main stressor at this time is financially related. She reports that financial strain  lately has made her unable to get out of the bed. Referral made for [facility] for financial support and resource connection/education. The following coping skill education was provided for stress relief and mental health management: "When your car dies or a deadline looms, how do you respond? Long-term, low-grade or acute stress takes a serious toll on your body and mind, so don't ignore feelings of constant tension. Stress is a natural part of life. However, too much stress can harm our health, especially if it continues every day. This is chronic stress and can put you at risk for heart problems like heart disease and depression. Understand what's happening inside your body and learn simple coping skills to combat the negative impacts of everyday stressors.  Types of Stress There are two types of stress: Emotional - types of emotional stress are relationship problems, pressure at work, financial worries, experiencing discrimination or having a major life change. Physical - Examples of physical stress include being sick having pain, not sleeping well, recovery from an injury or having an alcohol and drug use disorder. Fight or Flight Sudden or ongoing stress activates your nervous system and floods your bloodstream with adrenaline and cortisol, two hormones that raise blood pressure, increase heart rate and spike blood sugar. These changes pitch your body into a fight or flight response. That enabled our ancestors to outrun saber-toothed tigers, and it's helpful today for situations like dodging a car accident. But most modern chronic stressors, such as finances or a challenging relationship, keep your body in that heightened state, which hurts your health. Effects of Too Much Stress If constantly under stress, most of [place] will eventually start to function less well.  Multiple studies link chronic stress to a higher risk of heart disease, stroke, depression, weight gain, memory loss and even premature  death, so it's important to recognize the warning signals. Talk to your doctor about ways to manage stress if you're experiencing any of these symptoms: Prolonged periods of poor sleep. Regular, severe headaches. Unexplained weight loss or gain. Feelings of isolation, withdrawal or worthlessness. Constant anger and irritability. Loss of interest in activities. Constant worrying or obsessive thinking. Excessive alcohol or drug use. Inability to concentrate.  10 Ways to Cope with Chronic Stress It's key to recognize stressful situations as they occur because it allows you to focus on managing how you react. We all need to know when to close our eyes and take a deep breath when we feel tension rising. Use these tips to prevent or reduce chronic stress. 1. Rebalance Work and Home All work and no play? If you're spending too much time at the office, intentionally put more dates in your calendar to enjoy time for fun,  either alone or with others. 2. Get Regular Exercise Moving your body on a regular basis balances the nervous system and increases blood circulation, helping to flush out stress hormones. Even a daily 20-minute walk makes a difference. Any kind of exercise can lower stress and improve your mood ? just pick activities that you enjoy and make it a regular habit. 3. Eat Well and Limit Alcohol and Stimulants Alcohol, nicotine and caffeine may temporarily relieve stress but have negative health impacts and can make stress worse in the long run. Well-nourished bodies cope better, so start with a good breakfast, add more organic fruits and vegetables for a well-balanced diet, avoid processed foods and sugar, try herbal tea and drink more water. 4. Connect with Supportive People Talking face to face with another person releases hormones that reduce stress. Lean on those good listeners in your life. 5. [place] Time Do you enjoy gardening, reading, listening to music or some other  creative pursuit? Engage in activities that bring you pleasure and joy; research shows that reduces stress by almost half and lowers your heart rate, too. 6. Practice Meditation, Stress Reduction or Yoga Relaxation techniques activate a state of restfulness that counterbalances your body's fight-or-flight hormones. Even if this also means a 10-minute break in a long day: listen to music, read, go for a walk in nature, do a hobby, take a bath or spend time with a friend. Also consider doing a mindfulness exercise or try a daily deep breathing or imagery practice. Deep Breathing Slow, calm and deep breathing can help you relax. Try these steps to focus on your breathing and repeat as needed. Find a comfortable position and close your eyes. Exhale and drop your shoulders. Breathe in through your nose; fill your lungs and then your belly. Think of relaxing your body, quieting your mind and becoming calm and peaceful. Breathe out slowly through your nose, relaxing your belly. Think of releasing tension, pain, worries or distress. Repeat steps three and four until you feel relaxed. Imagery This involves using your mind to excite the senses -- sound, vision, smell, taste and feeling. This may help ease your stress. Begin by getting comfortable and then do some slow breathing. Imagine a place you love being at. It could be somewhere from your childhood, somewhere you vacationed or just a place in your imagination. Feel how it is to be in the place you're imagining. Pay attention to the sounds, air, colors, and who is there with you. This is a place where you feel cared for and loved. All is well. You are safe. Take in all the smells, sounds, tastes and feelings. As you do, feel your body being nourished and healed. Feel the calm that surrounds you. Breathe in all the good. Breathe out any discomfort or tension. 7. Sleep Enough If you get less than seven to eight hours of sleep, your body won't tolerate  stress as well as it could. If stress keeps you up at night, address the cause, and add extra meditation into your day to make up for the lost z's. Try to get seven to nine hours of sleep each night. Make a regular bedtime schedule. Keep your room dark and cool. Try to avoid computers, TV, cell phones and tablets before bed. 8. Bond with Connections You Enjoy Go out for a coffee with a friend, chat with a neighbor, call a family member, visit with a clergy member, or even hang out with your pet. Clinical studies show  that spending even a short time with a companion animal can cut anxiety levels almost in half. 9. Take a Vacation Getting away from it all can reset your stress tolerance by increasing your mental and emotional outlook, which makes you a happier, more productive person upon return. Leave your cellphone and laptop at home! 10. See a Counselor, Coach or Therapist If negative thoughts overwhelm your ability to make positive changes, it's time to seek professional help. Make an appointment today--your health and life are worth it."   Depression screen reviewed  PHQ2/ PHQ9 completed or reviewed  Mindfulness or Relaxation training provided Active listening / Reflection utilized  Advance Care and HCPOA education provided Emotional Support Provided Problem [place] strategies reviewed Provided psychoeducation for mental health needs  Provided brief CBT  Reviewed mental health medications and discussed importance of compliance:  Quality of sleep assessed & Sleep Hygiene techniques promoted  Participation in counseling encouraged  Verbalization of feelings encouraged  Suicidal Ideation/Homicidal Ideation assessed: Patient denies SI/HI  Review resources, discussed options and provided patient information about  [place] care team collaboration (see longitudinal plan of care)  Patient Goals/Self-Care Activities: Over the next 120 days Attend  scheduled medical appointments Utilize healthy coping skills and supportive resources discussed Contact PCP with any questions or concerns Keep 90 percent of counseling appointments Call your insurance provider for more information about your Enhanced Benefits  Check out counseling resources provided  Begin personal counseling with LCSW, to reduce and manage symptoms of Depression and Stress, until well-established with mental health provider Accept all calls from [facility] with [facility] in an effort to establish ongoing mental health counseling and supportive services. Incorporate into daily practice - relaxation techniques, deep breathing exercises, and mindfulness meditation strategies. Talk about feelings with friends, family members, spiritual advisor, etc. Contact LCSW directly [phone]), if you have questions, need assistance, or if additional social work needs are identified between now and our next scheduled telephone outreach call. Call 988 for mental health hotline/crisis line if needed (24/7 available) Try techniques to reduce symptoms of anxiety/negative thinking (deep breathing, distraction, positive self talk, etc)  - develop a personal safety plan - develop a plan to deal with triggers like holidays, anniversaries - exercise at least 2 to 3 times per week - have a plan for how to handle bad days - journal feelings and what helps to feel better or worse - spend time or talk with others at least 2 to 3 times per week - watch for early signs of feeling worse - begin personal counseling - call and visit an old friend - check out volunteer opportunities - join a support group - laugh; watch a funny movie or comedian - learn and use visualization or guided imagery - perform a random act of kindness - practice relaxation or meditation daily - start or continue a personal journal - practice positive thinking and self-talk -continue with compliance of taking medication   -identify current effective and ineffective coping strategies.  -implement positive self-talk in care to increase self-esteem, confidence and feelings of control.  -consider alternative and complementary therapy approaches such as meditation, mindfulness or yoga.  -journaling, prayer, worship services, meditation or pastoral counseling.  -increase participation in pleasurable group activities such as hobbies, singing, sports or volunteering).  -consider the use of meditative movement therapy such as tai chi, yoga or qigong.  -start a regular daily exercise program based on tolerance, ability and patient choice to support positive thinking and  activity    If you are experiencing a Mental Health or [place] or need someone to talk to, please call the Suicide and Crisis Lifeline: 988    Patient Goals: Follow up goal      Follow up:  Patient agrees to Care Plan and Follow-up.  Plan: The Managed Medicaid care management team will reach out to the patient again over the next 30 days.  Date/time of next scheduled Social Work care management/care coordination outreach:  02/06/23 at 315 pm  Eula Fried, [place], MSW, [place] Medicaid LCSW [email or web address] Phone: [phone]

## 2023-01-15 ENCOUNTER — Other Ambulatory Visit: Payer: Medicaid Other

## 2023-01-15 NOTE — Patient Instructions (Signed)
Visit Information  Kaitlyn Crane was given information about Medicaid Managed Care team care coordination services as a part of their [place] Medicaid benefit. Kaitlyn Crane verbally consented to engagement with the [facility] Managed Care team.   If you are experiencing a medical emergency, please call 911 or report to your local emergency department or urgent care.   If you have a non-emergency medical problem during routine business hours, please contact your provider's office and ask to speak with a nurse.   For questions related to your [facility], please call: [phone] or visit the homepage here: [email or web address]  If you would like to schedule transportation through your [facility], please call the following number at least 2 days in advance of your appointment: [phone]   Rides for urgent appointments can also be made after hours by calling Member Services.  Call the [place] at [phone], at any time, 24 hours a day, 7 days a week. If you are in danger or need immediate medical attention call 911.  If you would like help to quit smoking, call 1-800-QUIT-NOW 779 560 1192) OR Espaol: 1-855-Djelo-Ya HD:1601594) o para ms informacin haga clic aqu or Text READY to 200-400 to register via text  Kaitlyn Crane - following are the goals we discussed in your visit today:   Goals Addressed   None     Social Worker will follow up in 30 days.   Mickel Fuchs, Arita Miss, MHA [facility]  Managed Medicaid Social Worker [phone]   Following is a copy of your plan of care:  There are no care plans that you recently modified to display for this patient.

## 2023-01-15 NOTE — Patient Outreach (Signed)
Medicaid Managed Care Social Work Note  01/15/2023 Name:  Laquandria Gannaway MRN:  [id number] DOB:  24-Jun-1996  Keylin Tates is an 27 y.o. year old female who is a primary patient of [name], [place].  The [facility] Managed Care Coordination team was consulted for assistance with:   credit counseling  Ms. Rosengrant was given information about Medicaid Managed Care Coordination team services today. Gillie Maute Patient agreed to services and verbal consent obtained.  Engaged with patient  for by telephone forinitial visit in response to referral for case management and/or care coordination services.   Assessments/Interventions:  Review of past medical history, allergies, medications, health status, including review of consultants reports, laboratory and other test data, was performed as part of comprehensive evaluation and provision of chronic care management services.  SDOH: (Social Determinant of Health) assessments and interventions performed: SDOH Interventions    Flowsheet Row Patient Outreach Telephone from 01/07/2023 in [place] Patient Outreach Telephone from 12/26/2022 in [place] Patient Outreach Telephone from 12/11/2022 in [place] Patient Outreach Telephone from 11/30/2022 in [place] Patient Outreach Telephone from 10/10/2022 in [place] Patient Outreach Telephone from 09/25/2022 in [place]  SDOH Interventions        Depression Interventions/Treatment  -- -- -- -- -- Counseling, Currently on Treatment  Stress Interventions [place], Provide Counseling     BSW completd a telephone outreach with patient. She stated she and her son live with her parents. 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 She is wanting financial resources such as credit counseling and budgeting to start getting her credit together. BSW provided patient with the telephone number for credit counseling services from [place] as well as sent her the brochure to [email or web address] [email or web address]. No other resources are needed at this time.   Advanced Directives Status:  Not addressed in this encounter.  Care Plan                 Allergies  Allergen Reactions   Shellfish Allergy Other (See Comments)    Swelling of tonsils   Carbamide Peroxide Rash   Nickel Sulfate [Nickel] Rash    Medications Reviewed Today     Reviewed by Ardelle Lesches, RD (Registered Dietitian) on 12/18/22 at [place] List Status: <None>   Medication Order Taking? Sig Documenting Provider Last Dose Status Informant  cromolyn (OPTICROM) 4 % ophthalmic solution [id number]  Place 1 drop into both eyes 4 (four) times daily as needed. [name], MD  Active   EPIPEN 2-PAK 0.3 MG/0.3ML SOAJ injection [id number]  Inject 0.3 mg into the muscle as needed for anaphylaxis. [name], NP  Active   FLUoxetine (PROZAC) 10 MG capsule [id number]  Take 1 capsule (10 mg total) by mouth daily. [name], PA  Active   fluticasone (FLONASE) 50 MCG/ACT nasal spray [id number]  Place 2 sprays into both nostrils daily as needed for allergies or rhinitis. [name], MD  Active   lamoTRIgine (LAMICTAL) 25 MG tablet [id number] Yes Take 25 mg by mouth daily. [provider]  Active   levocetirizine (XYZAL) 5 MG tablet [id number]  Take  1 tablet (5 mg total) by mouth daily as needed. [name], MD  Active   lurasidone (LATUDA) 20 MG TABS tablet [id number] No Take by mouth.  Patient  not taking: Reported on 12/18/2022   [provider] Not Taking Active   Multiple Vitamin (MULTIVITAMIN WITH MINERALS) TABS tablet [id number]  Take 1 tablet by mouth daily. [name], MD  Consider Medication Status and Discontinue   omeprazole (PRILOSEC OTC) 20 MG tablet [id number]  Take 20 mg by mouth 2 (two) times daily before a meal. [provider]  Active   traZODone (DESYREL) 50 MG tablet [id number]  Take 1 tablet (50 mg total) by mouth at bedtime. [name], PA  Active   triamcinolone ointment (KENALOG) 0.1 % [id number]  Apply sparingly to affected areas twice daily as needed below face and neck [name], MD  Active   VENTOLIN HFA 108 (90 Base) MCG/ACT inhaler [id number]  TAKE 2 PUFFS BY MOUTH EVERY 6 HOURS AS NEEDED FOR WHEEZE OR SHORTNESS OF BREATH [name], MD  Active   vitamin B-12 1000 MCG tablet [id number]  Take 1 tablet (1,000 mcg total) by mouth daily. [name], MD  Active   Vitamin D, Ergocalciferol, (DRISDOL) 1.25 MG (50000 UNIT) CAPS capsule [id number]  Take 50,000 Units by mouth every 7 (seven) days. 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   Follow up:  Patient agrees to Care Plan and Follow-up.  Plan: The Managed Medicaid care management team will reach out to the patient again over the next 30 days.  Date/time of next scheduled Social Work care management/care coordination outreach:  02/14/2023  Mickel Fuchs, Arita Miss, [facility] Social Worker [phone]

## 2023-01-16 DIAGNOSIS — F3132 Bipolar disorder, current episode depressed, moderate: Secondary | ICD-10-CM | POA: Diagnosis not present

## 2023-01-18 ENCOUNTER — Ambulatory Visit (INDEPENDENT_AMBULATORY_CARE_PROVIDER_SITE_OTHER): Payer: Medicaid Other | Admitting: Clinical

## 2023-01-18 DIAGNOSIS — F331 Major depressive disorder, recurrent, moderate: Secondary | ICD-10-CM | POA: Diagnosis not present
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
## 2023-01-30 ENCOUNTER — Ambulatory Visit: Payer: Medicaid Other | Admitting: Registered"

## 2023-02-04 ENCOUNTER — Other Ambulatory Visit: Payer: Medicaid Other | Admitting: Licensed Clinical Social Worker

## 2023-02-04 DIAGNOSIS — K219 Gastro-esophageal reflux disease without esophagitis: Secondary | ICD-10-CM | POA: Diagnosis not present

## 2023-02-04 DIAGNOSIS — J309 Allergic rhinitis, unspecified: Secondary | ICD-10-CM | POA: Diagnosis not present

## 2023-02-04 NOTE — Patient Outreach (Signed)
  Medicaid Managed Care   Unsuccessful Attempt Note   02/04/2023 Name: Kaitlyn Crane MRN: [id number] DOB: 03/08/1996  Referred by: Olive Bass, FNP Reason for referral : No chief complaint on file.   [facility] LCSW completed two outreaches for scheduled [facility] LCSW follow up appointment on 02/04/23 and was able to reach patient but she states she is currently in another medical appointment and will needed to reschedule. Appointment successfully rescheduled for next week and email was sent to patient.  Follow Up Plan: The Managed Medicaid care management team will reach out to the patient again over the next 30 days.   Dickie La, BSW, MSW, [employer] Managed Medicaid LCSW [facility]  [facility] [email or web address] Phone: [phone]

## 2023-02-06 DIAGNOSIS — F3132 Bipolar disorder, current episode depressed, moderate: Secondary | ICD-10-CM | POA: Diagnosis not present

## 2023-02-12 ENCOUNTER — Other Ambulatory Visit: Payer: Medicaid Other | Admitting: Licensed Clinical Social Worker

## 2023-02-12 NOTE — Patient Instructions (Signed)
Visit Information  Kaitlyn Crane was given information about Medicaid Managed Care team care coordination services as a part of their [facility] Community Plan Medicaid benefit. Kaitlyn Crane verbally consented to engagement with the [facility] Managed Care team.   If you are experiencing a medical emergency, please call 911 or report to your local emergency department or urgent care.   If you have a non-emergency medical problem during routine business hours, please contact your provider's office and ask to speak with a nurse.   For questions related to your [facility], please call: [phone] or visit the homepage here: [email or web address]  If you would like to schedule transportation through your [facility], please call the following number at least 2 days in advance of your appointment: [phone]   Rides for urgent appointments can also be made after hours by calling Member Services.  Call the Behavioral Health Crisis Line at [phone], at any time, 24 hours a day, 7 days a week. If you are in danger or need immediate medical attention call 911.  If you would like help to quit smoking, call 1-800-QUIT-NOW ((505)732-1886) OR Espaol: 1-855-Djelo-Ya (1-324-401-0272) o para ms informacin haga clic aqu or Text READY to 536-644 to register via text   Following is a copy of your plan of care:  Care Plan : LCSW Plan of Care  Updates made by Gustavus Bryant, LCSW since 02/12/2023 12:00 AM     Problem: Depression Identification (Depression)      Long-Range Goal: Depressive Symptoms Identified   Start Date: 09/25/2022  Note:    Priority: High  Timeframe:  Long-Range Goal Priority:  High Start Date:   09/25/22            Expected End Date:  ongoing                     Follow Up Date--02/26/23 at 245   - keep 90 percent of scheduled appointments -consider counseling or  psychiatry -consider bumping up your self-care  -consider creating a stronger support network   Why is this important?             Combatting depression may take some time.            If you don't feel better right away, don't give up on your treatment plan.    Current barriers:   Chronic Mental Health needs related to bipolar, depression and stress. Patient requires Support, Education, Resources, Referrals, Advocacy, and Care Coordination, in order to meet Unmet Mental Health Needs. Patient will implement clinical interventions discussed today to decrease symptoms of depression and increase knowledge and/or ability of: coping skills. Mental Health Concerns and Social Isolation Patient lacks knowledge of available community counseling agencies and resources. Lack of a stable support network  Clinical Goal(s): verbalize understanding of plan for management of Bipolar, Depression, and Stress and demonstrate a reduction in symptoms. Patient will connect with a provider for ongoing mental health treatment, increase coping skills, healthy habits, self-management skills, and stress reduction        Types of Stress There are two types of stress: Emotional - types of emotional stress are relationship problems, pressure at work, financial worries, experiencing discrimination or having a major life change. Physical - Examples of physical stress include being sick having pain, not sleeping well, recovery from an injury or having an alcohol and drug use disorder. Fight or Flight Sudden or ongoing stress activates your nervous  system and floods your bloodstream with adrenaline and cortisol, two hormones that raise blood pressure, increase heart rate and spike blood sugar. These changes pitch your body into a fight or flight response. That enabled our ancestors to outrun saber-toothed tigers, and it's helpful today for situations like dodging a car accident. But most modern chronic stressors, such as  finances or a challenging relationship, keep your body in that heightened state, which hurts your health. Effects of Too Much Stress If constantly under stress, most of [place] will eventually start to function less well.  Multiple studies link chronic stress to a higher risk of heart disease, stroke, depression, weight gain, memory loss and even premature death, so it's important to recognize the warning signals. Talk to your doctor about ways to manage stress if you're experiencing any of these symptoms: Prolonged periods of poor sleep. Regular, severe headaches. Unexplained weight loss or gain. Feelings of isolation, withdrawal or worthlessness. Constant anger and irritability. Loss of interest in activities. Constant worrying or obsessive thinking. Excessive alcohol or drug use. Inability to concentrate.  10 Ways to Cope with Chronic Stress It's key to recognize stressful situations as they occur because it allows you to focus on managing how you react. We all need to know when to close our eyes and take a deep breath when we feel tension rising. Use these tips to prevent or reduce chronic stress. 1. Rebalance Work and Home All work and no play? If you're spending too much time at the office, intentionally put more dates in your calendar to enjoy time for fun, either alone or with others. 2. Get Regular Exercise Moving your body on a regular basis balances the nervous system and increases blood circulation, helping to flush out stress hormones. Even a daily 20-minute walk makes a difference. Any kind of exercise can lower stress and improve your mood ? just pick activities that you enjoy and make it a regular habit. 3. Eat Well and Limit Alcohol and Stimulants Alcohol, nicotine and caffeine may temporarily relieve stress but have negative health impacts and can make stress worse in the long run. Well-nourished bodies cope better, so start with a good breakfast, add more organic fruits and  vegetables for a well-balanced diet, avoid processed foods and sugar, try herbal tea and drink more water. 4. Connect with Supportive People Talking face to face with another person releases hormones that reduce stress. Lean on those good listeners in your life. 5. [place] Time Do you enjoy gardening, reading, listening to music or some other creative pursuit? Engage in activities that bring you pleasure and joy; research shows that reduces stress by almost half and lowers your heart rate, too. 6. Practice Meditation, Stress Reduction or Yoga Relaxation techniques activate a state of restfulness that counterbalances your body's fight-or-flight hormones. Even if this also means a 10-minute break in a long day: listen to music, read, go for a walk in nature, do a hobby, take a bath or spend time with a friend. Also consider doing a mindfulness exercise or try a daily deep breathing or imagery practice. Deep Breathing Slow, calm and deep breathing can help you relax. Try these steps to focus on your breathing and repeat as needed. Find a comfortable position and close your eyes. Exhale and drop your shoulders. Breathe in through your nose; fill your lungs and then your belly. Think of relaxing your body, quieting your mind and becoming calm and peaceful. Breathe out slowly through your nose, relaxing your  belly. Think of releasing tension, pain, worries or distress. Repeat steps three and four until you feel relaxed. Imagery This involves using your mind to excite the senses -- sound, vision, smell, taste and feeling. This may help ease your stress. Begin by getting comfortable and then do some slow breathing. Imagine a place you love being at. It could be somewhere from your childhood, somewhere you vacationed or just a place in your imagination. Feel how it is to be in the place you're imagining. Pay attention to the sounds, air, colors, and who is there with you. This is a place where you  feel cared for and loved. All is well. You are safe. Take in all the smells, sounds, tastes and feelings. As you do, feel your body being nourished and healed. Feel the calm that surrounds you. Breathe in all the good. Breathe out any discomfort or tension. 7. Sleep Enough If you get less than seven to eight hours of sleep, your body won't tolerate stress as well as it could. If stress keeps you up at night, address the cause, and add extra meditation into your day to make up for the lost z's. Try to get seven to nine hours of sleep each night. Make a regular bedtime schedule. Keep your room dark and cool. Try to avoid computers, TV, cell phones and tablets before bed. 8. Bond with Connections You Enjoy Go out for a coffee with a friend, chat with a neighbor, call a family member, visit with a clergy member, or even hang out with your pet. Clinical studies show that spending even a short time with a companion animal can cut anxiety levels almost in half. 9. Take a Vacation Getting away from it all can reset your stress tolerance by increasing your mental and emotional outlook, which makes you a happier, more productive person upon return. Leave your cellphone and laptop at home! 10. See a Counselor, Coach or Therapist If negative thoughts overwhelm your ability to make positive changes, it's time to seek professional help. Make an appointment today--your health and life are worth it."   Depression screen reviewed  PHQ2/ PHQ9 completed or reviewed  Mindfulness or Relaxation training provided Active listening / Reflection utilized  Advance Care and HCPOA education provided Emotional Support Provided Problem [place] strategies reviewed Provided psychoeducation for mental health needs  Provided brief CBT  Reviewed mental health medications and discussed importance of compliance:  Quality of sleep assessed & Sleep Hygiene techniques promoted  Participation in counseling encouraged   Verbalization of feelings encouraged  Suicidal Ideation/Homicidal Ideation assessed: Patient denies SI/HI  Review resources, discussed options and provided patient information about  [place] care team collaboration (see longitudinal plan of care)  Patient Goals/Self-Care Activities: Over the next 120 days Attend scheduled medical appointments Utilize healthy coping skills and supportive resources discussed Contact PCP with any questions or concerns Keep 90 percent of counseling appointments Call your insurance provider for more information about your Enhanced Benefits  Check out counseling resources provided  Begin personal counseling with LCSW, to reduce and manage symptoms of Depression and Stress, until well-established with mental health provider Accept all calls from representative with [facility] in an effort to establish ongoing mental health counseling and supportive services. Incorporate into daily practice - relaxation techniques, deep breathing exercises, and mindfulness meditation strategies. Talk about feelings with friends, family members, spiritual advisor, etc. Contact LCSW directly [phone]), if you have questions, need assistance, or if additional social work needs  are identified between now and our next scheduled telephone outreach call. Call 988 for mental health hotline/crisis line if needed (24/7 available) Try techniques to reduce symptoms of anxiety/negative thinking (deep breathing, distraction, positive self talk, etc)  - develop a personal safety plan - develop a plan to deal with triggers like holidays, anniversaries - exercise at least 2 to 3 times per week - have a plan for how to handle bad days - journal feelings and what helps to feel better or worse - spend time or talk with others at least 2 to 3 times per week - watch for early signs of feeling worse - begin personal counseling - call and visit an old friend -  check out volunteer opportunities - join a support group - laugh; watch a funny movie or comedian - learn and use visualization or guided imagery - perform a random act of kindness - practice relaxation or meditation daily - start or continue a personal journal - practice positive thinking and self-talk -continue with compliance of taking medication  -identify current effective and ineffective coping strategies.  -implement positive self-talk in care to increase self-esteem, confidence and feelings of control.  -consider alternative and complementary therapy approaches such as meditation, mindfulness or yoga.  -journaling, prayer, worship services, meditation or pastoral counseling.  -increase participation in pleasurable group activities such as hobbies, singing, sports or volunteering).  -consider the use of meditative movement therapy such as tai chi, yoga or qigong.  -start a regular daily exercise program based on tolerance, ability and patient choice to support positive thinking and activity    If you are experiencing a Mental Health or [place] or need someone to talk to, please call the Suicide and Crisis Lifeline: [phone]    Patient Goals: Follow up goal

## 2023-02-12 NOTE — Patient Outreach (Addendum)
Medicaid Managed Care Social Work Note  02/12/2023 Name:  Kaitlyn Crane MRN:  [id number] DOB:  1996-06-24  Kaitlyn Crane is an 27 y.o. year old female who is a primary patient of Olive Bass, FNP.  The Medicaid Managed Care Coordination team was consulted for assistance with:  Mental Health Counseling and Resources  Kaitlyn Crane was given information about Medicaid Managed Care Coordination team services today. Kaitlyn Crane Patient agreed to services and verbal consent obtained.  Engaged with patient  for by telephone forfollow up visit in response to referral for case management and/or care coordination services.   Assessments/Interventions:  Review of past medical history, allergies, medications, health status, including review of consultants reports, laboratory and other test data, was performed as part of comprehensive evaluation and provision of chronic care management services.  SDOH: (Social Determinant of Health) assessments and interventions performed: SDOH Interventions    Flowsheet Row Patient Outreach Telephone from 02/12/2023 in [place] POPULATION HEALTH DEPARTMENT Patient Outreach Telephone from 01/07/2023 in [place] POPULATION HEALTH DEPARTMENT Patient Outreach Telephone from 12/26/2022 in [place] POPULATION HEALTH DEPARTMENT Patient Outreach Telephone from 12/11/2022 in [place] POPULATION HEALTH DEPARTMENT Patient Outreach Telephone from 11/30/2022 in [place] POPULATION HEALTH DEPARTMENT Office Visit from 10/24/2022 in [facility]  SDOH Interventions        Depression Interventions/Treatment  -- -- -- -- -- Medication, Counseling  Stress Interventions Offered [employer], Provide Counseling Offered [employer] Resources, Provide Counseling Offered [facility] Resources, Provide Counseling Offered Community Well[facility] Resources, Provide Counseling Offered [employer] Resources, Provide Counseling --        Advanced Directives Status:  See Care Plan for related entries.  Care Plan                 Allergies  Allergen Reactions   Shellfish Allergy Other (See Comments)    Swelling of tonsils   Carbamide Peroxide Rash   Nickel Sulfate [Nickel] Rash    Medications Reviewed Today     Reviewed by Lyda Perone, RD (Registered Dietitian) on 12/18/22 at 1406  Med List Status: <None>   Medication Order Taking? Sig Documenting Provider Last Dose Status Informant  cromolyn (OPTICROM) 4 % ophthalmic solution [id number]  Place 1 drop into both eyes 4 (four) times daily as needed. Marcelyn Bruins, MD  Active   EPIPEN 2-PAK 0.3 MG/0.3ML SOAJ injection [id number]  Inject 0.3 mg into the muscle as needed for anaphylaxis. Clayborne Dana, NP  Active   FLUoxetine (PROZAC) 10 MG capsule [id number]  Take 1 capsule (10 mg total) by mouth daily. Meta Hatchet, PA  Active   fluticasone (FLONASE) 50 MCG/ACT nasal spray [id number]  Place 2 sprays into both nostrils daily as needed for allergies or rhinitis. Marcelyn Bruins, MD  Active   lamoTRIgine (LAMICTAL) 25 MG tablet [id number] Yes Take 25 mg by mouth daily. [provider]  Active   levocetirizine (XYZAL) 5 MG tablet [id number]  Take 1 tablet (5 mg total) by mouth daily as needed. Marcelyn Bruins, MD  Active   lurasidone (LATUDA) 20 MG TABS tablet [id number] No Take by mouth.  Patient not taking: Reported on 12/18/2022   [provider] Not Taking Active   Multiple Vitamin (MULTIVITAMIN WITH MINERALS) TABS tablet [id number]  Take 1 tablet by mouth daily. Karsten Ro, MD  Consider Medication Status and Discontinue   omeprazole (PRILOSEC OTC) 20 MG tablet [id number]  Take 20 mg by mouth 2 (  two) times daily before a meal. [provider]  Active   traZODone (DESYREL) 50 MG tablet [id number]  Take 1 tablet (50 mg total) by mouth at bedtime. Meta Hatchet, PA  Active   triamcinolone ointment (KENALOG) 0.1  % [id number]  Apply sparingly to affected areas twice daily as needed below face and neck Marcelyn Bruins, MD  Active   VENTOLIN HFA 108 (90 Base) MCG/ACT inhaler [id number]  TAKE 2 PUFFS BY MOUTH EVERY 6 HOURS AS NEEDED FOR WHEEZE OR SHORTNESS OF BREATH Marcelyn Bruins, MD  Active   vitamin B-12 1000 MCG tablet [id number]  Take 1 tablet (1,000 mcg total) by mouth daily. Karsten Ro, MD  Active   Vitamin D, Ergocalciferol, (DRISDOL) 1.25 MG (50000 UNIT) CAPS capsule [id number]  Take 50,000 Units by mouth every 7 (seven) days. 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   Encounter for insertion of mirena IUD - inserted 12/12/12 12/12/2012   SVD (spontaneous vaginal delivery) 09/23/2012   Chorioamnionitis 09/23/2012   Late prenatal care 08/27/2012   Normal pregnancy, first 08/11/2012   Anemia 09/17/2011    Conditions to be addressed/monitored per PCP order:  Anxiety and Depression  Care Plan : LCSW Plan of Care  Updates made by Gustavus Bryant, LCSW since 02/12/2023 12:00 AM     Problem: Depression Identification (Depression)      Long-Range Goal: Depressive Symptoms Identified   Start Date: 09/25/2022  Note:    Priority: High  Timeframe:  Long-Range Goal Priority:  High Start Date:   09/25/22            Expected End Date:  ongoing                     Follow Up Date--02/26/23 at 60   - keep 90 percent of scheduled appointments -consider counseling or  psychiatry -consider bumping up your self-care  -consider creating a stronger support network   Why is this important?             Combatting depression may take some time.            If you don't feel better right away, don't give up on your treatment plan.    Current barriers:   Chronic Mental Health needs related to bipolar, depression and stress. Patient requires Support, Education, Resources, Referrals, Advocacy, and Care Coordination, in order to meet Unmet Mental Health Needs. Patient will implement clinical interventions discussed today to decrease symptoms of depression and increase knowledge and/or ability of: coping skills. Mental Health Concerns and Social Isolation Patient lacks knowledge of available community counseling agencies and resources. Lack of a stable support network  Clinical Goal(s): verbalize understanding of plan for management of Bipolar, Depression, and Stress and demonstrate a reduction in symptoms. Patient will connect with a provider for ongoing mental health treatment, increase coping skills, healthy habits, self-management skills, and stress reduction  Clinical Interventions:  Assessed patient's previous and current treatment, coping skills, support system and barriers to care. Patient provided hx  Verbalization of feelings encouraged, motivational interviewing employed Emotional support provided, positive coping strategies explored. Establishing healthy boundaries to use with family emphasized and healthy self-care education provided. Patient has had issues in the past with family interfering with her mental health care.  Patient was educated on available mental health resources within their area that accept Medicaid and offer counseling and psychiatry. Email sent to patient today with instructions for scheduling at [facility] as well as some crisis support resources and [facility]'s walk in clinic hours Patient reports that she wishes to change her mental  health providers from [facility] to [facility]. Patient reports that her niece is a strong and stable support for her.  Patient is agreeable to referral to [facility] for counseling and psychiatry. [facility] LCSW made referral on 09/25/22. LCSW provided education on relaxation techniques such as meditation, deep breathing, massage, grounding exercises or yoga that can activate the body's relaxation response and ease symptoms of stress and anxiety. LCSW ask that when pt is struggling with difficult emotions and racing thoughts that they start this relaxation response process. LCSW provided extensive education on healthy coping skills for anxiety. SW used active and reflective listening, validated patient's feelings/concerns, and provided emotional support. Patient will work on implementing appropriate self-care habits into their daily routine such as: staying positive, writing a gratitude list, drinking water, staying active around the house, taking their medications as prescribed, combating negative thoughts or emotions and staying connected with their family and friends. Positive reinforcement provided for this decision to work on this. Past update- Patient reports that her PCP made a new referral to [facility] and they contacted her but she is not sure if her insurance is in their network and will be contacting her insurance provider to inquire. Patient reports that she has started group therapy at the [facility] and also an Emotional Wellness Group at [employer]. She also signed up for a work training program at [employer] that will provide two weeks of hands on training before setting her up with new employment. Patient also has her initial intake appointment set up with [facility] that [facility] LCSW referred her to but this appointment isn't until next month. 10/25/22 UPDATE- [facility] LCSW talked to patient while she was in the waiting room at [facility] to see a psychiatrist. She already has completed her initial  counseling session there and reports feeling comfortable with her current mental health care team. [facility] LCSW will follow up later this month. 11/30/22 UPDATE- Patient reports that she wishes to continue psychiatry services at [facility] and continue counseling at [facility] with counselor Idalia Needle. She reports that she is on a new medication that she is experiencing crying spells as a side effect. Patient was advised to contact her psychiatrist before her appointment next week to update her provider on this negative side effects. Crisis resource education provided. LCSW 12/11/22 update- Patient reports that she did NOT stop the medication that her psychiatrist prescribed that were causing her adverse side effects but that she did call and schedule for a follow up appointment which is tomorrow. This appointment is virtual. She reports that she has decided to pursue counseling elsewhere after talking with her counseling at [facility] and a recommendation was made for her to follow up with. She reports that this recommendation provides specialized therapy services that she is interested in but she has not taken  the time to reach out to this resource yet but will do so this week. [facility] LCSW will follow up next month. [facility] LCSW 12/26/22 Update- Patient reports that she has an upcoming psychiatry appointment next week but sent an email to her psychiatrist asking for an earlier appointment due to experiencing ongoing adverse side effects of her current medication that the psychiatrist prescribed. Patient reports that she is currently getting enrolled with [facility] in order to start EMDR therapy. Positive reinforcement provided for her ongoing active self-care implementation.  Patient reports that they experience both anxiety and depression.She shares that he feels alone most days. She was receptive to anxiety and depression management coping skill education. UPDATE- Patient reports that her main stressor at this time  is financially related. She reports that financial strain lately has made her unable to get out of the bed. Referral made for [facility] BSW for financial support and resource connection/education. 02/12/23 LCSW update- Patient reports need for pharmacy assistance and services other than what her pharmacy can provide to her regarding her journey with finding the right medication and medication combination for her.  Patient had a really bad skin reaction to lamictal and is now on latuda and her psychiatrist is now suggesting wellbutrin and has ordered this medication to assist with patient's depression. [facility] LCSW will ask [facility] team regarding need for specific pharmacy assistance/education and support services regarding side effect of medications. Emotional support, self-care and coping tool education provided. The following coping skill education was provided for stress relief and mental health management: "When your car dies or a deadline looms, how do you respond? Long-term, low-grade or acute stress takes a serious toll on your body and mind, so don't ignore feelings of constant tension. Stress is a natural part of life. However, too much stress can harm our health, especially if it continues every day. This is chronic stress and can put you at risk for heart problems like heart disease and depression. Understand what's happening inside your body and learn simple coping skills to combat the negative impacts of everyday stressors.  Types of Stress There are two types of stress: Emotional - types of emotional stress are relationship problems, pressure at work, financial worries, experiencing discrimination or having a major life change. Physical - Examples of physical stress include being sick having pain, not sleeping well, recovery from an injury or having an alcohol and drug use disorder. Fight or Flight Sudden or ongoing stress activates your nervous system and floods your bloodstream with adrenaline and  cortisol, two hormones that raise blood pressure, increase heart rate and spike blood sugar. These changes pitch your body into a fight or flight response. That enabled our ancestors to outrun saber-toothed tigers, and it's helpful today for situations like dodging a car accident. But most modern chronic stressors, such as finances or a challenging relationship, keep your body in that heightened state, which hurts your health. Effects of Too Much Stress If constantly under stress, most of [place] will eventually start to function less well.  Multiple studies link chronic stress to a higher risk of heart disease, stroke, depression, weight gain, memory loss and even premature death, so it's important to recognize the warning signals. Talk to your doctor about ways to manage stress if you're experiencing any of these symptoms: Prolonged periods of poor sleep. Regular, severe headaches. Unexplained weight loss or gain. Feelings of isolation, withdrawal or worthlessness. Constant anger and irritability. Loss of interest in activities. Constant worrying or obsessive  thinking. Excessive alcohol or drug use. Inability to concentrate.  10 Ways to Cope with Chronic Stress It's key to recognize stressful situations as they occur because it allows you to focus on managing how you react. We all need to know when to close our eyes and take a deep breath when we feel tension rising. Use these tips to prevent or reduce chronic stress. 1. Rebalance Work and Home All work and no play? If you're spending too much time at the office, intentionally put more dates in your calendar to enjoy time for fun, either alone or with others. 2. Get Regular Exercise Moving your body on a regular basis balances the nervous system and increases blood circulation, helping to flush out stress hormones. Even a daily 20-minute walk makes a difference. Any kind of exercise can lower stress and improve your mood ? just pick activities that  you enjoy and make it a regular habit. 3. Eat Well and Limit Alcohol and Stimulants Alcohol, nicotine and caffeine may temporarily relieve stress but have negative health impacts and can make stress worse in the long run. Well-nourished bodies cope better, so start with a good breakfast, add more organic fruits and vegetables for a well-balanced diet, avoid processed foods and sugar, try herbal tea and drink more water. 4. Connect with Supportive People Talking face to face with another person releases hormones that reduce stress. Lean on those good listeners in your life. 5. Carve Out Hobby Time Do you enjoy gardening, reading, listening to music or some other creative pursuit? Engage in activities that bring you pleasure and joy; research shows that reduces stress by almost half and lowers your heart rate, too. 6. Practice Meditation, Stress Reduction or Yoga Relaxation techniques activate a state of restfulness that counterbalances your body's fight-or-flight hormones. Even if this also means a 10-minute break in a long day: listen to music, read, go for a walk in nature, do a hobby, take a bath or spend time with a friend. Also consider doing a mindfulness exercise or try a daily deep breathing or imagery practice. Deep Breathing Slow, calm and deep breathing can help you relax. Try these steps to focus on your breathing and repeat as needed. Find a comfortable position and close your eyes. Exhale and drop your shoulders. Breathe in through your nose; fill your lungs and then your belly. Think of relaxing your body, quieting your mind and becoming calm and peaceful. Breathe out slowly through your nose, relaxing your belly. Think of releasing tension, pain, worries or distress. Repeat steps three and four until you feel relaxed. Imagery This involves using your mind to excite the senses -- sound, vision, smell, taste and feeling. This may help ease your stress. Begin by getting comfortable and  then do some slow breathing. Imagine a place you love being at. It could be somewhere from your childhood, somewhere you vacationed or just a place in your imagination. Feel how it is to be in the place you're imagining. Pay attention to the sounds, air, colors, and who is there with you. This is a place where you feel cared for and loved. All is well. You are safe. Take in all the smells, sounds, tastes and feelings. As you do, feel your body being nourished and healed. Feel the calm that surrounds you. Breathe in all the good. Breathe out any discomfort or tension. 7. Sleep Enough If you get less than seven to eight hours of sleep, your body won't tolerate stress as well  as it could. If stress keeps you up at night, address the cause, and add extra meditation into your day to make up for the lost z's. Try to get seven to nine hours of sleep each night. Make a regular bedtime schedule. Keep your room dark and cool. Try to avoid computers, TV, cell phones and tablets before bed. 8. Bond with Connections You Enjoy Go out for a coffee with a friend, chat with a neighbor, call a family member, visit with a clergy member, or even hang out with your pet. Clinical studies show that spending even a short time with a companion animal can cut anxiety levels almost in half. 9. Take a Vacation Getting away from it all can reset your stress tolerance by increasing your mental and emotional outlook, which makes you a happier, more productive person upon return. Leave your cellphone and laptop at home! 10. See a Counselor, Coach or Therapist If negative thoughts overwhelm your ability to make positive changes, it's time to seek professional help. Make an appointment today--your health and life are worth it."   Depression screen reviewed  PHQ2/ PHQ9 completed or reviewed  Mindfulness or Relaxation training provided Active listening / Reflection utilized  Advance Care and HCPOA education provided Emotional  Support Provided Problem Solving /Task Center strategies reviewed Provided psychoeducation for mental health needs  Provided brief CBT  Reviewed mental health medications and discussed importance of compliance:  Quality of sleep assessed & Sleep Hygiene techniques promoted  Participation in counseling encouraged  Verbalization of feelings encouraged  Suicidal Ideation/Homicidal Ideation assessed: Patient denies SI/HI  Review resources, discussed options and provided patient information about  Mental Health Resources Inter-disciplinary care team collaboration (see longitudinal plan of care)  Patient Goals/Self-Care Activities: Over the next 120 days Attend scheduled medical appointments Utilize healthy coping skills and supportive resources discussed Contact PCP with any questions or concerns Keep 90 percent of counseling appointments Call your insurance provider for more information about your Enhanced Benefits  Check out counseling resources provided  Begin personal counseling with LCSW, to reduce and manage symptoms of Depression and Stress, until well-established with mental health provider Accept all calls from representative with [facility] in an effort to establish ongoing mental health counseling and supportive services. Incorporate into daily practice - relaxation techniques, deep breathing exercises, and mindfulness meditation strategies. Talk about feelings with friends, family members, spiritual advisor, etc. Contact LCSW directly [phone]), if you have questions, need assistance, or if additional social work needs are identified between now and our next scheduled telephone outreach call. Call [phone] for mental health hotline/crisis line if needed (24/7 available) Try techniques to reduce symptoms of anxiety/negative thinking (deep breathing, distraction, positive self talk, etc)  - develop a personal safety plan - develop a plan to deal with triggers like holidays,  anniversaries - exercise at least 2 to 3 times per week - have a plan for how to handle bad days - journal feelings and what helps to feel better or worse - spend time or talk with others at least 2 to 3 times per week - watch for early signs of feeling worse - begin personal counseling - call and visit an old friend - check out volunteer opportunities - join a support group - laugh; watch a funny movie or comedian - learn and use visualization or guided imagery - perform a random act of kindness - practice relaxation or meditation daily - start or continue a personal journal - practice positive thinking and self-talk -continue  with compliance of taking medication  -identify current effective and ineffective coping strategies.  -implement positive self-talk in care to increase self-esteem, confidence and feelings of control.  -consider alternative and complementary therapy approaches such as meditation, mindfulness or yoga.  -journaling, prayer, worship services, meditation or pastoral counseling.  -increase participation in pleasurable group activities such as hobbies, singing, sports or volunteering).  -consider the use of meditative movement therapy such as tai chi, yoga or qigong.  -start a regular daily exercise program based on tolerance, ability and patient choice to support positive thinking and activity    If you are experiencing a Mental Health or Behavioral Health Crisis or need someone to talk to, please call the Suicide and Crisis Lifeline: 988    Patient Goals: Follow up goal      Follow up:  Patient agrees to Care Plan and Follow-up.  Plan: The Managed Medicaid care management team will reach out to the patient again over the next 30 days.  Dickie La, BSW, MSW, [employer] Managed Medicaid LCSW [facility]  [facility] [email or web address] Phone: [phone]

## 2023-02-14 ENCOUNTER — Other Ambulatory Visit: Payer: Medicaid Other | Admitting: Pharmacist

## 2023-02-14 ENCOUNTER — Other Ambulatory Visit: Payer: Medicaid Other

## 2023-02-14 ENCOUNTER — Ambulatory Visit (INDEPENDENT_AMBULATORY_CARE_PROVIDER_SITE_OTHER): Payer: Medicaid Other | Admitting: Clinical

## 2023-02-14 DIAGNOSIS — F331 Major depressive disorder, recurrent, moderate: Secondary | ICD-10-CM | POA: Diagnosis not present

## 2023-02-14 DIAGNOSIS — F329 Major depressive disorder, single episode, unspecified: Secondary | ICD-10-CM

## 2023-02-14 NOTE — Progress Notes (Signed)
02/14/2023 Name: Kaitlyn Crane MRN: [id number] DOB: 02-15-1996  Chief Complaint  Patient presents with   Medication Management    Kaitlyn Crane is a 27 y.o. year old female who presented for a telephone visit.   They were referred to the pharmacist by their Case Management Team  for assistance in managing complex medication management.   Patient is participating in a Managed Medicaid Plan:  Yes  Subjective:  Care Team: Primary Care Provider: Olive Bass, FNP ; Next Scheduled Visit: not currently scheduler Nutritionist: Foyd RN: next appointment 03/06/2023 Psychiatrist: Dr Fulton Reek with [facility]; Next Scheduled Visit: unable to see schedule and patient was unsure of next appt Therapist: is awaiting reassignment to therapist; Next Scheduled Visit: none Allergist: Dr Delorse Lek; next appointment 04/24/2023   Medication Access/Adherence  Current Pharmacy:  [facility] - [place], [place] - [address] [place] [address] Phone: [phone] Fax: [phone]  [id number] [place], [place] - [address] AT [address] [place] [address] Phone: [phone] Fax: [phone]  [id number] - [place], [place] - [address] AT [facility] [address] [place] [address] Phone: [phone] Fax: [phone]   Patient reports affordability concerns with their medications: No  Patient reports access/transportation concerns to their pharmacy: No  Patient reports adherence concerns with their medications:  Yes  not sure that she is taking her medications at the correct time in relation to food.    Depression/Anxiety / possibly bipolar:  Current medications:  Latuda 40mg  - was to take 0.5 tablet = 20mg  for 2 days, then increase to 40mg  daily per last Rx filled 01/31/2023 - prescribed by Dr Brooke Dare.  It was noted in past that patient had  diarrhea with Latuda but she now feels that she had diarrhea due to not eating enough when she took latuda. She report she is taking with evening meal and has been tolerating better now.   Wellbutrin - patient was not sure of dose. Called [facility] Verified Wellbutrin XL 150mg  once daily. She would like to get brand name Wellbutrin but this will not be covered by Medicaid unless her psychiatrist provide a good reason or patient has tried generic and is unable to take it.   Trazodone 50mg  - take 1 tablet at bedtime for sleep (has not started per patient she was instructed to take on if needed for sleep by Dr Brooke Dare)   Medications tried in the past:  fluoxetine (prescribed but pt never started), lamotrigine - cause acne / breakouts  Behavioral Health support: Dr Fulton [facility]ek with [facility]. Patient will be getting counselor soon.   Patient is also seeing nutritionist for nutritional support and decreased appetite.   Objective:  No results found for: "HGBA1C"  Lab Results  Component Value Date   CREATININE 0.78 07/12/2022   BUN 18 07/12/2022   NA 135 07/12/2022   K 3.6 07/12/2022   CL 97 07/12/2022   CO2 30 07/12/2022    Lab Results  Component Value Date   CHOL 109 11/16/2021   HDL 35.30 (L) 11/16/2021   LDLCALC 55 11/16/2021   TRIG 95.0 11/16/2021   CHOLHDL 3 11/16/2021    Medications Reviewed Today     Reviewed by Henrene Pastor, RPH-CPP (Pharmacist) on 02/14/23 at 1251  Med List Status: <None>   Medication Order Taking? Sig Documenting Provider Last Dose Status Informant  buPROPion (WELLBUTRIN) 75 MG  tablet [id number] Yes Take 75 mg by mouth in the morning. [provider] Taking Active   cromolyn (OPTICROM) 4 % ophthalmic solution [id number] No Place 1 drop into both eyes 4 (four) times daily as needed.  Patient not taking: Reported on 02/14/2023   Marcelyn Bruins, MD Not Taking Active   EPIPEN 2-PAK 0.3 MG/0.3ML SOAJ injection [id number]  Inject 0.3 mg into  the muscle as needed for anaphylaxis. Clayborne Dana, NP  Active   feeding supplement (ENSURE ENLIVE / ENSURE PLUS) LIQD [id number] No Take 237 mLs by mouth 3 (three) times daily between meals.  Patient not taking: Reported on 02/14/2023   [provider] Not Taking Active   fluticasone (FLONASE) 50 MCG/ACT nasal spray [id number] No Place 2 sprays into both nostrils daily as needed for allergies or rhinitis.  Patient not taking: Reported on 02/14/2023   Marcelyn Bruins, MD Not Taking Active   lamoTRIgine (LAMICTAL) 25 MG tablet [id number] No Take 25 mg by mouth daily.  Patient not taking: Reported on 02/14/2023   [provider] Not Taking Active   levocetirizine (XYZAL) 5 MG tablet [id number] Yes Take 1 tablet (5 mg total) by mouth daily as needed. Marcelyn Bruins, MD Taking Active   levonorgestrel (MIRENA, 52 MG,) 20 MCG/DAY IUD [id number] Yes used office stock [provider] Taking Active   lurasidone (LATUDA) 40 MG TABS tablet [id number] Yes Take 1 tablet by mouth daily. [provider] Taking Active   Multiple Vitamin (MULTIVITAMIN WITH MINERALS) TABS tablet [id number] No Take 1 tablet by mouth daily.  Patient not taking: Reported on 02/14/2023   Karsten Ro, MD Not Taking Active   omeprazole (PRILOSEC) 20 MG capsule [id number] Yes Take 20 mg by mouth 2 (two) times daily before a meal. [provider] Taking Active   sodium chloride (OCEAN) 0.65 % SOLN nasal spray [id number] Yes Place 1 spray into both nostrils as needed for congestion. [provider] Taking Active   traZODone (DESYREL) 50 MG tablet [id number] Yes Take 50 mg by mouth at bedtime. [provider]  Active            Med Note Clydie Braun, Cindie Laroche Feb 14, 2023 12:50 PM) Not currently taking but per patient Dr Brooke Dare told her she could start if needed for sleep  triamcinolone ointment (KENALOG) 0.1 % [id number] Yes Apply sparingly to affected areas twice daily as  needed below face and neck Marcelyn Bruins, MD Taking Active   VENTOLIN HFA 108 (90 Base) MCG/ACT inhaler [id number] Yes TAKE 2 PUFFS BY MOUTH EVERY 6 HOURS AS NEEDED FOR WHEEZE OR SHORTNESS OF BREATH Marcelyn Bruins, MD Taking Active   vitamin B-12 1000 MCG tablet [id number] No Take 1 tablet (1,000 mcg total) by mouth daily.  Patient not taking: Reported on 02/14/2023   Karsten Ro, MD Not Taking Active   Vitamin D, Ergocalciferol, (DRISDOL) 1.25 MG (50000 UNIT) CAPS capsule [id number] Yes Take 50,000 Units by mouth every 7 (seven) days. [provider] Taking Active               Assessment/Plan:   Depression/Anxiety / possible bipolar: - Reviewed med list and updated with new meds.  - reviewed how to take each medication and discussed best timing for each medication. (Wellbutrin / bupropion should be taken in the morning as it can affect sleep if taken later in the day), take trazodone only at night if needed as it can cause drowsiness,  take Latuda with food but avoid grapefruit and grapefruit juice while taking Latuda.  - Encouraged patient to try generic Wellbutrin. - Discuss how different psychiatric medication might work to improve thinking and mood.  - Reviewed behavioral health support strategies. Encouraged her to get set up with counselor. Also discussed when she should reach out of assistance - abnormal thinking or behavior, sleeping too much or too little, changes in weight or appetite.  - Tried to call [facility] to get list of patients medications prescribed by Dr Brooke Dare so that we can make sure our list if accurate. LM on VM.    Henrene Pastor, PharmD Clinical Pharmacist [facility]

## 2023-02-14 NOTE — Patient Instructions (Signed)
Visit Information  Kaitlyn Crane was given information about Medicaid Managed Care team care coordination services as a part of their [employer] Community Plan Medicaid benefit. Kaitlyn Crane verbally consented to engagement with the Medicaid Managed Care team.   If you are experiencing a medical emergency, please call 911 or report to your local emergency department or urgent care.   If you have a non-emergency medical problem during routine business hours, please contact your provider's office and ask to speak with a nurse.   For questions related to your [employer] Community Plan Medicaid, please call: [phone] or visit the homepage here: [email or web address]  If you would like to schedule transportation through your [employer] Community Plan Medicaid, please call the following number at least 2 days in advance of your appointment: [phone]   Rides for urgent appointments can also be made after hours by calling Member Services.  Call the Behavioral Health Crisis Line at 1-877-334-1141, at any time, 24 hours a day, 7 days a week. If you are in danger or need immediate medical attention call 911.  If you would like help to quit smoking, call 1-800-QUIT-NOW (1-800-784-8669) OR Espaol: 1-855-Djelo-Ya (1-855-335-3569) o para ms informacin haga clic aqu or Text READY to 200-400 to register via text  Kaitlyn Crane - following are the goals we discussed in your visit today:   Goals Addressed   None     Social Worker will follow up in 30 days.   Lashell Moffitt, BSW, MHA [place]  Managed Medicaid Social Worker [phone]   Following is a copy of your plan of care:  There are no care plans that you recently modified to display for this patient.   

## 2023-02-14 NOTE — Patient Outreach (Signed)
Medicaid Managed Care Social Work Note  02/14/2023 Name:  Kaitlyn Crane MRN:  [id number] DOB:  Mar 03, 1996  Kaitlyn Crane is an 27 y.o. year old female who is a primary patient of Olive Bass, FNP.  The Medicaid Managed Care Coordination team was consulted for assistance with:  Community Resources   Kaitlyn Crane was given information about Medicaid Managed Care Coordination team services today. Kaitlyn Crane Patient agreed to services and verbal consent obtained.  Engaged with patient  for by telephone forfollow up visit in response to referral for case management and/or care coordination services.   Assessments/Interventions:  Review of past medical history, allergies, medications, health status, including review of consultants reports, laboratory and other test data, was performed as part of comprehensive evaluation and provision of chronic care management services.  SDOH: (Social Determinant of Health) assessments and interventions performed: SDOH Interventions    Flowsheet Row Patient Outreach Telephone from 02/12/2023 in [place] POPULATION HEALTH DEPARTMENT Patient Outreach Telephone from 01/07/2023 in [place] POPULATION HEALTH DEPARTMENT Patient Outreach Telephone from 12/26/2022 in [place] POPULATION HEALTH DEPARTMENT Patient Outreach Telephone from 12/11/2022 in [place] POPULATION HEALTH DEPARTMENT Patient Outreach Telephone from 11/30/2022 in [place] POPULATION HEALTH DEPARTMENT Patient Outreach Telephone from 10/10/2022 in [place] POPULATION HEALTH DEPARTMENT  SDOH Interventions        Stress Interventions Offered Community Wellness Resources, Provide Counseling Offered Community Wellness Resources, Provide Counseling Offered Community Wellness Resources, Provide Counseling Offered Community Wellness Resources, Provide Counseling Offered Community Wellness Resources, Provide Counseling Offered [employer] Resources, Provide Counseling     BSW completed a telephone  outreach with patient. She stated she has not had a chance to look at or contact the resources sent to her. Patient is requesting housing resources. She was approved in October for a housing voucher near [employer] but stated it was not up to par. BSW will send patient via email affordable housing options.  Advanced Directives Status:  Not addressed in this encounter.  Care Plan                 Allergies  Allergen Reactions   Shellfish Allergy Other (See Comments)    Swelling of tonsils   Carbamide Peroxide Rash   Nickel Sulfate [Nickel] Rash    Medications Reviewed Today     Reviewed by Kaitlyn Crane, RD (Registered Dietitian) on 12/18/22 at 1406  Med List Status: <None>   Medication Order Taking? Sig Documenting Provider Last Dose Status Informant  cromolyn (OPTICROM) 4 % ophthalmic solution [id number]  Place 1 drop into both eyes 4 (four) times daily as needed. Marcelyn Bruins, MD  Active   EPIPEN 2-PAK 0.3 MG/0.3ML SOAJ injection [id number]  Inject 0.3 mg into the muscle as needed for anaphylaxis. Clayborne Dana, NP  Active   FLUoxetine (PROZAC) 10 MG capsule [id number]  Take 1 capsule (10 mg total) by mouth daily. Meta Hatchet, PA  Active   fluticasone (FLONASE) 50 MCG/ACT nasal spray [id number]  Place 2 sprays into both nostrils daily as needed for allergies or rhinitis. Marcelyn Bruins, MD  Active   lamoTRIgine (LAMICTAL) 25 MG tablet [id number] Yes Take 25 mg by mouth daily. [provider]  Active   levocetirizine (XYZAL) 5 MG tablet [id number]  Take 1 tablet (5 mg total) by mouth daily as needed. Marcelyn Bruins, MD  Active   lurasidone (LATUDA) 20 MG TABS tablet [id number] No Take by mouth.  Patient not taking: Reported on 12/18/2022  [provider] Not Taking Active   Multiple Vitamin (MULTIVITAMIN WITH MINERALS) TABS tablet [id number]  Take 1 tablet by mouth daily. Karsten Ro, MD  Consider Medication Status and Discontinue    omeprazole (PRILOSEC OTC) 20 MG tablet [id number]  Take 20 mg by mouth 2 (two) times daily before a meal. [provider]  Active   traZODone (DESYREL) 50 MG tablet [id number]  Take 1 tablet (50 mg total) by mouth at bedtime. Meta Hatchet, PA  Active   triamcinolone ointment (KENALOG) 0.1 % [id number]  Apply sparingly to affected areas twice daily as needed below face and neck Marcelyn Bruins, MD  Active   VENTOLIN HFA 108 (90 Base) MCG/ACT inhaler [id number]  TAKE 2 PUFFS BY MOUTH EVERY 6 HOURS AS NEEDED FOR WHEEZE OR SHORTNESS OF BREATH Marcelyn Bruins, MD  Active   vitamin B-12 1000 MCG tablet [id number]  Take 1 tablet (1,000 mcg total) by mouth daily. Karsten Ro, MD  Active   Vitamin D, Ergocalciferol, (DRISDOL) 1.25 MG (50000 UNIT) CAPS capsule [id number]  Take 50,000 Units by mouth every 7 (seven) days. 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   Follow up:  Patient agrees to Care Plan and Follow-up.  Plan: The Managed Medicaid care management team will reach out to the patient  again over the next 30 days.  Date/time of next scheduled Social Work care management/care coordination outreach:  03/19/23  Gus Puma, Kenard Gower, [facility]  Managed [facility] Social Worker [phone]
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
## 2023-02-19 ENCOUNTER — Other Ambulatory Visit (HOSPITAL_COMMUNITY): Payer: Self-pay | Admitting: Physician Assistant

## 2023-02-20 DIAGNOSIS — Z30431 Encounter for routine checking of intrauterine contraceptive device: Secondary | ICD-10-CM | POA: Diagnosis not present

## 2023-02-22 ENCOUNTER — Other Ambulatory Visit: Payer: Self-pay | Admitting: Allergy

## 2023-02-26 ENCOUNTER — Other Ambulatory Visit: Payer: Medicaid Other | Admitting: Licensed Clinical Social Worker

## 2023-02-26 NOTE — Patient Outreach (Signed)
Medicaid Managed Care Social Work Note  02/26/2023 Name:  Kaitlyn Crane MRN:  [id number] DOB:  [birth year]  Kaitlyn Crane is an 27 y.o. year old female who is a primary patient of Olive Bass, FNP.  The Medicaid Managed Care Coordination team was consulted for assistance with:  Mental Health Counseling and Resources  Kaitlyn Crane was given information about Medicaid Managed Care Coordination team services today. Kaitlyn Crane Patient agreed to services and verbal consent obtained.  Engaged with patient  for by telephone forfollow up visit in response to referral for case management and/or care coordination services.   Assessments/Interventions:  Review of past medical history, allergies, medications, health status, including review of consultants reports, laboratory and other test data, was performed as part of comprehensive evaluation and provision of chronic care management services.  SDOH: (Social Determinant of Health) assessments and interventions performed: SDOH Interventions    Flowsheet Row Patient Outreach Telephone from 02/26/2023 in [place] POPULATION HEALTH DEPARTMENT Patient Outreach Telephone from 02/12/2023 in [place] POPULATION HEALTH DEPARTMENT Patient Outreach Telephone from 01/07/2023 in [place] POPULATION HEALTH DEPARTMENT Patient Outreach Telephone from 12/26/2022 in [place] POPULATION HEALTH DEPARTMENT Patient Outreach Telephone from 12/11/2022 in [place] POPULATION HEALTH DEPARTMENT Patient Outreach Telephone from 11/30/2022 in [place] POPULATION HEALTH DEPARTMENT  SDOH Interventions        Stress Interventions Offered [employer] [facility], Provide Counseling  [crying spells this past week but in good spirits as of today] [employer], Provide Counseling Offered [employer] [facility], Provide Counseling Offered [employer] [facility], Provide Counseling Offered [facility], Provide Counseling Offered  [employer] [facility], Provide Counseling       Advanced Directives Status:  See Care Plan for related entries.  Care Plan                 Allergies  Allergen Reactions   Shellfish Allergy Other (See Comments)    Swelling of tonsils   Carbamide Peroxide Rash   Nickel Sulfate [Nickel] Rash    Medications Reviewed Today     Reviewed by Kaitlyn Crane, RPH-CPP (Pharmacist) on 02/14/23 at 1251  Med List Status: <None>   Medication Order Taking? Sig Documenting Provider Last Dose Status Informant  buPROPion (WELLBUTRIN) 75 MG tablet [id number] Yes Take 75 mg by mouth in the morning. [provider] Taking Active   cromolyn (OPTICROM) 4 % ophthalmic solution [id number] No Place 1 drop into both eyes 4 (four) times daily as needed.  Patient not taking: Reported on 02/14/2023   Marcelyn Bruins, MD Not Taking Active   EPIPEN 2-PAK 0.3 MG/0.3ML SOAJ injection [id number]  Inject 0.3 mg into the muscle as needed for anaphylaxis. Clayborne Dana, NP  Active   feeding supplement (ENSURE ENLIVE / ENSURE PLUS) LIQD [id number] No Take 237 mLs by mouth 3 (three) times daily between meals.  Patient not taking: Reported on 02/14/2023   [provider] Not Taking Active   fluticasone (FLONASE) 50 MCG/ACT nasal spray [id number] No Place 2 sprays into both nostrils daily as needed for allergies or rhinitis.  Patient not taking: Reported on 02/14/2023   Marcelyn Bruins, MD Not Taking Active   lamoTRIgine (LAMICTAL) 25 MG tablet [id number] No Take 25 mg by mouth daily.  Patient not taking: Reported on 02/14/2023   [provider] Not Taking Active   levocetirizine (XYZAL) 5 MG tablet [id number] Yes Take 1 tablet (5 mg total) by mouth daily as needed. Marcelyn Bruins, MD  Taking Active   levonorgestrel (MIRENA, 52 MG,) 20 MCG/DAY IUD [id number] Yes used office stock [provider] Taking Active   lurasidone (LATUDA) 40 MG TABS tablet [id number] Yes  Take 1 tablet by mouth daily. [provider] Taking Active   Multiple Vitamin (MULTIVITAMIN WITH MINERALS) TABS tablet [id number] No Take 1 tablet by mouth daily.  Patient not taking: Reported on 02/14/2023   Karsten Ro, MD Not Taking Active   omeprazole (PRILOSEC) 20 MG capsule [id number] Yes Take 20 mg by mouth 2 (two) times daily before a meal. [provider] Taking Active   sodium chloride (OCEAN) 0.65 % SOLN nasal spray [id number] Yes Place 1 spray into both nostrils as needed for congestion. [provider] Taking Active   traZODone (DESYREL) 50 MG tablet [id number] Yes Take 50 mg by mouth at bedtime. [provider]  Active            Med Note Kaitlyn Crane, Kaitlyn Crane Feb 14, 2023 12:50 PM) Not currently taking but per patient Dr Kaitlyn Crane told her she could start if needed for sleep  triamcinolone ointment (KENALOG) 0.1 % [id number] Yes Apply sparingly to affected areas twice daily as needed below face and neck Marcelyn Bruins, MD Taking Active   VENTOLIN HFA 108 (90 Base) MCG/ACT inhaler [id number] Yes TAKE 2 PUFFS BY MOUTH EVERY 6 HOURS AS NEEDED FOR WHEEZE OR SHORTNESS OF BREATH Marcelyn Bruins, MD Taking Active   vitamin B-12 1000 MCG tablet [id number] No Take 1 tablet (1,000 mcg total) by mouth daily.  Patient not taking: Reported on 02/14/2023   Karsten Ro, MD Not Taking Active   Vitamin D, Ergocalciferol, (DRISDOL) 1.25 MG (50000 UNIT) CAPS capsule [id number] Yes Take 50,000 Units by mouth every 7 (seven) days. 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   Encounter for insertion of mirena IUD - inserted 12/12/12 12/12/2012   SVD (spontaneous vaginal delivery) 09/23/2012   Chorioamnionitis 09/23/2012   Late prenatal care 08/27/2012   Normal pregnancy, first 08/11/2012   Anemia 09/17/2011    Conditions to be addressed/monitored per PCP order:  Depression  Care Plan : LCSW Plan of Care  Updates made by Gustavus Bryant, LCSW since 02/26/2023 12:00 AM     Problem: Depression Identification (Depression)      Long-Range Goal: Depressive Symptoms Identified   Start Date: 09/25/2022  Note:    Priority: High  Timeframe:  Long-Range Goal Priority:  High Start Date:   09/25/22            Expected End Date:  ongoing                     Follow Up Date--03/13/23 at 11 am   - keep 90 percent of scheduled appointments -consider counseling or psychiatry -consider bumping up your self-care  -consider creating a stronger support network   Why is this important?  Combatting depression may take some time.            If you don't feel better right away, don't give up on your treatment plan.    Current barriers:   Chronic Mental Health needs related to bipolar, depression and stress. Patient requires Support, Education, Resources, Referrals, Advocacy, and Care Coordination, in order to meet Unmet Mental Health Needs. Patient will implement clinical interventions discussed today to decrease symptoms of depression and increase knowledge and/or ability of: coping skills. Mental Health Concerns and Social Isolation Patient lacks knowledge of available community counseling agencies and resources. Lack of a stable support network  Clinical Goal(s): verbalize understanding of plan for management of Bipolar, Depression, and Stress and demonstrate a reduction in symptoms. Patient will connect with a provider for ongoing  mental health treatment, increase coping skills, healthy habits, self-management skills, and stress reduction        Clinical Interventions:  Assessed patient's previous and current treatment, coping skills, support system and barriers to care. Patient provided hx  Verbalization of feelings encouraged, motivational interviewing employed Emotional support provided, positive coping strategies explored. Establishing healthy boundaries to use with family emphasized and healthy self-care education provided. Patient has had issues in the past with family interfering with her mental health care.  Patient was educated on available mental health resources within their area that accept Medicaid and offer counseling and psychiatry. Email sent to patient today with instructions for scheduling at [facility] as well as some crisis support resources and [facility]'s walk in clinic hours Patient reports that she wishes to change her mental health providers from [facility] to [facility]. Patient reports that her niece is a strong and stable support for her.  Patient is agreeable to referral to [facility] for counseling and psychiatry. [facility] LCSW made referral on 09/25/22. LCSW provided education on relaxation techniques such as meditation, deep breathing, massage, grounding exercises or yoga that can activate the body's relaxation response and ease symptoms of stress and anxiety. LCSW ask that when pt is struggling with difficult emotions and racing thoughts that they start this relaxation response process. LCSW provided extensive education on healthy coping skills for anxiety. SW used active and reflective listening, validated patient's feelings/concerns, and provided emotional support. Patient will work on implementing appropriate self-care habits into their daily routine such as: staying positive, writing a gratitude list, drinking water, staying active around the house, taking their medications as prescribed, combating negative  thoughts or emotions and staying connected with their family and friends. Positive reinforcement provided for this decision to work on this. Past update- Patient reports that her PCP made a new referral to [facility] and they contacted her but she is not sure if her insurance is in their network and will be contacting her insurance provider to inquire. Patient reports that she has started group therapy at the [facility] and also an Emotional Wellness Group at [employer]. She also signed up for a work training program at [employer] that will provide two weeks of hands on training before setting her up with new employment. Patient also has her initial intake appointment set up with [facility] that [facility] LCSW referred her to but this appointment isn't until next month. 10/25/22 UPDATE- [facility] LCSW talked to patient while she was in the waiting room at [facility] to see a psychiatrist. She already has completed her initial counseling session there and reports feeling comfortable with her current mental health care team. [facility] LCSW will follow up later this  month. 11/30/22 UPDATE- Patient reports that she wishes to continue psychiatry services at [facility] and continue counseling at [facility] with counselor Idalia Needle. She reports that she is on a new medication that she is experiencing crying spells as a side effect. Patient was advised to contact her psychiatrist before her appointment next week to update her provider on this negative side effects. Crisis resource education provided. LCSW 12/11/22 update- Patient reports that she did NOT stop the medication that her psychiatrist prescribed that were causing her adverse side effects but that she did call and schedule for a follow up appointment which is tomorrow. This appointment is virtual. She reports that she has decided to pursue counseling elsewhere after talking with her counseling at [facility] and a recommendation was made for her to follow up with. She reports  that this recommendation provides specialized therapy services that she is interested in but she has not taken the time to reach out to this resource yet but will do so this week. [facility] LCSW will follow up next month. [facility] LCSW 12/26/22 Update- Patient reports that she has an upcoming psychiatry appointment next week but sent an email to her psychiatrist asking for an earlier appointment due to experiencing ongoing adverse side effects of her current medication that the psychiatrist prescribed. Patient reports that she is currently getting enrolled with [facility] in order to start EMDR therapy. Positive reinforcement provided for her ongoing active self-care implementation.  Patient reports that they experience both anxiety and depression.She shares that he feels alone most days. She was receptive to anxiety and depression management coping skill education. UPDATE- Patient reports that her main stressor at this time is financially related. She reports that financial strain lately has made her unable to get out of the bed. Referral made for [facility] BSW for financial support and resource connection/education. 02/12/23 LCSW update- Patient reports need for pharmacy assistance and services other than what her pharmacy can provide to her regarding her journey with finding the right mental health medication combination and her trial and error process. Patient had a really bad skin reaction to lamictal and is now on latuda and her psychiatrist is now suggesting wellbutrin and has ordered this medication to assist with patient's depression. [facility] LCSW will ask [facility] team regarding need for specific pharmacy assistance/education and support services regarding side effect of medications. Emotional support, self-care and coping tool education provided. Update- Patient reports improvement in mood and adjustment to new psychiatry medications. She reports that she does experience some crying spells but today is feeling  motivated and positive. Patient reports that she has been avoiding contacting her peer support specialist from [facility] due to shame. [facility] LCSW implemented CBT intervention and patient was able to change her thought process and negative narrative regarding her sense of self. Patient was able to acknowledge all of the success she has actively made towards reaching her mental health needs. Positive reinforcement and emotional encouragement provided to patient as she does put into work and has been able to positively change and impact her life even throughout recent difficulties and challenges (specifically regarding medication trials and errors.) [facility] LCSW will follow up in two weeks.  The following coping skill education was provided for stress relief and mental health management: "When your car dies or a deadline looms, how do you respond? Long-term, low-grade or acute stress takes a serious toll on your body and mind, so don't ignore feelings of constant tension. Stress is a natural part of life.  However, too much stress can harm our health, especially if it continues every day. This is chronic stress and can put you at risk for heart problems like heart disease and depression. Understand what's happening inside your body and learn simple coping skills to combat the negative impacts of everyday stressors.  Types of Stress There are two types of stress: Emotional - types of emotional stress are relationship problems, pressure at work, financial worries, experiencing discrimination or having a major life change. Physical - Examples of physical stress include being sick having pain, not sleeping well, recovery from an injury or having an alcohol and drug use disorder. Fight or Flight Sudden or ongoing stress activates your nervous system and floods your bloodstream with adrenaline and cortisol, two hormones that raise blood pressure, increase heart rate and spike blood sugar. These changes pitch your  body into a fight or flight response. That enabled our ancestors to outrun saber-toothed tigers, and it's helpful today for situations like dodging a car accident. But most modern chronic stressors, such as finances or a challenging relationship, keep your body in that heightened state, which hurts your health. Effects of Too Much Stress If constantly under stress, most of [place] will eventually start to function less well.  Multiple studies link chronic stress to a higher risk of heart disease, stroke, depression, weight gain, memory loss and even premature death, so it's important to recognize the warning signals. Talk to your doctor about ways to manage stress if you're experiencing any of these symptoms: Prolonged periods of poor sleep. Regular, severe headaches. Unexplained weight loss or gain. Feelings of isolation, withdrawal or worthlessness. Constant anger and irritability. Loss of interest in activities. Constant worrying or obsessive thinking. Excessive alcohol or drug use. Inability to concentrate.  10 Ways to Cope with Chronic Stress It's key to recognize stressful situations as they occur because it allows you to focus on managing how you react. We all need to know when to close our eyes and take a deep breath when we feel tension rising. Use these tips to prevent or reduce chronic stress. 1. Rebalance Work and Home All work and no play? If you're spending too much time at the office, intentionally put more dates in your calendar to enjoy time for fun, either alone or with others. 2. Get Regular Exercise Moving your body on a regular basis balances the nervous system and increases blood circulation, helping to flush out stress hormones. Even a daily 20-minute walk makes a difference. Any kind of exercise can lower stress and improve your mood ? just pick activities that you enjoy and make it a regular habit. 3. Eat Well and Limit Alcohol and Stimulants Alcohol, nicotine and caffeine  may temporarily relieve stress but have negative health impacts and can make stress worse in the long run. Well-nourished bodies cope better, so start with a good breakfast, add more organic fruits and vegetables for a well-balanced diet, avoid processed foods and sugar, try herbal tea and drink more water. 4. Connect with Supportive People Talking face to face with another person releases hormones that reduce stress. Lean on those good listeners in your life. 5. Carve Out Hobby Time Do you enjoy gardening, reading, listening to music or some other creative pursuit? Engage in activities that bring you pleasure and joy; research shows that reduces stress by almost half and lowers your heart rate, too. 6. Practice Meditation, Stress Reduction or Yoga Relaxation techniques activate a state of restfulness that counterbalances your body's fight-or-flight  hormones. Even if this also means a 10-minute break in a long day: listen to music, read, go for a walk in nature, do a hobby, take a bath or spend time with a friend. Also consider doing a mindfulness exercise or try a daily deep breathing or imagery practice. Deep Breathing Slow, calm and deep breathing can help you relax. Try these steps to focus on your breathing and repeat as needed. Find a comfortable position and close your eyes. Exhale and drop your shoulders. Breathe in through your nose; fill your lungs and then your belly. Think of relaxing your body, quieting your mind and becoming calm and peaceful. Breathe out slowly through your nose, relaxing your belly. Think of releasing tension, pain, worries or distress. Repeat steps three and four until you feel relaxed. Imagery This involves using your mind to excite the senses -- sound, vision, smell, taste and feeling. This may help ease your stress. Begin by getting comfortable and then do some slow breathing. Imagine a place you love being at. It could be somewhere from your childhood, somewhere  you vacationed or just a place in your imagination. Feel how it is to be in the place you're imagining. Pay attention to the sounds, air, colors, and who is there with you. This is a place where you feel cared for and loved. All is well. You are safe. Take in all the smells, sounds, tastes and feelings. As you do, feel your body being nourished and healed. Feel the calm that surrounds you. Breathe in all the good. Breathe out any discomfort or tension. 7. Sleep Enough If you get less than seven to eight hours of sleep, your body won't tolerate stress as well as it could. If stress keeps you up at night, address the cause, and add extra meditation into your day to make up for the lost z's. Try to get seven to nine hours of sleep each night. Make a regular bedtime schedule. Keep your room dark and cool. Try to avoid computers, TV, cell phones and tablets before bed. 8. Bond with Connections You Enjoy Go out for a coffee with a friend, chat with a neighbor, call a family member, visit with a clergy member, or even hang out with your pet. Clinical studies show that spending even a short time with a companion animal can cut anxiety levels almost in half. 9. Take a Vacation Getting away from it all can reset your stress tolerance by increasing your mental and emotional outlook, which makes you a happier, more productive person upon return. Leave your cellphone and laptop at home! 10. See a Counselor, Coach or Therapist If negative thoughts overwhelm your ability to make positive changes, it's time to seek professional help. Make an appointment today--your health and life are worth it."   Depression screen reviewed  PHQ2/ PHQ9 completed or reviewed  Mindfulness or Relaxation training provided Active listening / Reflection utilized  Advance Care and HCPOA education provided Emotional Support Provided Problem Solving /Task Center strategies reviewed Provided psychoeducation for mental health needs   Provided brief CBT  Reviewed mental health medications and discussed importance of compliance:  Quality of sleep assessed & Sleep Hygiene techniques promoted  Participation in counseling encouraged  Verbalization of feelings encouraged  Suicidal Ideation/Homicidal Ideation assessed: Patient denies SI/HI  Review resources, discussed options and provided patient information about  Mental Health Resources Inter-disciplinary care team collaboration (see longitudinal plan of care)  Patient Goals/Self-Care Activities: Over the next 120 days Attend scheduled medical  appointments Utilize healthy coping skills and supportive resources discussed Contact PCP with any questions or concerns Keep 90 percent of counseling appointments Call your insurance provider for more information about your Enhanced Benefits  Check out counseling resources provided  Begin personal counseling with LCSW, to reduce and manage symptoms of Depression and Stress, until well-established with mental health provider Accept all calls from representative with [facility] in an effort to establish ongoing mental health counseling and supportive services. Incorporate into daily practice - relaxation techniques, deep breathing exercises, and mindfulness meditation strategies. Talk about feelings with friends, family members, spiritual advisor, etc. Contact LCSW directly [phone]), if you have questions, need assistance, or if additional social work needs are identified between now and our next scheduled telephone outreach call. Call [phone] for mental health hotline/crisis line if needed (24/7 available) Try techniques to reduce symptoms of anxiety/negative thinking (deep breathing, distraction, positive self talk, etc)  - develop a personal safety plan - develop a plan to deal with triggers like holidays, anniversaries - exercise at least 2 to 3 times per week - have a plan for how to handle bad days - journal feelings and what  helps to feel better or worse - spend time or talk with others at least 2 to 3 times per week - watch for early signs of feeling worse - begin personal counseling - call and visit an old friend - check out volunteer opportunities - join a support group - laugh; watch a funny movie or comedian - learn and use visualization or guided imagery - perform a random act of kindness - practice relaxation or meditation daily - start or continue a personal journal - practice positive thinking and self-talk -continue with compliance of taking medication  -identify current effective and ineffective coping strategies.  -implement positive self-talk in care to increase self-esteem, confidence and feelings of control.  -consider alternative and complementary therapy approaches such as meditation, mindfulness or yoga.  -journaling, prayer, worship services, meditation or pastoral counseling.  -increase participation in pleasurable group activities such as hobbies, singing, sports or volunteering).  -consider the use of meditative movement therapy such as tai chi, yoga or qigong.  -start a regular daily exercise program based on tolerance, ability and patient choice to support positive thinking and activity    If you are experiencing a Mental Health or Behavioral Health Crisis or need someone to talk to, please call the Suicide and Crisis Lifeline: 988    Patient Goals: Follow up goal      Follow up:  Patient agrees to Care Plan and Follow-up.  Plan: The Managed Medicaid care management team will reach out to the patient again over the next 30 days.  Dickie La, BSW, MSW, [employer] Managed Medicaid LCSW [facility]  [email or web address] Phone: [phone]

## 2023-02-26 NOTE — Patient Instructions (Signed)
Visit Information  Kaitlyn Crane was given information about Medicaid Managed Care team care coordination services as a part of their [facility] Community Plan Medicaid benefit. Kaitlyn Crane verbally consented to engagement with the [facility] Managed Care team.   If you are experiencing a medical emergency, please call 911 or report to your local emergency department or urgent care.   If you have a non-emergency medical problem during routine business hours, please contact your provider's office and ask to speak with a nurse.   For questions related to your [facility], please call: [phone] or visit the homepage here: [email or web address]  If you would like to schedule transportation through your [facility], please call the following number at least 2 days in advance of your appointment: [phone]   Rides for urgent appointments can also be made after hours by calling Member Services.  Call the Behavioral Health Crisis Line at [phone], at any time, 24 hours a day, 7 days a week. If you are in danger or need immediate medical attention call 911.  If you would like help to quit smoking, call 1-800-QUIT-NOW (469 579 8913) OR Espaol: 1-855-Djelo-Ya (1-324-401-0272) o para ms informacin haga clic aqu or Text READY to 536-644 to register via text   Following is a copy of your plan of care:  Care Plan : LCSW Plan of Care  Updates made by Gustavus Bryant, LCSW since 02/26/2023 12:00 AM     Problem: Depression Identification (Depression)      Long-Range Goal: Depressive Symptoms Identified   Start Date: 09/25/2022  Note:    Priority: High  Timeframe:  Long-Range Goal Priority:  High Start Date:   09/25/22            Expected End Date:  ongoing                     Follow Up Date--03/13/23 at 11 am   - keep 90 percent of scheduled appointments -consider counseling or  psychiatry -consider bumping up your self-care  -consider creating a stronger support network   Why is this important?             Combatting depression may take some time.            If you don't feel better right away, don't give up on your treatment plan.   Types of Stress There are two types of stress: Emotional - types of emotional stress are relationship problems, pressure at work, financial worries, experiencing discrimination or having a major life change. Physical - Examples of physical stress include being sick having pain, not sleeping well, recovery from an injury or having an alcohol and drug use disorder. Fight or Flight Sudden or ongoing stress activates your nervous system and floods your bloodstream with adrenaline and cortisol, two hormones that raise blood pressure, increase heart rate and spike blood sugar. These changes pitch your body into a fight or flight response. That enabled our ancestors to outrun saber-toothed tigers, and it's helpful today for situations like dodging a car accident. But most modern chronic stressors, such as finances or a challenging relationship, keep your body in that heightened state, which hurts your health. Effects of Too Much Stress If constantly under stress, most of [place] will eventually start to function less well.  Multiple studies link chronic stress to a higher risk of heart disease, stroke, depression, weight gain, memory loss and even premature death, so it's important to recognize  the warning signals. Talk to your doctor about ways to manage stress if you're experiencing any of these symptoms: Prolonged periods of poor sleep. Regular, severe headaches. Unexplained weight loss or gain. Feelings of isolation, withdrawal or worthlessness. Constant anger and irritability. Loss of interest in activities. Constant worrying or obsessive thinking. Excessive alcohol or drug use. Inability to concentrate.  10 Ways to Cope with Chronic  Stress It's key to recognize stressful situations as they occur because it allows you to focus on managing how you react. We all need to know when to close our eyes and take a deep breath when we feel tension rising. Use these tips to prevent or reduce chronic stress. 1. Rebalance Work and Home All work and no play? If you're spending too much time at the office, intentionally put more dates in your calendar to enjoy time for fun, either alone or with others. 2. Get Regular Exercise Moving your body on a regular basis balances the nervous system and increases blood circulation, helping to flush out stress hormones. Even a daily 20-minute walk makes a difference. Any kind of exercise can lower stress and improve your mood ? just pick activities that you enjoy and make it a regular habit. 3. Eat Well and Limit Alcohol and Stimulants Alcohol, nicotine and caffeine may temporarily relieve stress but have negative health impacts and can make stress worse in the long run. Well-nourished bodies cope better, so start with a good breakfast, add more organic fruits and vegetables for a well-balanced diet, avoid processed foods and sugar, try herbal tea and drink more water. 4. Connect with Supportive People Talking face to face with another person releases hormones that reduce stress. Lean on those good listeners in your life. 5. Carve Out Hobby Time Do you enjoy gardening, reading, listening to music or some other creative pursuit? Engage in activities that bring you pleasure and joy; research shows that reduces stress by almost half and lowers your heart rate, too. 6. Practice Meditation, Stress Reduction or Yoga Relaxation techniques activate a state of restfulness that counterbalances your body's fight-or-flight hormones. Even if this also means a 10-minute break in a long day: listen to music, read, go for a walk in nature, do a hobby, take a bath or spend time with a friend. Also consider doing a  mindfulness exercise or try a daily deep breathing or imagery practice. Deep Breathing Slow, calm and deep breathing can help you relax. Try these steps to focus on your breathing and repeat as needed. Find a comfortable position and close your eyes. Exhale and drop your shoulders. Breathe in through your nose; fill your lungs and then your belly. Think of relaxing your body, quieting your mind and becoming calm and peaceful. Breathe out slowly through your nose, relaxing your belly. Think of releasing tension, pain, worries or distress. Repeat steps three and four until you feel relaxed. Imagery This involves using your mind to excite the senses -- sound, vision, smell, taste and feeling. This may help ease your stress. Begin by getting comfortable and then do some slow breathing. Imagine a place you love being at. It could be somewhere from your childhood, somewhere you vacationed or just a place in your imagination. Feel how it is to be in the place you're imagining. Pay attention to the sounds, air, colors, and who is there with you. This is a place where you feel cared for and loved. All is well. You are safe. Take in all the smells, sounds,  tastes and feelings. As you do, feel your body being nourished and healed. Feel the calm that surrounds you. Breathe in all the good. Breathe out any discomfort or tension. 7. Sleep Enough If you get less than seven to eight hours of sleep, your body won't tolerate stress as well as it could. If stress keeps you up at night, address the cause, and add extra meditation into your day to make up for the lost z's. Try to get seven to nine hours of sleep each night. Make a regular bedtime schedule. Keep your room dark and cool. Try to avoid computers, TV, cell phones and tablets before bed. 8. Bond with Connections You Enjoy Go out for a coffee with a friend, chat with a neighbor, call a family member, visit with a clergy member, or even hang out with your  pet. Clinical studies show that spending even a short time with a companion animal can cut anxiety levels almost in half. 9. Take a Vacation Getting away from it all can reset your stress tolerance by increasing your mental and emotional outlook, which makes you a happier, more productive person upon return. Leave your cellphone and laptop at home! 10. See a Counselor, Coach or Therapist If negative thoughts overwhelm your ability to make positive changes, it's time to seek professional help. Make an appointment today--your health and life are worth it."   Depression screen reviewed  PHQ2/ PHQ9 completed or reviewed  Mindfulness or Relaxation training provided Active listening / Reflection utilized  Advance Care and HCPOA education provided Emotional Support Provided Problem Solving /Task Center strategies reviewed Provided psychoeducation for mental health needs  Provided brief CBT  Reviewed mental health medications and discussed importance of compliance:  Quality of sleep assessed & Sleep Hygiene techniques promoted  Participation in counseling encouraged  Verbalization of feelings encouraged  Suicidal Ideation/Homicidal Ideation assessed: Patient denies SI/HI  Review resources, discussed options and provided patient information about  Mental Health Resources Inter-disciplinary care team collaboration (see longitudinal plan of care)  Patient Goals/Self-Care Activities: Over the next 120 days Attend scheduled medical appointments Utilize healthy coping skills and supportive resources discussed Contact PCP with any questions or concerns Keep 90 percent of counseling appointments Call your insurance provider for more information about your Enhanced Benefits  Check out counseling resources provided  Begin personal counseling with LCSW, to reduce and manage symptoms of Depression and Stress, until well-established with mental health provider Accept all calls from representative with  [facility] in an effort to establish ongoing mental health counseling and supportive services. Incorporate into daily practice - relaxation techniques, deep breathing exercises, and mindfulness meditation strategies. Talk about feelings with friends, family members, spiritual advisor, etc. Contact LCSW directly [phone]), if you have questions, need assistance, or if additional social work needs are identified between now and our next scheduled telephone outreach call. Call 988 for mental health hotline/crisis line if needed (24/7 available) Try techniques to reduce symptoms of anxiety/negative thinking (deep breathing, distraction, positive self talk, etc)  - develop a personal safety plan - develop a plan to deal with triggers like holidays, anniversaries - exercise at least 2 to 3 times per week - have a plan for how to handle bad days - journal feelings and what helps to feel better or worse - spend time or talk with others at least 2 to 3 times per week - watch for early signs of feeling worse - begin personal counseling - call and visit an old friend - check  out volunteer opportunities - join a support group - laugh; watch a funny movie or comedian - learn and use visualization or guided imagery - perform a random act of kindness - practice relaxation or meditation daily - start or continue a personal journal - practice positive thinking and self-talk -continue with compliance of taking medication  -identify current effective and ineffective coping strategies.  -implement positive self-talk in care to increase self-esteem, confidence and feelings of control.  -consider alternative and complementary therapy approaches such as meditation, mindfulness or yoga.  -journaling, prayer, worship services, meditation or pastoral counseling.  -increase participation in pleasurable group activities such as hobbies, singing, sports or volunteering).  -consider the use of meditative movement  therapy such as tai chi, yoga or qigong.  -start a regular daily exercise program based on tolerance, ability and patient choice to support positive thinking and activity    If you are experiencing a Mental Health or Behavioral Health Crisis or need someone to talk to, please call the Suicide and Crisis Lifeline: 988    Patient Goals: Follow up goal

## 2023-02-27 DIAGNOSIS — F3132 Bipolar disorder, current episode depressed, moderate: Secondary | ICD-10-CM | POA: Diagnosis not present

## 2023-02-28 ENCOUNTER — Ambulatory Visit (INDEPENDENT_AMBULATORY_CARE_PROVIDER_SITE_OTHER): Payer: Medicaid Other | Admitting: Clinical

## 2023-02-28 DIAGNOSIS — F331 Major depressive disorder, recurrent, moderate: Secondary | ICD-10-CM

## 2023-02-28 NOTE — Progress Notes (Signed)
THERAPIST PROGRESS NOTE Virtual Visit via Video Note  I connected with Kaitlyn Crane on 02/28/23 at 10:00 AM EDT by a video enabled telemedicine application and verified that I am speaking with the correct person using two identifiers.  Location: Patient: home Provider: office   I discussed the limitations of evaluation and management by telemedicine and the availability of in person appointments. The patient expressed understanding and agreed to proceed.   Follow Up Instructions: I discussed the assessment and treatment plan with the patient. The patient was provided an opportunity to ask questions and all were answered. The patient agreed with the plan and demonstrated an understanding of the instructions.   The patient was advised to call back or seek an in-person evaluation if the symptoms worsen or if the condition fails to improve as anticipated.   Session Time: 45 minutes  Participation Level: Active  Behavioral Response: CasualAlertEuthymic  Type of Therapy: Individual Therapy  Treatment Goals addressed: client will practice behavioral activation skills 3 times per week for the next 12 weeks  ProgressTowards Goals: Progressing  Interventions: CBT and Supportive  Summary:  Kaitlyn Crane is a 27 y.o. female who presents for the scheduled appointment oriented x 5, appropriately dressed, and friendly.  Client denied hallucinations and delusions. Client reported on today she has been doing pretty well.  Client reported she has been doing well with not having contact to her ex fianc who is incarcerated.  Client reported however he does have a court date coming up where he will be seen and and she has been debating on whether or not she should go.  Client reported a few weeks ago his mom reached out to her and asked if she wants to attend and she told her yes but now she is having second thoughts.  Client reported he has told his family that they are no longer together.  Client  reported ultimately she thinks she will not go to the hearing in [place].  Client reported otherwise she has been socializing and learning how to mingle with men again.  Client reported she has noticed that she tends to be eager to message someone back and is quick to review some went in a light of that could be her boyfriend.  Client reported she wants to learn to not be so quick to put and/or have high expectations on someone she is meeting. Client reported she is working on getting her routine together and having her priorities straight. Client reported she has pressure from her family telling her that she is not doing enough. Client reported she has to tell herself she is single and doing everything herself so she needs to give herself credit. Evidence of progress towards goal:  client reported 1 positive enforcing her mental, emotional and physical boundaries.   Suicidal/Homicidal: Nowithout intent/plan  Therapist Response:  Therapist began the appointment asking the client how she has been doing. Therapist used CBT to engage using active listening and positive emotional support. Therapist used CBT to engage and ask the client how she is doing with transitions within interpersonal relationships. Therapist used CBT to engage discuss making herself a priority, how to enforce boundaries and evaluating cognitive distortions. Therapist used CBT ask the client to identify her progress with frequency of use with coping skills with continued practice in her daily activity.    Therapist assigned the client homework to work on what was discussed in the session.   Plan: Return again in 4 weeks.  Diagnosis: major  depressive disorder, recurrent episode, moderate  Collaboration of Care: Patient refused AEB none requested by the client.  Patient/Guardian was advised Release of Information must be obtained prior to any record release in order to collaborate their care with an outside provider.  Patient/Guardian was advised if they have not already done so to contact the registration department to sign all necessary forms in order for [place] to release information regarding their care.   Consent: Patient/Guardian gives verbal consent for treatment and assignment of benefits for services provided during this visit. Patient/Guardian expressed understanding and agreed to proceed.   Kaitlyn Rhymes Ameira Alessandrini, LCSW 02/28/2023

## 2023-03-03 IMAGING — CT CT HEAD W/O CM
3 of 4 series · 13 of 47 positions shown, 15 images · non-contrast
Comparison: None.

CLINICAL DATA: Mental status change, unknown cause



[Series 3: head without · axial · non-contrast · 0.39mm/px · z∈[+1258,+1368]mm · 7 of 30 slices shown, 9 images]
[im 4/30  brain]
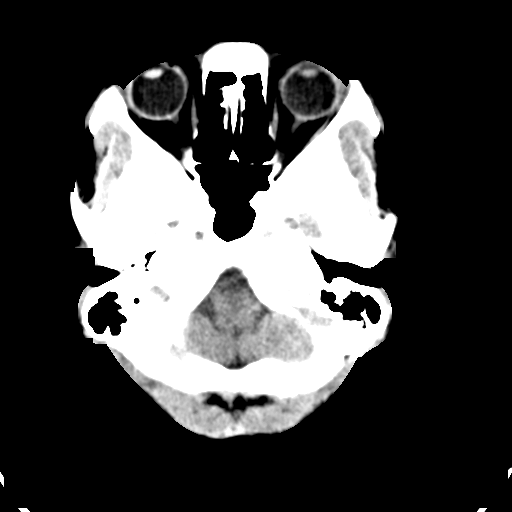
[im 4/30  bone]
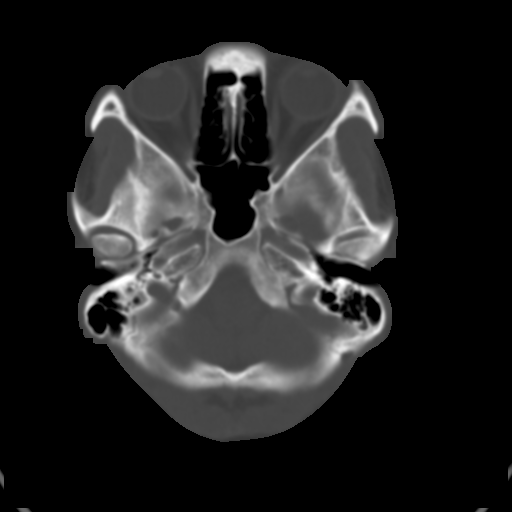
[im 8/30  brain]
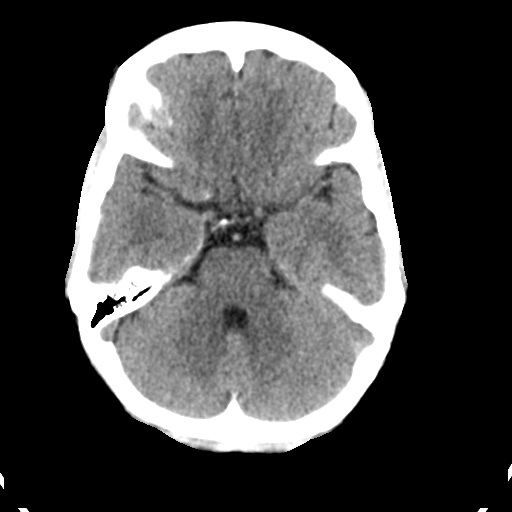
[im 11/30  brain]
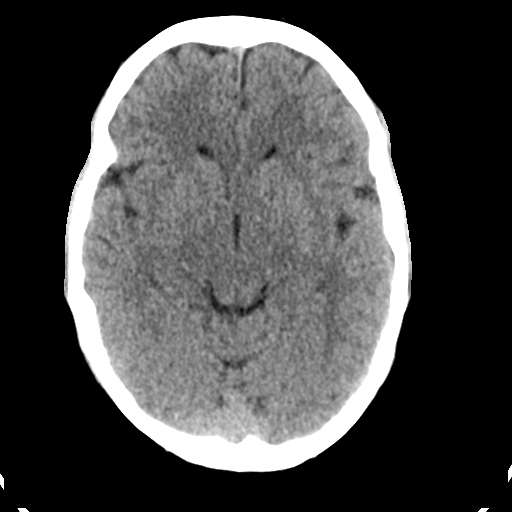
[im 15/30  brain]
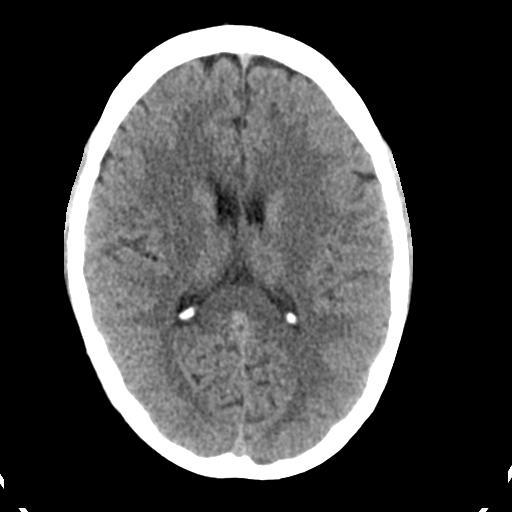
[im 19/30  brain]
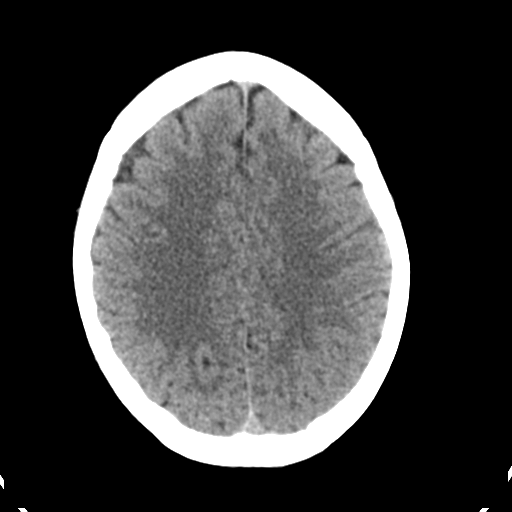
[im 19/30  bone]
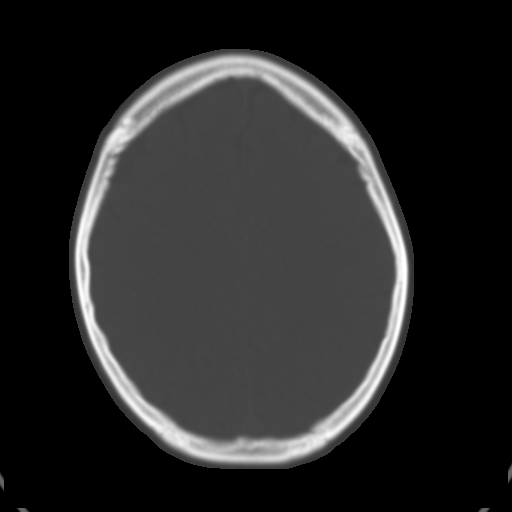
[im 22/30  brain]
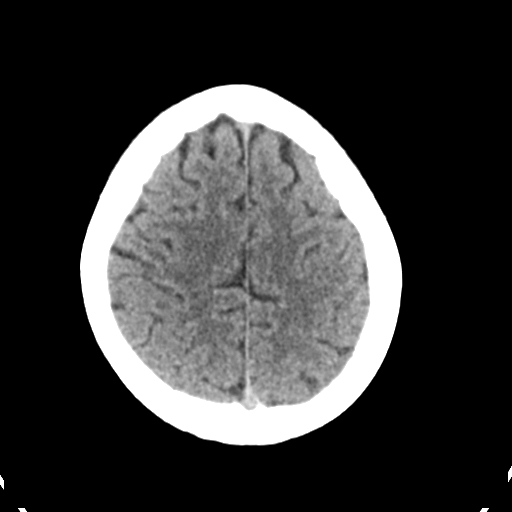
[im 26/30  brain]
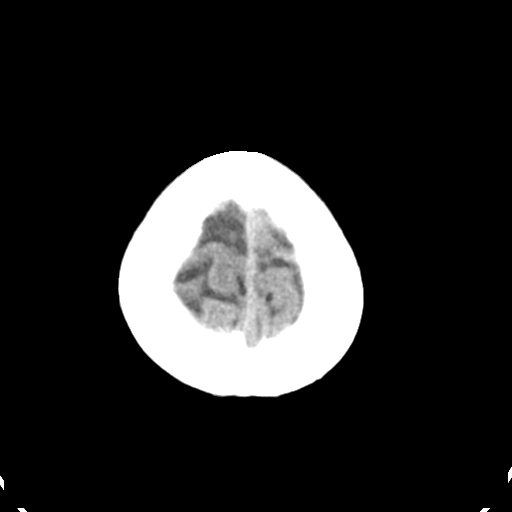

[Series 5: head without cor · coronal · non-contrast · 0.29mm/px · 3 of 67 slices shown]
[im 23/67  brain]
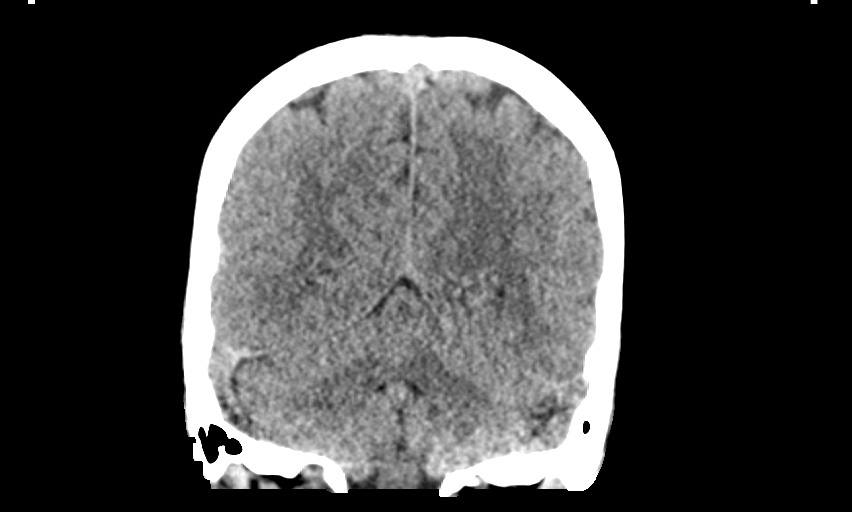
[im 30/67  brain]
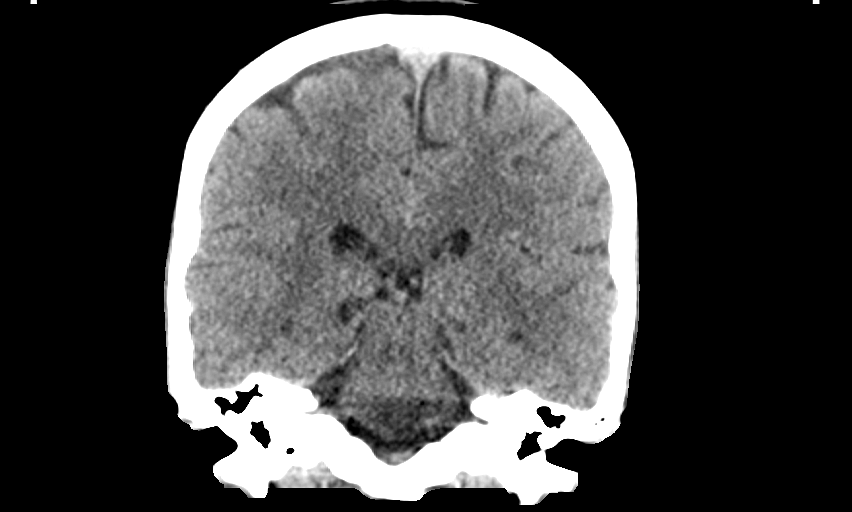
[im 37/67  brain]
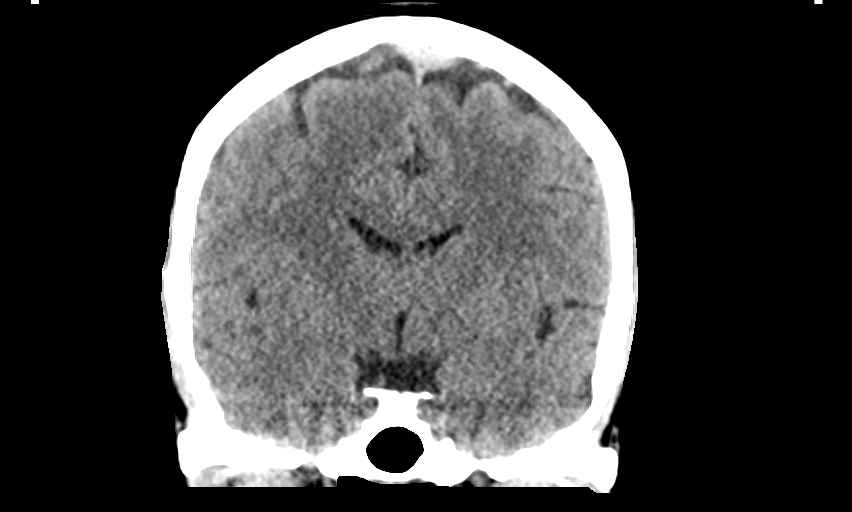

[Series 6: head without sag · sagittal · non-contrast · 0.29mm/px · 3 of 67 slices shown]
[im 23/67  brain]
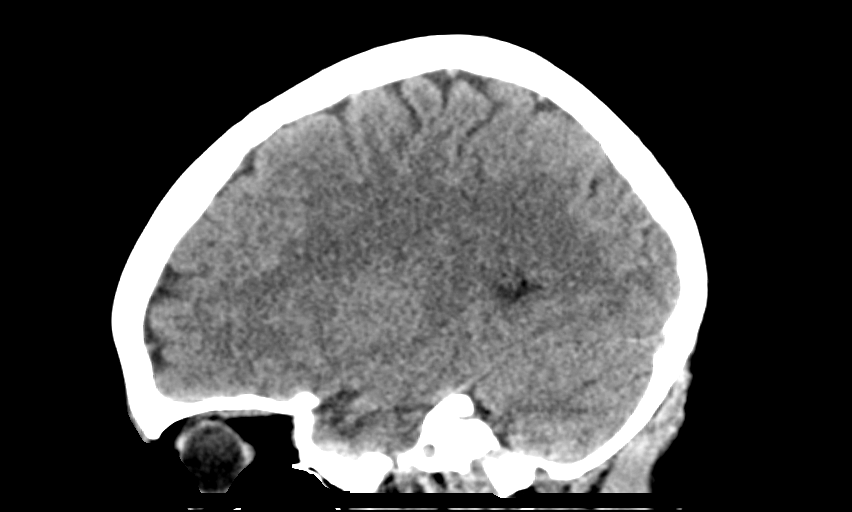
[im 34/67  brain]
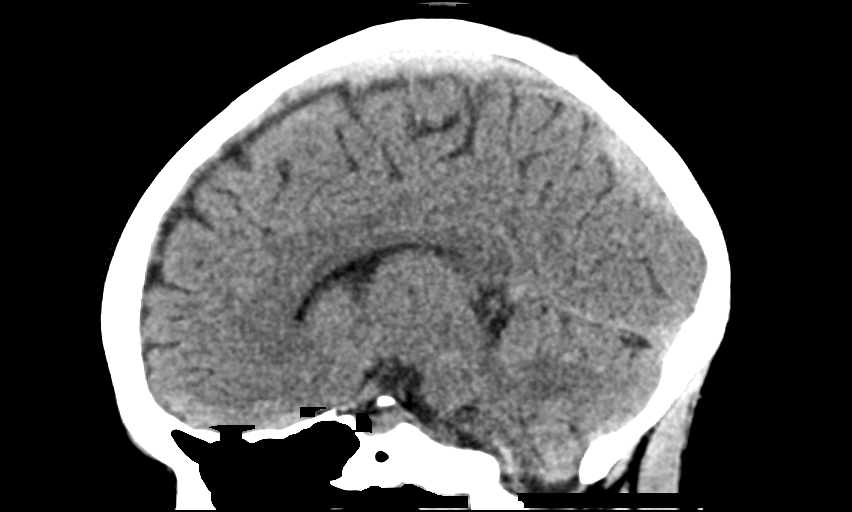
[im 45/67  brain]
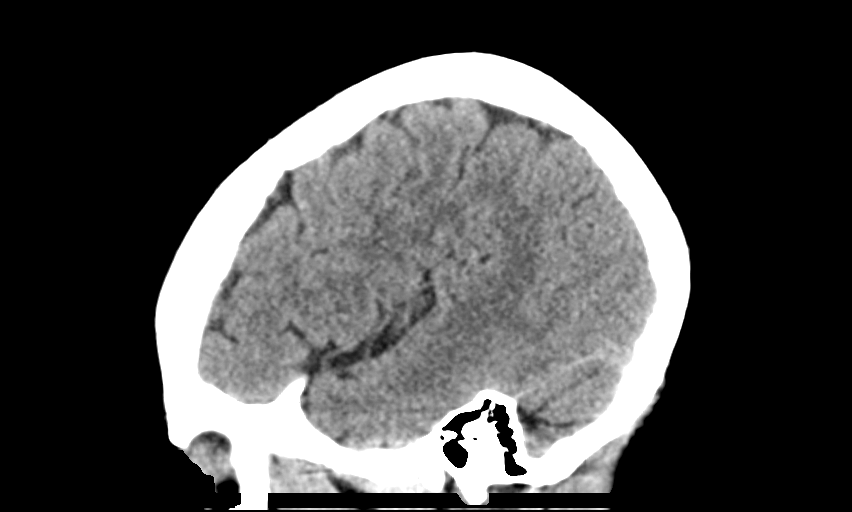

[13 of 47 positions shown; findings below may reference images not displayed]

FINDINGS: Brain: No evidence of acute infarction, hemorrhage, hydrocephalus,
extra-axial collection or mass lesion/mass effect.

Vascular: No hyperdense vessel or unexpected calcification.

Skull: Normal. Negative for fracture or focal lesion.

Sinuses/Orbits: No acute finding.

Other: None.
IMPRESSION: No acute intracranial process.

## 2023-03-06 ENCOUNTER — Ambulatory Visit: Payer: Medicaid Other | Admitting: Registered"

## 2023-03-13 ENCOUNTER — Other Ambulatory Visit: Payer: Medicaid Other | Admitting: Licensed Clinical Social Worker

## 2023-03-13 NOTE — Patient Outreach (Signed)
Medicaid Managed Care Social Work Note  03/13/2023 Name:  Kaitlyn Crane MRN:  [id number] DOB:  [birth year]  Kaitlyn Crane is an 27 y.o. year old female who is a primary patient of Olive Bass, FNP.  The Medicaid Managed Care Coordination team was consulted for assistance with:  Mental Health Counseling and Resources  Ms. Kaitlyn Crane was given information about Medicaid Managed Care Coordination team services today. Kaitlyn Crane {MMPATPARGUARDIANCONSENT:26157} agreed to services and verbal consent obtained.  Engaged with patient  for {MMTELEPHONEFACETOFACE:25045} for{MMINITIALFOLLOWUPCHOICE:25046} in response to referral for case management and/or care coordination services.   Assessments/Interventions:  Review of past medical history, allergies, medications, health status, including review of consultants reports, laboratory and other test data, was performed as part of comprehensive evaluation and provision of chronic care management services.  SDOH: (Social Determinant of Health) assessments and interventions performed: SDOH Interventions    Flowsheet Row Patient Outreach Telephone from 02/26/2023 in [place] POPULATION HEALTH DEPARTMENT Patient Outreach Telephone from 02/12/2023 in [place] POPULATION HEALTH DEPARTMENT Patient Outreach Telephone from 01/07/2023 in [place] POPULATION HEALTH DEPARTMENT Patient Outreach Telephone from 12/26/2022 in [place] POPULATION HEALTH DEPARTMENT Patient Outreach Telephone from 12/11/2022 in [place] POPULATION HEALTH DEPARTMENT Patient Outreach Telephone from 11/30/2022 in  POPULATION HEALTH DEPARTMENT  SDOH Interventions        Stress Interventions Offered Kaitlyn Crane, Provide Counseling  [crying spells this past week but in good spirits as of today] Kaitlyn Crane, Provide Counseling Offered Kaitlyn Crane, Provide Counseling Offered Kaitlyn Crane, Provide Counseling Offered  Kaitlyn Crane, Provide Counseling Offered Kaitlyn Crane, Provide Counseling       Advanced Directives Status:  {Advanced Directives Status:25048}  Care Plan                 Allergies  Allergen Reactions   Shellfish Allergy Other (See Comments)    Swelling of tonsils   Carbamide Peroxide Rash   Nickel Sulfate [Nickel] Rash    Medications Reviewed Today     Reviewed by Henrene Pastor, RPH-CPP (Pharmacist) on 02/14/23 at 1251  Med List Status: <None>   Medication Order Taking? Sig Documenting Provider Last Dose Status Informant  buPROPion (WELLBUTRIN) 75 MG tablet [id number] Yes Take 75 mg by mouth in the morning. [provider] Taking Active   cromolyn (OPTICROM) 4 % ophthalmic solution [id number] No Place 1 drop into both eyes 4 (four) times daily as needed.  Patient not taking: Reported on 02/14/2023   Marcelyn Bruins, MD Not Taking Active   EPIPEN 2-PAK 0.3 MG/0.3ML SOAJ injection [id number]  Inject 0.3 mg into the muscle as needed for anaphylaxis. Kaitlyn Dana, NP  Active   feeding supplement (ENSURE ENLIVE / ENSURE PLUS) LIQD [id number] No Take 237 mLs by mouth 3 (three) times daily between meals.  Patient not taking: Reported on 02/14/2023   [provider] Not Taking Active   fluticasone (FLONASE) 50 MCG/ACT nasal spray [id number] No Place 2 sprays into both nostrils daily as needed for allergies or rhinitis.  Patient not taking: Reported on 02/14/2023   Marcelyn Bruins, MD Not Taking Active   lamoTRIgine (LAMICTAL) 25 MG tablet [id number] No Take 25 mg by mouth daily.  Patient not taking: Reported on 02/14/2023   [provider] Not Taking Active   levocetirizine (XYZAL) 5 MG tablet [id number] Yes Take 1 tablet (5 mg total) by mouth daily as needed. Marcelyn Bruins, MD Taking Active   levonorgestrel [facility],  52 MG,) 20 MCG/DAY IUD [id number] Yes used office stock [provider] Taking Active    lurasidone (LATUDA) 40 MG TABS tablet [id number] Yes Take 1 tablet by mouth daily. [provider] Taking Active   Multiple Vitamin (MULTIVITAMIN WITH MINERALS) TABS tablet [id number] No Take 1 tablet by mouth daily.  Patient not taking: Reported on 02/14/2023   Kaitlyn Ro, MD Not Taking Active   omeprazole (PRILOSEC) 20 MG capsule [id number] Yes Take 20 mg by mouth 2 (two) times daily before a meal. [provider] Taking Active   sodium chloride (OCEAN) 0.65 % SOLN nasal spray [id number] Yes Place 1 spray into both nostrils as needed for congestion. [provider] Taking Active   traZODone (DESYREL) 50 MG tablet [id number] Yes Take 50 mg by mouth at bedtime. [provider]  Active            Med Note Clydie Braun, Cindie Laroche Feb 14, 2023 12:50 PM) Not currently taking but per patient Dr Dot Splinter Dare told her she could start if needed for sleep  triamcinolone ointment (KENALOG) 0.1 % [id number] Yes Apply sparingly to affected areas twice daily as needed below face and neck Marcelyn Bruins, MD Taking Active   VENTOLIN HFA 108 (90 Base) MCG/ACT inhaler [id number] Yes TAKE 2 PUFFS BY MOUTH EVERY 6 HOURS AS NEEDED FOR WHEEZE OR SHORTNESS OF BREATH Marcelyn Bruins, MD Taking Active   vitamin B-12 1000 MCG tablet [id number] No Take 1 tablet (1,000 mcg total) by mouth daily.  Patient not taking: Reported on 02/14/2023   Kaitlyn Ro, MD Not Taking Active   Vitamin D, Ergocalciferol, (DRISDOL) 1.25 MG (50000 UNIT) CAPS capsule [id number] Yes Take 50,000 Units by mouth every 7 (seven) days. 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   Encounter for insertion of mirena IUD - inserted 12/12/12 12/12/2012   SVD (spontaneous vaginal delivery) 09/23/2012   Chorioamnionitis 09/23/2012   Late prenatal care 08/27/2012   Normal pregnancy, first 08/11/2012   Anemia 09/17/2011    Conditions to be addressed/monitored per PCP order:  {CCM ASSESSMENT DZ OPTIONS:25047}  Care Plan : LCSW Plan of Care  Updates made by Gustavus Bryant, LCSW since 03/13/2023 12:00 AM     Problem: Depression Identification (Depression)      Long-Range Goal: Depressive Symptoms Identified   Start Date: 09/25/2022  Note:    Priority: High  Timeframe:  Long-Range Goal Priority:  High Start Date:   09/25/22            Expected End Date:  ongoing                     Follow Up Date--04/12/23 at 2:00  - keep 90 percent of scheduled appointments -consider counseling or psychiatry -consider bumping up your self-care  -consider creating a stronger support network   Why is this important?  Combatting depression may take some time.            If you don't feel better right away, don't give up on your treatment plan.    Current barriers:   Chronic Mental Health needs related to bipolar, depression and stress. Patient requires Support, Education, [place], Referrals, Advocacy, and Care Coordination, in order to meet Unmet Mental Health Needs. Patient will implement clinical interventions discussed today to decrease symptoms of depression and increase knowledge and/or ability of: coping skills. Mental Health Concerns and Social Isolation Patient lacks knowledge of available Kaitlyn counseling agencies and [place]. Lack of a stable support network  Clinical Goal(s): verbalize understanding of plan for management of Bipolar, Depression, and Stress and demonstrate a  reduction in symptoms. Patient will connect with a provider for ongoing mental health treatment, increase coping skills, healthy habits, self-management skills, and stress reduction        Clinical Interventions:  Assessed patient's previous and current treatment, coping skills, support system and barriers to care. Patient provided hx  Verbalization of feelings encouraged, motivational interviewing employed Emotional support provided, positive coping strategies explored. Establishing healthy boundaries to use with family emphasized and healthy self-care education provided. Patient has had issues in the past with family interfering with her mental health care.  Patient was educated on available mental health [facility] within their area that accept Medicaid and offer counseling and psychiatry. Email sent to patient today with instructions for scheduling at [facility] as well as some crisis support [facility] and [facility]'s walk in clinic hours Patient reports that she wishes to change her mental health providers from [facility] to [facility]. Patient reports that her niece is a strong and stable support for her.  Patient is agreeable to referral to [facility] for counseling and psychiatry. [facility] LCSW made referral on 09/25/22. LCSW provided education on relaxation techniques such as meditation, deep breathing, massage, grounding exercises or yoga that can activate the body's relaxation response and ease symptoms of stress and anxiety. LCSW ask that when pt is struggling with difficult emotions and racing thoughts that they start this relaxation response process. LCSW provided extensive education on healthy coping skills for anxiety. SW used active and reflective listening, validated patient's feelings/concerns, and provided emotional support. Patient will work on implementing appropriate self-care habits into their daily routine such as: staying positive, writing a gratitude list, drinking water, staying active around the  house, taking their medications as prescribed, combating negative thoughts or emotions and staying connected with their family and friends. Positive reinforcement provided for this decision to work on this. Past update- Patient reports that her PCP made a new referral to [facility] and they contacted her but she is not sure if her insurance is in their network and will be contacting her insurance provider to inquire. Patient reports that she has started group therapy at the [facility] and also an Emotional Wellness Group at [employer]. She also signed up for a work training program at [employer] that will provide two weeks of hands on training before setting her up with new employment. Patient also has her initial intake appointment set up with [facility] that [facility] LCSW referred her to but this appointment isn't until next month. 10/25/22 UPDATE- [facility] LCSW talked to patient while she was in the waiting room at [facility] to see a psychiatrist. She already has completed her initial counseling session there and reports feeling comfortable with her current mental health care team. [facility] LCSW will follow up later this  month. 11/30/22 UPDATE- Patient reports that she wishes to continue psychiatry services at [facility] and continue counseling at [facility] with counselor Idalia Needle. She reports that she is on a new medication that she is experiencing crying spells as a side effect. Patient was advised to contact her psychiatrist before her appointment next week to update her provider on this negative side effects. Crisis resource education provided. LCSW 12/11/22 update- Patient reports that she did NOT stop the medication that her psychiatrist prescribed that were causing her adverse side effects but that she did call and schedule for a follow up appointment which is tomorrow. This appointment is virtual. She reports that she has decided to pursue counseling elsewhere after talking with her counseling at [facility] and a  recommendation was made for her to follow up with. She reports that this recommendation provides specialized therapy services that she is interested in but she has not taken the time to reach out to this resource yet but will do so this week. [facility] LCSW will follow up next month. [facility] LCSW 12/26/22 Update- Patient reports that she has an upcoming psychiatry appointment next week but sent an email to her psychiatrist asking for an earlier appointment due to experiencing ongoing adverse side effects of her current medication that the psychiatrist prescribed. Patient reports that she is currently getting enrolled with [facility] in order to start EMDR therapy. Positive reinforcement provided for her ongoing active self-care implementation.  Patient reports that they experience both anxiety and depression.She shares that he feels alone most days. She was receptive to anxiety and depression management coping skill education. UPDATE- Patient reports that her main stressor at this time is financially related. She reports that financial strain lately has made her unable to get out of the bed. Referral made for [facility] BSW for financial support and resource connection/education. 02/12/23 LCSW update- Patient reports need for pharmacy assistance and services other than what her pharmacy can provide to her regarding her journey with finding the right mental health medication combination and her trial and error process. Patient had a really bad skin reaction to lamictal and is now on latuda and her psychiatrist is now suggesting wellbutrin and has ordered this medication to assist with patient's depression. [facility] LCSW will ask [facility] team regarding need for specific pharmacy assistance/education and support services regarding side effect of medications. Emotional support, self-care and coping tool education provided. Update- Patient reports improvement in mood and adjustment to new psychiatry medications. She reports  that she does experience some crying spells but today is feeling motivated and positive. Patient reports that she has been avoiding contacting her peer support specialist from [facility] due to shame. [facility] LCSW implemented CBT intervention and patient was able to change her thought process and negative narrative regarding her sense of self. Patient was able to acknowledge all of the success she has actively made towards reaching her mental health needs. Positive reinforcement and emotional encouragement provided to patient as she does put into work and has been able to positively change and impact her life even throughout recent difficulties and challenges (specifically regarding medication trials and errors.) [facility] LCSW will follow up in two weeks. Update- Patient reports being stable. She is aware of the 39 crisis support resource and [facility] walk in hours in case she were to have any urgent mental health needs in between visits with her long term providers. Patient reports ongoing issues with getting Wellbutrin medication. [facility] LCSW will send message to Pharmacist. [facility] LCSW  provided extensive education on how to deal with her avoidance as this has contributed to her isolation and hesitation with reaching out for support. [facility] LCSW will be complete a case closure next month as her mental health needs are being met by her counselor and psychiatrist. Patient expressed understanding to this plan as well as expressed appreciation for all of the support that has been given to her by [facility] team.  The following coping skill education was provided for stress relief and mental health management: "When your car dies or a deadline looms, how do you respond? Long-term, low-grade or acute stress takes a serious toll on your body and mind, so don't ignore feelings of constant tension. Stress is a natural part of life. However, too much stress can harm our health, especially if it continues every day. This is chronic stress and  can put you at risk for heart problems like heart disease and depression. Understand what's happening inside your body and learn simple coping skills to combat the negative impacts of everyday stressors.  Types of Stress There are two types of stress: Emotional - types of emotional stress are relationship problems, pressure at work, financial worries, experiencing discrimination or having a major life change. Physical - Examples of physical stress include being sick having pain, not sleeping well, recovery from an injury or having an alcohol and drug use disorder. Fight or Flight Sudden or ongoing stress activates your nervous system and floods your bloodstream with adrenaline and cortisol, two hormones that raise blood pressure, increase heart rate and spike blood sugar. These changes pitch your body into a fight or flight response. That enabled our ancestors to outrun saber-toothed tigers, and it's helpful today for situations like dodging a car accident. But most modern chronic stressors, such as finances or a challenging relationship, keep your body in that heightened state, which hurts your health. Effects of Too Much Stress If constantly under stress, most of [place] will eventually start to function less well.  Multiple studies link chronic stress to a higher risk of heart disease, stroke, depression, weight gain, memory loss and even premature death, so it's important to recognize the warning signals. Talk to your doctor about ways to manage stress if you're experiencing any of these symptoms: Prolonged periods of poor sleep. Regular, severe headaches. Unexplained weight loss or gain. Feelings of isolation, withdrawal or worthlessness. Constant anger and irritability. Loss of interest in activities. Constant worrying or obsessive thinking. Excessive alcohol or drug use. Inability to concentrate.  10 Ways to Cope with Chronic Stress It's key to recognize stressful situations as they occur  because it allows you to focus on managing how you react. We all need to know when to close our eyes and take a deep breath when we feel tension rising. Use these tips to prevent or reduce chronic stress. 1. Rebalance Work and Home All work and no play? If you're spending too much time at the office, intentionally put more dates in your calendar to enjoy time for fun, either alone or with others. 2. Get Regular Exercise Moving your body on a regular basis balances the nervous system and increases blood circulation, helping to flush out stress hormones. Even a daily 20-minute walk makes a difference. Any kind of exercise can lower stress and improve your mood ? just pick activities that you enjoy and make it a regular habit. 3. Eat Well and Limit Alcohol and Stimulants Alcohol, nicotine and caffeine may temporarily relieve stress but have negative health  impacts and can make stress worse in the long run. Well-nourished bodies cope better, so start with a good breakfast, add more organic fruits and vegetables for a well-balanced diet, avoid processed foods and sugar, try herbal tea and drink more water. 4. Connect with Supportive People Talking face to face with another person releases hormones that reduce stress. Lean on those good listeners in your life. 5. Carve Out Hobby Time Do you enjoy gardening, reading, listening to music or some other creative pursuit? Engage in activities that bring you pleasure and joy; research shows that reduces stress by almost half and lowers your heart rate, too. 6. Practice Meditation, Stress Reduction or Yoga Relaxation techniques activate a state of restfulness that counterbalances your body's fight-or-flight hormones. Even if this also means a 10-minute break in a long day: listen to music, read, go for a walk in nature, do a hobby, take a bath or spend time with a friend. Also consider doing a mindfulness exercise or try a daily deep breathing or imagery  practice. Deep Breathing Slow, calm and deep breathing can help you relax. Try these steps to focus on your breathing and repeat as needed. Find a comfortable position and close your eyes. Exhale and drop your shoulders. Breathe in through your nose; fill your lungs and then your belly. Think of relaxing your body, quieting your mind and becoming calm and peaceful. Breathe out slowly through your nose, relaxing your belly. Think of releasing tension, pain, worries or distress. Repeat steps three and four until you feel relaxed. Imagery This involves using your mind to excite the senses -- sound, vision, smell, taste and feeling. This may help ease your stress. Begin by getting comfortable and then do some slow breathing. Imagine a place you love being at. It could be somewhere from your childhood, somewhere you vacationed or just a place in your imagination. Feel how it is to be in the place you're imagining. Pay attention to the sounds, air, colors, and who is there with you. This is a place where you feel cared for and loved. All is well. You are safe. Take in all the smells, sounds, tastes and feelings. As you do, feel your body being nourished and healed. Feel the calm that surrounds you. Breathe in all the good. Breathe out any discomfort or tension. 7. Sleep Enough If you get less than seven to eight hours of sleep, your body won't tolerate stress as well as it could. If stress keeps you up at night, address the cause, and add extra meditation into your day to make up for the lost z's. Try to get seven to nine hours of sleep each night. Make a regular bedtime schedule. Keep your room dark and cool. Try to avoid computers, TV, cell phones and tablets before bed. 8. Bond with Connections You Enjoy Go out for a coffee with a friend, chat with a neighbor, call a family member, visit with a clergy member, or even hang out with your pet. Clinical studies show that spending even a short time with a  companion animal can cut anxiety levels almost in half. 9. Take a Vacation Getting away from it all can reset your stress tolerance by increasing your mental and emotional outlook, which makes you a happier, more productive person upon return. Leave your cellphone and laptop at home! 10. See a Counselor, Coach or Therapist If negative thoughts overwhelm your ability to make positive changes, it's time to seek professional help. Make an appointment today--your  health and life are worth it."   Depression screen reviewed  PHQ2/ PHQ9 completed or reviewed  Mindfulness or Relaxation training provided Active listening / Reflection utilized  Advance Care and HCPOA education provided Emotional Support Provided Problem Solving /Task Center strategies reviewed Provided psychoeducation for mental health needs  Provided brief CBT  Reviewed mental health medications and discussed importance of compliance:  Quality of sleep assessed & Sleep Hygiene techniques promoted  Participation in counseling encouraged  Verbalization of feelings encouraged  Suicidal Ideation/Homicidal Ideation assessed: Patient denies SI/HI  Review Cr[facility], discussed options and provided patient information about  Mental Health [place] Inter-disciplinary care team collaboration (see longitudinal plan of care)  Patient Goals/Self-Care Activities: Over the next 120 days Attend scheduled medical appointments Utilize healthy coping skills and supportive [place] discussed Contact PCP with any questions or concerns Keep 90 percent of counseling appointments Call your insurance provider for more information about your Enhanced Benefits  Check out counseling [place] provided  Begin personal counseling with LCSW, to reduce and manage symptoms of Depression and Stress, until well-established with mental health provider Accept all calls from representative with [facility] in an effort to establish ongoing mental health counseling and  supportive services. Incorporate into daily practice - relaxation techniques, deep breathing exercises, and mindfulness meditation strategies. Talk about feelings with friends, family members, spiritual advisor, etc. Contact LCSW directly [phone]), if you have questions, need assistance, or if additional social work needs are identified between now and our next scheduled telephone outreach call. Call 988 for mental health hotline/crisis line if needed (24/7 available) Try techniques to reduce symptoms of anxiety/negative thinking (deep breathing, distraction, positive self talk, etc)  - develop a personal safety plan - develop a plan to deal with triggers like holidays, anniversaries - exercise at least 2 to 3 times per week - have a plan for how to handle bad days - journal feelings and what helps to feel better or worse - spend time or talk with others at least 2 to 3 times per week - watch for early signs of feeling worse - begin personal counseling - call and visit an old friend - check out volunteer opportunities - join a support group - laugh; watch a funny movie or comedian - learn and use visualization or guided imagery - perform a random act of kindness - practice relaxation or meditation daily - start or continue a personal journal - practice positive thinking and self-talk -continue with compliance of taking medication  -identify current effective and ineffective coping strategies.  -implement positive self-talk in care to increase self-esteem, confidence and feelings of control.  -consider alternative and complementary therapy approaches such as meditation, mindfulness or yoga.  -journaling, prayer, worship services, meditation or pastoral counseling.  -increase participation in pleasurable group activities such as hobbies, singing, sports or volunteering).  -consider the use of meditative movement therapy such as tai chi, yoga or qigong.  -start a regular daily  exercise program based on tolerance, ability and patient choice to support positive thinking and activity    If you are experiencing a Mental Health or Behavioral Health Crisis or need someone to talk to, please call the Suicide and Crisis Lifeline: 988    Patient Goals: Follow up goal      Follow up:  {FOLLOWUP:24991}  Plan: {MANAGED MEDICAID FOLLOW UP GNFA:21308}  Date/time of next scheduled Social Work care management/care coordination outreach:  ***  ***

## 2023-03-13 NOTE — Patient Instructions (Signed)
Visit Information  Thank you for speaking with me today Taleeya. Remember your worth and all of the progress that you have made thus far!!   Ms. Mcburney was given information about Medicaid Managed Care team care coordination services as a part of their [facility] Community Plan Medicaid benefit. Evie Easom verbally consented to engagement with the [facility] Managed Care team.   If you are experiencing a medical emergency, please call 911 or report to your local emergency department or urgent care.   If you have a non-emergency medical problem during routine business hours, please contact your provider's office and ask to speak with a nurse.   For questions related to your [facility], please call: [phone] or visit the homepage here: [email or web address]  If you would like to schedule transportation through your [employer] please call the following number at least 2 days in advance of your appointment: [phone]   Rides for urgent appointments can also be made after hours by calling Member Services.  Call the Behavioral Health Crisis Line at [phone], at any time, 24 hours a day, 7 days a week. If you are in danger or need immediate medical attention call 911.  If you would like help to quit smoking, call 1-800-QUIT-NOW (289-453-1574) OR Espaol: 1-855-Djelo-Ya (1-324-401-0272) o para ms informacin haga clic aqu or Text READY to 536-644 to register via text  Following is a copy of your plan of care:  Care Plan : LCSW Plan of Care  Updates made by Gustavus Bryant, LCSW since 03/13/2023 12:00 AM     Problem: Depression Identification (Depression)      Long-Range Goal: Depressive Symptoms Identified   Start Date: 09/25/2022  Note:    Priority: High  Timeframe:  Long-Range Goal Priority:  High Start Date:   09/25/22            Expected End Date:  ongoing                      Follow Up Date--04/12/23 at 2:00  - keep 90 percent of scheduled appointments -consider counseling or psychiatry -consider bumping up your self-care  -consider creating a stronger support network   Why is this important?             Combatting depression may take some time.            If you don't feel better right away, don't give up on your treatment plan.    Current barriers:   Chronic Mental Health needs related to bipolar, depression and stress. Patient requires Support, Education, Resources, Referrals, Advocacy, and Care Coordination, in order to meet Unmet Mental Health Needs. Patient will implement clinical interventions discussed today to decrease symptoms of depression and increase knowledge and/or ability of: coping skills. Mental Health Concerns and Social Isolation Patient lacks knowledge of available community counseling agencies and resources. Lack of a stable support network  Clinical Goal(s): verbalize understanding of plan for management of Bipolar, Depression, and Stress and demonstrate a reduction in symptoms. Patient will connect with a provider for ongoing mental health treatment, increase coping skills, healthy habits, self-management skills, and stress reduction        The following coping skill education was provided for stress relief and mental health management: "When your car dies or a deadline looms, how do you respond? Long-term, low-grade or acute stress takes a serious toll on your body and mind, so don't ignore feelings of  constant tension. Stress is a natural part of life. However, too much stress can harm our health, especially if it continues every day. This is chronic stress and can put you at risk for heart problems like heart disease and depression. Understand what's happening inside your body and learn simple coping skills to combat the negative impacts of everyday stressors.  Types of Stress There are two types of stress: Emotional - types  of emotional stress are relationship problems, pressure at work, financial worries, experiencing discrimination or having a major life change. Physical - Examples of physical stress include being sick having pain, not sleeping well, recovery from an injury or having an alcohol and drug use disorder. Fight or Flight Sudden or ongoing stress activates your nervous system and floods your bloodstream with adrenaline and cortisol, two hormones that raise blood pressure, increase heart rate and spike blood sugar. These changes pitch your body into a fight or flight response. That enabled our ancestors to outrun saber-toothed tigers, and it's helpful today for situations like dodging a car accident. But most modern chronic stressors, such as finances or a challenging relationship, keep your body in that heightened state, which hurts your health. Effects of Too Much Stress If constantly under stress, most of [place] will eventually start to function less well.  Multiple studies link chronic stress to a higher risk of heart disease, stroke, depression, weight gain, memory loss and even premature death, so it's important to recognize the warning signals. Talk to your doctor about ways to manage stress if you're experiencing any of these symptoms: Prolonged periods of poor sleep. Regular, severe headaches. Unexplained weight loss or gain. Feelings of isolation, withdrawal or worthlessness. Constant anger and irritability. Loss of interest in activities. Constant worrying or obsessive thinking. Excessive alcohol or drug use. Inability to concentrate.  10 Ways to Cope with Chronic Stress It's key to recognize stressful situations as they occur because it allows you to focus on managing how you react. We all need to know when to close our eyes and take a deep breath when we feel tension rising. Use these tips to prevent or reduce chronic stress. 1. Rebalance Work and Home All work and no play? If you're spending  too much time at the office, intentionally put more dates in your calendar to enjoy time for fun, either alone or with others. 2. Get Regular Exercise Moving your body on a regular basis balances the nervous system and increases blood circulation, helping to flush out stress hormones. Even a daily 20-minute walk makes a difference. Any kind of exercise can lower stress and improve your mood ? just pick activities that you enjoy and make it a regular habit. 3. Eat Well and Limit Alcohol and Stimulants Alcohol, nicotine and caffeine may temporarily relieve stress but have negative health impacts and can make stress worse in the long run. Well-nourished bodies cope better, so start with a good breakfast, add more organic fruits and vegetables for a well-balanced diet, avoid processed foods and sugar, try herbal tea and drink more water. 4. Connect with Supportive People Talking face to face with another person releases hormones that reduce stress. Lean on those good listeners in your life. 5. Carve Out Hobby Time Do you enjoy gardening, reading, listening to music or some other creative pursuit? Engage in activities that bring you pleasure and joy; research shows that reduces stress by almost half and lowers your heart rate, too. 6. Practice Meditation, Stress Reduction or Yoga Relaxation techniques activate  a state of restfulness that counterbalances your body's fight-or-flight hormones. Even if this also means a 10-minute break in a long day: listen to music, read, go for a walk in nature, do a hobby, take a bath or spend time with a friend. Also consider doing a mindfulness exercise or try a daily deep breathing or imagery practice. Deep Breathing Slow, calm and deep breathing can help you relax. Try these steps to focus on your breathing and repeat as needed. Find a comfortable position and close your eyes. Exhale and drop your shoulders. Breathe in through your nose; fill your lungs and then your  belly. Think of relaxing your body, quieting your mind and becoming calm and peaceful. Breathe out slowly through your nose, relaxing your belly. Think of releasing tension, pain, worries or distress. Repeat steps three and four until you feel relaxed. Imagery This involves using your mind to excite the senses -- sound, vision, smell, taste and feeling. This may help ease your stress. Begin by getting comfortable and then do some slow breathing. Imagine a place you love being at. It could be somewhere from your childhood, somewhere you vacationed or just a place in your imagination. Feel how it is to be in the place you're imagining. Pay attention to the sounds, air, colors, and who is there with you. This is a place where you feel cared for and loved. All is well. You are safe. Take in all the smells, sounds, tastes and feelings. As you do, feel your body being nourished and healed. Feel the calm that surrounds you. Breathe in all the good. Breathe out any discomfort or tension. 7. Sleep Enough If you get less than seven to eight hours of sleep, your body won't tolerate stress as well as it could. If stress keeps you up at night, address the cause, and add extra meditation into your day to make up for the lost z's. Try to get seven to nine hours of sleep each night. Make a regular bedtime schedule. Keep your room dark and cool. Try to avoid computers, TV, cell phones and tablets before bed. 8. Bond with Connections You Enjoy Go out for a coffee with a friend, chat with a neighbor, call a family member, visit with a clergy member, or even hang out with your pet. Clinical studies show that spending even a short time with a companion animal can cut anxiety levels almost in half. 9. Take a Vacation Getting away from it all can reset your stress tolerance by increasing your mental and emotional outlook, which makes you a happier, more productive person upon return. Leave your cellphone and laptop at  home! 10. See a Counselor, Coach or Therapist If negative thoughts overwhelm your ability to make positive changes, it's time to seek professional help. Make an appointment today--your health and life are worth it."   Depression screen reviewed  PHQ2/ PHQ9 completed or reviewed  Mindfulness or Relaxation training provided Active listening / Reflection utilized  Advance Care and HCPOA education provided Emotional Support Provided Problem Solving /Task Center strategies reviewed Provided psychoeducation for mental health needs  Provided brief CBT  Reviewed mental health medications and discussed importance of compliance:  Quality of sleep assessed & Sleep Hygiene techniques promoted  Participation in counseling encouraged  Verbalization of feelings encouraged  Suicidal Ideation/Homicidal Ideation assessed: Patient denies SI/HI  Review resources, discussed options and provided patient information about  Mental Health Resources Inter-disciplinary care team collaboration (see longitudinal plan of care)  Patient Goals/Self-Care  Activities: Over the next 120 days Attend scheduled medical appointments Utilize healthy coping skills and supportive resources discussed Contact PCP with any questions or concerns Keep 90 percent of counseling appointments Call your insurance provider for more information about your Enhanced Benefits  Check out counseling resources provided  Begin personal counseling with LCSW, to reduce and manage symptoms of Depression and Stress, until well-established with mental health provider Accept all calls from representative with [facility] in an effort to establish ongoing mental health counseling and supportive services. Incorporate into daily practice - relaxation techniques, deep breathing exercises, and mindfulness meditation strategies. Talk about feelings with friends, family members, spiritual advisor, etc. Contact LCSW directly [phone]), if you have  questions, need assistance, or if additional social work needs are identified between now and our next scheduled telephone outreach call. Call 988 for mental health hotline/crisis line if needed (24/7 available) Try techniques to reduce symptoms of anxiety/negative thinking (deep breathing, distraction, positive self talk, etc)  - develop a personal safety plan - develop a plan to deal with triggers like holidays, anniversaries - exercise at least 2 to 3 times per week - have a plan for how to handle bad days - journal feelings and what helps to feel better or worse - spend time or talk with others at least 2 to 3 times per week - watch for early signs of feeling worse - begin personal counseling - call and visit an old friend - check out volunteer opportunities - join a support group - laugh; watch a funny movie or comedian - learn and use visualization or guided imagery - perform a random act of kindness - practice relaxation or meditation daily - start or continue a personal journal - practice positive thinking and self-talk -continue with compliance of taking medication  -identify current effective and ineffective coping strategies.  -implement positive self-talk in care to increase self-esteem, confidence and feelings of control.  -consider alternative and complementary therapy approaches such as meditation, mindfulness or yoga.  -journaling, prayer, worship services, meditation or pastoral counseling.  -increase participation in pleasurable group activities such as hobbies, singing, sports or volunteering).  -consider the use of meditative movement therapy such as tai chi, yoga or qigong.  -start a regular daily exercise program based on tolerance, ability and patient choice to support positive thinking and activity    If you are experiencing a Mental Health or Behavioral Health Crisis or need someone to talk to, please call the Suicide and Crisis Lifeline: 988    10 LITTLE  Things To Do When You're Feeling Too Down To Do Anything  Take a shower. Even if you plan to stay in all day long and not see a soul, take a shower. It takes the most effort to hop in to the shower but once you do, you'll feel immediate results. It will wake you up and you'll be feeling much fresher (and cleaner too).  Brush and floss your teeth. Give your teeth a good brushing with a floss finish. It's a small task but it feels so good and you can check 'taking care of your health' off the list of things to do.  Do something small on your list. Most of [place] have some small thing we would like to get done (load of laundry, sew a button, email a friend). Doing one of these things will make you feel like you've accomplished something.  Drink water. Drinking water is easy right? It's also really beneficial for your health so keep  a glass beside you all day and take sips often. It gives you energy and prevents you from boredom eating.  Do some floor exercises. The last thing you want to do is exercise but it might be just the thing you need the most. Keep it simple and do exercises that involve sitting or laying on the floor. Even the smallest of exercises release chemicals in the brain that make you feel good. Yoga stretches or core exercises are going to make you feel good with minimal effort.  Make your bed. Making your bed takes a few minutes but it's productive and you'll feel relieved when it's done. An unmade bed is a huge visual reminder that you're having an unproductive day. Do it and consider it your housework for the day.  Put on some nice clothes. Take the sweatpants off even if you don't plan to go anywhere. Put on clothes that make you feel good. Take a look in the mirror so your brain recognizes the sweatpants have been replaced with clothes that make you look great. It's an instant confidence booster.  Wash the dishes. A pile of dirty dishes in the sink is a reflection of your  mood. It's possible that if you wash up the dishes, your mood will follow suit. It's worth a try.  Cook a real meal. If you have the luxury to have a "do nothing" day, you have time to make a real meal for yourself. Make a meal that you love to eat. The process is good to get you out of the funk and the food will ensure you have more energy for tomorrow.  Write out your thoughts by hand. When you hand write, you stimulate your brain to focus on the moment that you're in so make yourself comfortable and write whatever comes into your mind. Put those thoughts out on paper so they stop spinning around in your head. Those thoughts might be the very thing holding you down.     24- Hour Availability:    [facility]  [address] [place], [place] [phone] Crisis [phone]   [facility] [phone]  [place] [facility]  [phone]    [facility]  [phone] (after hours)   Therapeutic Alternative/Mobile Crisis   [phone]   [place] National Suicide Hotline  860-680-9874 Len Childs) [place] 062   Call [phone] for mental health emergencies   [facility]  [phone]);  [place] and [employer]  [phone]); [place], [place], [place], [place], [place], [place], [place]    [place] [facility] for [facility] Residents For 24/7 walk-up access to mental health services for [facility] children (4+), adolescents and adults, please visit the [facility] located at [address] in [place], [place].  *[facility] also provides comprehensive outpatient behavioral health services in a variety of locations around the [place].  Connect With [place] [address] [place], [place] [address] HelpLine: (850)294-6064 or 1-657-138-9970  Get Directions  Find Help 24/7 By Phone Call our 24-hour HelpLine at 972-502-2106 or  705-452-8343 for immediate assistance for mental health and substance abuse issues.  Walk-In Help [place]: [facility] (Ages 4 and Up) [place]: Emergency Dept., [facility] Additional Resources National Hopeline Network: 1-800-SUICIDE The National Suicide Prevention Lifeline: 1-800-273-TALK     Dickie La, BSW, MSW, LCSW Managed Medicaid LCSW [facility]  [facility]  Network Museum/gallery exhibitions [email or web address] Phone: [phone]

## 2023-03-19 ENCOUNTER — Other Ambulatory Visit: Payer: Medicaid Other

## 2023-03-19 NOTE — Patient Instructions (Signed)
Visit Information  Ms. Weinhold was given information about Medicaid Managed Care team care coordination services as a part of their [employer] Community Plan Medicaid benefit. Henessy Kartes verbally consented to engagement with the Medicaid Managed Care team.   If you are experiencing a medical emergency, please call 911 or report to your local emergency department or urgent care.   If you have a non-emergency medical problem during routine business hours, please contact your provider's office and ask to speak with a nurse.   For questions related to your [employer] Community Plan Medicaid, please call: [phone] or visit the homepage here: [email or web address]  If you would like to schedule transportation through your [employer] Community Plan Medicaid, please call the following number at least 2 days in advance of your appointment: [phone]   Rides for urgent appointments can also be made after hours by calling Member Services.  Call the Behavioral Health Crisis Line at [phone], at any time, 24 hours a day, 7 days a week. If you are in danger or need immediate medical attention call 911.  If you would like help to quit smoking, call 1-800-QUIT-NOW (1-800-784-8669) OR Espaol: 1-855-Djelo-Ya (1-855-335-3569) o para ms informacin haga clic aqu or Text READY to 200-400 to register via text  Ms. Davilla - following are the goals we discussed in your visit today:   Goals Addressed   None     Social Worker will follow up in 30 days.   Randilyn Foisy, BSW, MHA [place]  Managed Medicaid Social Worker [phone]   Following is a copy of your plan of care:  There are no care plans that you recently modified to display for this patient.   

## 2023-03-19 NOTE — Patient Outreach (Signed)
Medicaid Managed Care Social Work Note  03/19/2023 Name:  Kaitlyn Crane MRN:  [id number] DOB:  08/24/1996  Kaitlyn Crane is an 27 y.o. year old female who is a primary patient of Olive Bass, FNP.  The [facility] Managed Care Coordination team was consulted for assistance with:   housing  Kaitlyn Crane was given information about Medicaid Managed Care Coordination team services today. Kaitlyn Crane Patient agreed to services and verbal consent obtained.  Engaged with patient  for by telephone forfollow up visit in response to referral for case management and/or care coordination services.   Assessments/Interventions:  Review of past medical history, allergies, medications, health status, including review of consultants reports, laboratory and other test data, was performed as part of comprehensive evaluation and provision of chronic care management services.  SDOH: (Social Determinant of Health) assessments and interventions performed: SDOH Interventions    Flowsheet Row Patient Outreach Telephone from 02/26/2023 in [place] POPULATION HEALTH DEPARTMENT Patient Outreach Telephone from 02/12/2023 in [place] POPULATION HEALTH DEPARTMENT Patient Outreach Telephone from 01/07/2023 in [place] POPULATION HEALTH DEPARTMENT Patient Outreach Telephone from 12/26/2022 in [place] POPULATION HEALTH DEPARTMENT Patient Outreach Telephone from 12/11/2022 in [place] POPULATION HEALTH DEPARTMENT Patient Outreach Telephone from 11/30/2022 in [place] POPULATION HEALTH DEPARTMENT  SDOH Interventions        Stress Interventions Offered [employer], Provide Counseling  [crying spells this past week but in good spirits as of today] [employer], Provide Counseling Offered [employer], Provide Counseling Offered [employer], Provide Counseling Offered [facility], Provide Counseling Offered [employer] [facility],  Provide Counseling     BSW completed a telephone outreach with patient. She did receieve the resources BSW sent to her. BSW and patient discussed new affordable housing being built in [place], [place] was unable to locate the information and will continue to check on updates. No other resources are needed at this time.  Advanced Directives Status:  Not addressed in this encounter.  Care Plan                 Allergies  Allergen Reactions   Shellfish Allergy Other (See Comments)    Swelling of tonsils   Carbamide Peroxide Rash   Nickel Sulfate [Nickel] Rash    Medications Reviewed Today     Reviewed by Henrene Pastor, RPH-CPP (Pharmacist) on 02/14/23 at 1251  Med List Status: <None>   Medication Order Taking? Sig Documenting Provider Last Dose Status Informant  buPROPion (WELLBUTRIN) 75 MG tablet [id number] Yes Take 75 mg by mouth in the morning. [provider] Taking Active   cromolyn (OPTICROM) 4 % ophthalmic solution [id number] No Place 1 drop into both eyes 4 (four) times daily as needed.  Patient not taking: Reported on 02/14/2023   Marcelyn Bruins, MD Not Taking Active   EPIPEN 2-PAK 0.3 MG/0.3ML SOAJ injection [id number]  Inject 0.3 mg into the muscle as needed for anaphylaxis. Clayborne Dana, NP  Active   feeding supplement (ENSURE ENLIVE / ENSURE PLUS) LIQD [id number] No Take 237 mLs by mouth 3 (three) times daily between meals.  Patient not taking: Reported on 02/14/2023   [provider] Not Taking Active   fluticasone (FLONASE) 50 MCG/ACT nasal spray [id number] No Place 2 sprays into both nostrils daily as needed for allergies or rhinitis.  Patient not taking: Reported on 02/14/2023   Marcelyn Bruins, MD Not Taking Active   lamoTRIgine (LAMICTAL) 25 MG tablet [id number] No Take  25 mg by mouth daily.  Patient not taking: Reported on 02/14/2023   [provider] Not Taking Active   levocetirizine (XYZAL) 5 MG tablet [id number] Yes Take 1  tablet (5 mg total) by mouth daily as needed. Marcelyn Bruins, MD Taking Active   levonorgestrel (MIRENA, 52 MG,) 20 MCG/DAY IUD [id number] Yes used office stock [provider] Taking Active   lurasidone (LATUDA) 40 MG TABS tablet [id number] Yes Take 1 tablet by mouth daily. [provider] Taking Active   Multiple Vitamin (MULTIVITAMIN WITH MINERALS) TABS tablet [id number] No Take 1 tablet by mouth daily.  Patient not taking: Reported on 02/14/2023   Karsten Ro, MD Not Taking Active   omeprazole (PRILOSEC) 20 MG capsule [id number] Yes Take 20 mg by mouth 2 (two) times daily before a meal. [provider] Taking Active   sodium chloride (OCEAN) 0.65 % SOLN nasal spray [id number] Yes Place 1 spray into both nostrils as needed for congestion. [provider] Taking Active   traZODone (DESYREL) 50 MG tablet [id number] Yes Take 50 mg by mouth at bedtime. [provider]  Active            Med Note Clydie Braun, Cindie Laroche Feb 14, 2023 12:50 PM) Not currently taking but per patient Dr Brooke Dare told her she could start if needed for sleep  triamcinolone ointment (KENALOG) 0.1 % [id number] Yes Apply sparingly to affected areas twice daily as needed below face and neck Marcelyn Bruins, MD Taking Active   VENTOLIN HFA 108 (90 Base) MCG/ACT inhaler [id number] Yes TAKE 2 PUFFS BY MOUTH EVERY 6 HOURS AS NEEDED FOR WHEEZE OR SHORTNESS OF BREATH Marcelyn Bruins, MD Taking Active   vitamin B-12 1000 MCG tablet [id number] No Take 1 tablet (1,000 mcg total) by mouth daily.  Patient not taking: Reported on 02/14/2023   Karsten Ro, MD Not Taking Active   Vitamin D, Ergocalciferol, (DRISDOL) 1.25 MG (50000 UNIT) CAPS capsule [id number] Yes Take 50,000 Units by mouth every 7 (seven) days. 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   Follow up:  Patient agrees to Care Plan and Follow-up.  Plan: The Managed Medicaid care management team will reach out to the patient again over the next 30 days.  Date/time of next scheduled Social Work care management/care coordination outreach:  04/22/23  Gus Puma, Kenard Gower, [facility]  Managed [facility] Social Worker [phone]

## 2023-03-20 ENCOUNTER — Encounter (HOSPITAL_COMMUNITY): Payer: Self-pay

## 2023-03-20 ENCOUNTER — Ambulatory Visit (HOSPITAL_COMMUNITY): Payer: Medicaid Other | Admitting: Clinical

## 2023-03-21 DIAGNOSIS — Z23 Encounter for immunization: Secondary | ICD-10-CM | POA: Diagnosis not present

## 2023-03-27 DIAGNOSIS — F3132 Bipolar disorder, current episode depressed, moderate: Secondary | ICD-10-CM | POA: Diagnosis not present

## 2023-03-29 DIAGNOSIS — N3021 Other chronic cystitis with hematuria: Secondary | ICD-10-CM | POA: Diagnosis not present

## 2023-04-02 ENCOUNTER — Ambulatory Visit: Payer: Medicaid Other | Admitting: Registered"

## 2023-04-04 ENCOUNTER — Ambulatory Visit (HOSPITAL_COMMUNITY): Payer: Medicaid Other | Admitting: Clinical

## 2023-04-22 ENCOUNTER — Ambulatory Visit: Payer: Medicaid Other

## 2023-04-24 ENCOUNTER — Ambulatory Visit: Payer: Medicaid Other | Admitting: Allergy

## 2023-05-02 DIAGNOSIS — F3132 Bipolar disorder, current episode depressed, moderate: Secondary | ICD-10-CM | POA: Diagnosis not present

## 2023-05-14 ENCOUNTER — Other Ambulatory Visit: Payer: Self-pay | Admitting: Allergy

## 2023-05-17 ENCOUNTER — Encounter: Payer: Self-pay | Admitting: Allergy

## 2023-05-17 ENCOUNTER — Ambulatory Visit (INDEPENDENT_AMBULATORY_CARE_PROVIDER_SITE_OTHER): Payer: Medicaid Other | Admitting: Allergy

## 2023-05-17 VITALS — BP 110/70 | HR 78 | Resp 16

## 2023-05-17 DIAGNOSIS — J3089 Other allergic rhinitis: Secondary | ICD-10-CM | POA: Diagnosis not present

## 2023-05-17 DIAGNOSIS — L2389 Allergic contact dermatitis due to other agents: Secondary | ICD-10-CM | POA: Diagnosis not present

## 2023-05-17 DIAGNOSIS — J45990 Exercise induced bronchospasm: Secondary | ICD-10-CM | POA: Diagnosis not present

## 2023-05-17 DIAGNOSIS — T781XXD Other adverse food reactions, not elsewhere classified, subsequent encounter: Secondary | ICD-10-CM

## 2023-05-17 DIAGNOSIS — H1013 Acute atopic conjunctivitis, bilateral: Secondary | ICD-10-CM

## 2023-05-17 MED ORDER — FLUTICASONE PROPIONATE 50 MCG/ACT NA SUSP: 2.0000 | Freq: Every day | NASAL | 5 refills | Status: DC | PRN

## 2023-05-17 MED ORDER — CROMOLYN SODIUM 4 % OP SOLN: 1.0000 [drp] | Freq: Four times a day (QID) | OPHTHALMIC | 5 refills | Status: DC | PRN

## 2023-05-17 MED ORDER — LEVOCETIRIZINE DIHYDROCHLORIDE 5 MG PO TABS: 5.0000 mg | ORAL_TABLET | Freq: Every day | ORAL | 5 refills | Status: DC | PRN

## 2023-05-17 MED ORDER — TRIAMCINOLONE ACETONIDE 0.1 % EX OINT: TOPICAL_OINTMENT | CUTANEOUS | 5 refills | Status: DC

## 2023-05-17 MED ORDER — VENTOLIN HFA 108 (90 BASE) MCG/ACT IN AERS: 2.0000 | INHALATION_SPRAY | Freq: Four times a day (QID) | RESPIRATORY_TRACT | 1 refills | Status: DC | PRN

## 2023-05-17 NOTE — Progress Notes (Signed)
Follow-up Note  RE: Kaitlyn Crane MRN: [id number] DOB: 09/05/1996 Date of Office Visit: 05/17/2023   History of present illness: Kaitlyn Crane is a 27 y.o. female presenting today for follow-up of rhinitis, contact dermatitis, food allergy, exercise-induced asthma.  She was last seen in the office on 10/24/2022 by myself.  She is here today with her son. She states she has been craving shellfish and states she does try to avoid shrimp.  However since she has been craving shellfish she she has had some shellfish ingestion.  She states she was "testing her limits".  She states after she at shellfish at beach she noted "bumps on my body" after that that were itchy.  This occurred in July 4th weekend.  She states she has tried to take her Zyrtec before eating shellfish and this does not seem to be that helpful.  She has had positive allergy testing to shrimp.  She has been recommended to avoid shellfish.  She does have access to an epinephrine device.  She states she has been walking in the park for exercise and she does use albuterol prior to exercising.  In doing so she has not needed to use albuterol after the fact for symptom relief.  She states she does get itchy when she walks/exercise.   With her allergies she is taking Zyrtec on an as-needed basis.  She does not report use of nasal spray or eyedrop at this time.  She is avoiding products containing nickel and Carbomix that were positive on her patch testing.  Review of systems: 10pt ROS negative unless noted above in HPI Past medical/social/surgical/family history have been reviewed and are unchanged unless specifically indicated below.  No changes  Medication List: Current Outpatient Medications  Medication Sig Dispense Refill   buPROPion (WELLBUTRIN XL) 150 MG 24 hr tablet Take 150 mg by mouth daily.     EPIPEN 2-PAK 0.3 MG/0.3ML SOAJ injection Inject 0.3 mg into the muscle as needed for anaphylaxis. 1 each 1   feeding supplement (ENSURE  ENLIVE / ENSURE PLUS) LIQD Take 237 mLs by mouth 3 (three) times daily between meals.     lamoTRIgine (LAMICTAL) 25 MG tablet Take 25 mg by mouth daily.     levonorgestrel (MIRENA, 52 MG,) 20 MCG/DAY IUD used office stock     lurasidone (LATUDA) 40 MG TABS tablet Take 1 tablet by mouth daily.     Multiple Vitamin (MULTIVITAMIN WITH MINERALS) TABS tablet Take 1 tablet by mouth daily.     omeprazole (PRILOSEC) 20 MG capsule Take 20 mg by mouth 2 (two) times daily before a meal.     sodium chloride (OCEAN) 0.65 % SOLN nasal spray Place 1 spray into both nostrils as needed for congestion.     traZODone (DESYREL) 50 MG tablet Take 50 mg by mouth at bedtime.     vitamin B-12 1000 MCG tablet Take 1 tablet (1,000 mcg total) by mouth daily.     Vitamin D, Ergocalciferol, (DRISDOL) 1.25 MG (50000 UNIT) CAPS capsule Take 50,000 Units by mouth every 7 (seven) days.     cromolyn (OPTICROM) 4 % ophthalmic solution Place 1 drop into both eyes 4 (four) times daily as needed. 10 mL 5   fluticasone (FLONASE) 50 MCG/ACT nasal spray Place 2 sprays into both nostrils daily as needed for allergies or rhinitis. 16 mL 5   levocetirizine (XYZAL) 5 MG tablet Take 1 tablet (5 mg total) by mouth daily as needed. 30 tablet 5   triamcinolone  ointment (KENALOG) 0.1 % Apply sparingly to affected areas twice daily as needed below face and neck 80 g 5   VENTOLIN HFA 108 (90 Base) MCG/ACT inhaler Inhale 2 puffs into the lungs every 6 (six) hours as needed for wheezing or shortness of breath. 18 g 1   No current facility-administered medications for this visit.     Known medication allergies: Allergies  Allergen Reactions   Shellfish Allergy Other (See Comments)    Swelling of tonsils   Carbamide Peroxide Rash   Nickel Sulfate [Nickel] Rash     Physical examination: Blood pressure 110/70, pulse 78, resp. rate 16, SpO2 99%, currently breastfeeding.  General: Alert, interactive, in no acute distress. HEENT: PERRLA, TMs  pearly gray, turbinates minimally edematous without discharge, post-pharynx non erythematous. Neck: Supple without lymphadenopathy. Lungs: Clear to auscultation without wheezing, rhonchi or rales. {no increased work of breathing. CV: Normal S1, S2 without murmurs. Abdomen: Nondistended, nontender. Skin: Warm and dry, without lesions or rashes. Extremities:  No clubbing, cyanosis or edema. Neuro:   Grossly intact.  Diagnositics/Labs: None today  Assessment and plan:   Rhinitis - Continue avoidance measures for dust mites. - For allergy symptoms and itching with exercise take Xyzal 5mg  daily - For nasal congestion/drainage can use Flonase 2 sprays each nostril daily for 1-2 weeks at a time before stopping once nasal congestion improves for maximum benefit - For itchy/watery eyes can use Cromolyn 1 drop each eye 3-4 times a day as needed   Contact dermatitis (contact allergy) - patch testing was positive to nickel and carba mix.  Do your best to avoid these products.   - if you develop rash (red, irritated, dry, itchy, patchy, scaly, flaky) only areas on the body then you can use Triamcinolone 0.1 % ointment twice a day as needed. - keep skin moisturized especially after bathing  Food allergy - continue avoidance of shellfish in diet - discussed Xolair injections can be helpful in decreasing risk of reaction if you have accidental ingestion of shellfish in diet.  Benefits, risk and protocol discussed and informational brochure provided.  Will obtain total IgE and shellfish panel today.   She is interested in possibly starting Xolair for food allergy management - have access to self-injectable epinephrine (Epipen or AuviQ) 0.3mg  at all times - follow emergency action plan in case of allergic reaction  Exercise-induced asthma -have access to albuterol inhaler 2 puffs every 4-6 hours as needed for cough/wheeze/shortness of breath/chest tightness.  May use 15-20 minutes prior to activity.    Monitor frequency of use.     Routine follow-up in 6 months or sooner if needed  I appreciate the opportunity to take part in Kaitlyn Crane's care. Please do not hesitate to contact me with questions.  Sincerely,   Margo Aye, MD Allergy/Immunology [facility]

## 2023-05-17 NOTE — Patient Instructions (Addendum)
Rhinitis - Continue avoidance measures for dust mites. - For allergy symptoms and itching with exercise take Xyzal 5mg  daily - For nasal congestion/drainage can use Flonase 2 sprays each nostril daily for 1-2 weeks at a time before stopping once nasal congestion improves for maximum benefit - For itchy/watery eyes can use Cromolyn 1 drop each eye 3-4 times a day as needed   Contact dermatitis (contact allergy) - patch testing was positive to nickel and carba mix.  Do your best to avoid these products.   - if you develop rash (red, irritated, dry, itchy, patchy, scaly, flaky) only areas on the body then you can use Triamcinolone 0.1 % ointment twice a day as needed. - keep skin moisturized especially after bathing  Food allergy - continue avoidance of shellfish in diet - discussed Xolair injections can be helpful in decreasing risk of reaction if you have accidental ingestion of shellfish in diet.  Benefits, risk and protocol discussed and informational brochure provided.  Will obtain total IgE and shellfish panel today.   - have access to self-injectable epinephrine (Epipen or AuviQ) 0.3mg  at all times - follow emergency action plan in case of allergic reaction  Exercise-induced asthma -have access to albuterol inhaler 2 puffs every 4-6 hours as needed for cough/wheeze/shortness of breath/chest tightness.  May use 15-20 minutes prior to activity.   Monitor frequency of use.     Routine follow-up in 6 months or sooner if needed

## 2023-05-23 ENCOUNTER — Ambulatory Visit (HOSPITAL_COMMUNITY): Payer: Medicaid Other | Admitting: Clinical

## 2023-05-23 DIAGNOSIS — F331 Major depressive disorder, recurrent, moderate: Secondary | ICD-10-CM

## 2023-05-24 NOTE — Progress Notes (Signed)
THERAPIST PROGRESS NOTE Virtual Visit via Video Note  I connected with Kaitlyn Crane on 05/23/2023 at  4:00 PM EDT by a video enabled telemedicine application and verified that I am speaking with the correct person using two identifiers.  Location: Patient: home Provider: office   I discussed the limitations of evaluation and management by telemedicine and the availability of in person appointments. The patient expressed understanding and agreed to proceed.   Follow Up Instructions: I discussed the assessment and treatment plan with the patient. The patient was provided an opportunity to ask questions and all were answered. The patient agreed with the plan and demonstrated an understanding of the instructions.   The patient was advised to call back or seek an in-person evaluation if the symptoms worsen or if the condition fails to improve as anticipated.    Session Time: 45 minutes  Participation Level: Active  Behavioral Response: CasualAlertAnxious  Type of Therapy: Individual Therapy  Treatment Goals addressed: client  will practice behavioral activation skills 3 times per week for the next 12 weeks   ProgressTowards Goals: Progressing  Interventions: CBT  Summary:  Kaitlyn Crane is a 27 y.o. female who presents for the scheduled appointment oriented times five, appropriately dressed and friendly. Client denied hallucinations and delusions. Client reported she has been doing pretty well. Client reported she has been struggling with staying consistent with her appointments. Client reported it can be hard to keep her motivation to attend therapy appointments. Client reported she met with her psychiatrist and was prescribed welbutrin to help with having motivation. Client reported she went for a hike and is going to the beach this weekend with her brother. Client reported she wants to do more activities as such because she enjoys it. Client reported she also has not done work with [employer] lately. Client reported after her son starts back to school she is going to be intentional engaging herself with work and personal development activities. Client reported she is hesitant to branch out and do things on her own due to negative feedback she tends to receive from family as well. Client reported she wants to improve putting herself first and being consistent with that over people pleasing others and/or stopping because of what they say. Client reported she wants to either work in a nail salon or go back to school for medical billing and coding.  Evidence of progress towards goal:  client identified 1 negative cognitive pattern and 2 personal goals she would like to work on for herself.  Suicidal/Homicidal: Nowithout intent/plan  Therapist Response:  Therapist began the appointment asking the client how she has been doing. Therapist used CBT to engage using active listening and positive emotional support. Therapist used CBT to engage and ask the client to identify the cognitive pattern related to barriers to attend therapy. Therapist used CBT to ask open ended questions about challenges she encounters within interpersonal relationships and its effect on her cognitive patterns and behaviors. Therapist used CBT to teach and discuss reframing, steps to practice for self confidence and keeping motivation. Therapist used CBT ask the client to identify her progress with frequency of use with coping skills with continued practice in her daily activity.    Therapist assigned the client homework to practice skills discussed in session.   Plan: Return again in 4 weeks.  Diagnosis: major depressive disorder, recurrent episode, moderate  Collaboration of Care: Patient refused AEB none requested by the client.  Patient/Guardian was advised Release of Information  must be obtained prior to any record release in order to collaborate their care with an outside provider. Patient/Guardian was  advised if they have not already done so to contact the registration department to sign all necessary forms in order for [place] to release information regarding their care.   Consent: Patient/Guardian gives verbal consent for treatment and assignment of benefits for services provided during this visit. Patient/Guardian expressed understanding and agreed to proceed.   Neena Rhymes Kaitlyn Fass, LCSW 05/23/2023

## 2023-05-28 ENCOUNTER — Other Ambulatory Visit (HOSPITAL_COMMUNITY): Payer: Self-pay

## 2023-05-28 ENCOUNTER — Other Ambulatory Visit: Payer: Self-pay

## 2023-05-28 ENCOUNTER — Telehealth: Payer: Self-pay | Admitting: *Deleted

## 2023-05-28 MED ORDER — OMALIZUMAB 150 MG/ML ~~LOC~~ SOSY
300.0000 mg | PREFILLED_SYRINGE | SUBCUTANEOUS | 11 refills | Status: DC
  Filled 2023-05-28 – 2023-05-29 (×2): qty 2, 28d supply, fill #0
  Filled 2023-06-21: qty 2, 28d supply, fill #1
  Filled 2023-07-24: qty 2, 28d supply, fill #2
  Filled 2023-08-14: qty 2, 28d supply, fill #3
  Filled 2023-10-02: qty 2, 28d supply, fill #4
  Filled 2023-11-11: qty 2, 28d supply, fill #5

## 2023-05-28 NOTE — Telephone Encounter (Signed)
-----   Message from [name] sent at 05/24/2023  8:47 AM EDT ----- Please let pt know the following: Shrimp and scallop IgE are positive.   Kaitlyn Crane -- pt is interested in Xolair for food allergy management

## 2023-05-28 NOTE — Telephone Encounter (Signed)
Called patient and advised approval and submit to [facility] for Xolair for food allergy. Will reach out to her once delivery set to make appt to start therapy

## 2023-05-29 ENCOUNTER — Other Ambulatory Visit (HOSPITAL_COMMUNITY): Payer: Self-pay

## 2023-05-29 ENCOUNTER — Other Ambulatory Visit: Payer: Self-pay

## 2023-05-30 ENCOUNTER — Other Ambulatory Visit: Payer: Self-pay

## 2023-06-03 DIAGNOSIS — F3132 Bipolar disorder, current episode depressed, moderate: Secondary | ICD-10-CM | POA: Diagnosis not present

## 2023-06-04 ENCOUNTER — Ambulatory Visit (INDEPENDENT_AMBULATORY_CARE_PROVIDER_SITE_OTHER): Payer: Medicaid Other | Admitting: *Deleted

## 2023-06-04 DIAGNOSIS — Z91013 Allergy to seafood: Secondary | ICD-10-CM

## 2023-06-04 MED ORDER — OMALIZUMAB 150 MG/ML ~~LOC~~ SOSY
300.0000 mg | PREFILLED_SYRINGE | SUBCUTANEOUS | Status: AC
Start: 2023-06-04 — End: ?
  Administered 2023-06-04 – 2023-10-21 (×5): 300 mg via SUBCUTANEOUS

## 2023-06-04 NOTE — Progress Notes (Signed)
Immunotherapy   Patient Details  Name: Kaitlyn Crane MRN: [id number] Date of Birth: 06/13/1996  06/04/2023  Kaitlyn Crane started injections for  Xolair  Frequency: Every 28 days  Epi-Pen: Yes  Consent signed and patient instructions given. Patient started Xolair today and received 150mg  in the RUA and 150mg  in the LUA. Patient waited 30 minutes in office and did not experience any issues.   Kaitlyn Crane 06/04/2023, 12:13 PM

## 2023-06-19 ENCOUNTER — Other Ambulatory Visit (HOSPITAL_COMMUNITY): Payer: Self-pay

## 2023-06-21 ENCOUNTER — Ambulatory Visit (HOSPITAL_COMMUNITY): Payer: Medicaid Other | Admitting: Clinical

## 2023-06-21 ENCOUNTER — Other Ambulatory Visit (HOSPITAL_COMMUNITY): Payer: Self-pay

## 2023-07-03 ENCOUNTER — Ambulatory Visit (INDEPENDENT_AMBULATORY_CARE_PROVIDER_SITE_OTHER): Payer: Medicaid Other

## 2023-07-03 DIAGNOSIS — Z91013 Allergy to seafood: Secondary | ICD-10-CM | POA: Diagnosis not present

## 2023-07-09 DIAGNOSIS — F3132 Bipolar disorder, current episode depressed, moderate: Secondary | ICD-10-CM | POA: Diagnosis not present

## 2023-07-15 DIAGNOSIS — N3021 Other chronic cystitis with hematuria: Secondary | ICD-10-CM | POA: Diagnosis not present

## 2023-07-22 ENCOUNTER — Other Ambulatory Visit (HOSPITAL_COMMUNITY): Payer: Self-pay

## 2023-07-24 ENCOUNTER — Other Ambulatory Visit: Payer: Self-pay

## 2023-07-24 NOTE — Progress Notes (Signed)
Specialty Pharmacy Refill Coordination Note  Kaitlyn Crane is a 27 y.o. female contacted today regarding refills of specialty medication(s) Omalizumab   Patient requested Courier to Provider Office   Delivery date: 07/26/23   Verified address: [facility] [address] [place], [place]   Medication will be filled on 07/25/2023. Appointment 07/31/2023

## 2023-07-31 ENCOUNTER — Ambulatory Visit: Payer: Medicaid Other | Admitting: *Deleted

## 2023-07-31 DIAGNOSIS — Z91013 Allergy to seafood: Secondary | ICD-10-CM

## 2023-08-08 DIAGNOSIS — L700 Acne vulgaris: Secondary | ICD-10-CM | POA: Diagnosis not present

## 2023-08-08 DIAGNOSIS — L810 Postinflammatory hyperpigmentation: Secondary | ICD-10-CM | POA: Diagnosis not present

## 2023-08-14 ENCOUNTER — Other Ambulatory Visit: Payer: Self-pay

## 2023-08-14 NOTE — Progress Notes (Signed)
Specialty Pharmacy Refill Coordination Note  Kaitlyn Crane is a 27 y.o. female contacted today regarding refills of specialty medication(s) Omalizumab   Patient requested Courier to Provider Office   Delivery date: 08/23/23   Verified address: [facility] [address] [place], [place]   Medication will be filled on 08/22/23.

## 2023-08-20 DIAGNOSIS — F331 Major depressive disorder, recurrent, moderate: Secondary | ICD-10-CM | POA: Diagnosis not present

## 2023-08-20 DIAGNOSIS — F3132 Bipolar disorder, current episode depressed, moderate: Secondary | ICD-10-CM | POA: Diagnosis not present

## 2023-08-28 ENCOUNTER — Encounter: Payer: Self-pay | Admitting: Physician Assistant

## 2023-08-28 ENCOUNTER — Ambulatory Visit: Payer: Medicaid Other | Admitting: Physician Assistant

## 2023-08-28 ENCOUNTER — Ambulatory Visit: Payer: Medicaid Other

## 2023-08-28 VITALS — BP 101/70 | HR 78 | Temp 97.8°F | Ht 60.0 in | Wt 123.4 lb

## 2023-08-28 DIAGNOSIS — H1011 Acute atopic conjunctivitis, right eye: Secondary | ICD-10-CM

## 2023-08-28 MED ORDER — LEVOCETIRIZINE DIHYDROCHLORIDE 5 MG PO TABS
5.0000 mg | ORAL_TABLET | Freq: Every evening | ORAL | 3 refills | Status: DC
Start: 2023-08-28 — End: 2023-11-05

## 2023-08-28 MED ORDER — OLOPATADINE HCL 0.1 % OP SOLN
1.0000 [drp] | Freq: Two times a day (BID) | OPHTHALMIC | 0 refills | Status: DC
Start: 2023-08-28 — End: 2023-11-05

## 2023-08-28 NOTE — Progress Notes (Signed)
Established patient visit   Patient: Kaitlyn Crane   DOB: 26-Feb-1996   27 y.o. Female  MRN: [id number] Visit Date: 08/28/2023  Today's healthcare provider: Alfredia Ferguson, PA-C   Chief Complaint  Patient presents with   Conjunctivitis    States she normally gets pink eye after eating seafood. This time she has not eating any but maybe a seasoning  For the last Subjective    Patient reports she often gets pinkeye after eating seafood.  For the last few days she has noticed a small amount of swelling, itching and redness to her right eye.  She thinks she may have been exposed to a fish sauce.  Denies any other allergic symptoms including a sore throat, trouble swallowing, trouble breathing, hives or rash  Medications: Outpatient Medications Prior to Visit  Medication Sig   buPROPion (WELLBUTRIN XL) 150 MG 24 hr tablet Take 150 mg by mouth daily.   cephALEXin (KEFLEX) 250 MG capsule Take 250 mg by mouth daily.   cromolyn (OPTICROM) 4 % ophthalmic solution Place 1 drop into both eyes 4 (four) times daily as needed.   EPIPEN 2-PAK 0.3 MG/0.3ML SOAJ injection Inject 0.3 mg into the muscle as needed for anaphylaxis.   feeding supplement (ENSURE ENLIVE / ENSURE PLUS) LIQD Take 237 mLs by mouth 3 (three) times daily between meals.   fluticasone (FLONASE) 50 MCG/ACT nasal spray Place 2 sprays into both nostrils daily as needed for allergies or rhinitis.   levocetirizine (XYZAL) 5 MG tablet Take 5 mg by mouth every evening.   levonorgestrel (MIRENA, 52 MG,) 20 MCG/DAY IUD used office stock   lurasidone (LATUDA) 40 MG TABS tablet Take 1 tablet by mouth daily.   Multiple Vitamin (MULTIVITAMIN WITH MINERALS) TABS tablet Take 1 tablet by mouth daily.   omalizumab [name] 150 MG/ML prefilled syringe Inject 300 mg into the skin every 28 (twenty-eight) days.   omeprazole (PRILOSEC) 20 MG capsule Take 20 mg by mouth 2 (two) times daily before a meal.   sodium chloride (OCEAN) 0.65 % SOLN nasal spray  Place 1 spray into both nostrils as needed for congestion.   spironolactone (ALDACTONE) 50 MG tablet Take 1 tablet by mouth daily.   traZODone (DESYREL) 50 MG tablet Take 50 mg by mouth at bedtime.   triamcinolone ointment (KENALOG) 0.1 % Apply sparingly to affected areas twice daily as needed below face and neck   VENTOLIN HFA 108 (90 Base) MCG/ACT inhaler Inhale 2 puffs into the lungs every 6 (six) hours as needed for wheezing or shortness of breath.   vitamin B-12 1000 MCG tablet Take 1 tablet (1,000 mcg total) by mouth daily.   [DISCONTINUED] lamoTRIgine (LAMICTAL) 25 MG tablet Take 25 mg by mouth daily.   [DISCONTINUED] levocetirizine (XYZAL) 5 MG tablet Take 1 tablet (5 mg total) by mouth daily as needed.   [DISCONTINUED] Vitamin D, Ergocalciferol, (DRISDOL) 1.25 MG (50000 UNIT) CAPS capsule Take 50,000 Units by mouth every 7 (seven) days.   Facility-Administered Medications Prior to Visit  Medication Dose Route Frequency Provider   omalizumab [name] prefilled syringe 300 mg  300 mg Subcutaneous Q28 days Marcelyn Bruins, MD    Review of Systems  Constitutional:  Negative for fatigue and fever.  Eyes:  Positive for redness and itching.  Respiratory:  Negative for cough and shortness of breath.   Cardiovascular:  Negative for chest pain and leg swelling.  Gastrointestinal:  Negative for abdominal pain.  Neurological:  Negative for dizziness and  headaches.       Objective    BP 101/70   Pulse 78   Temp 97.8 F (36.6 C) (Oral)   Ht 5' (1.524 m)   Wt 123 lb 6 oz (56 kg)   SpO2 99%   BMI 24.10 kg/m    Physical Exam Constitutional:      General: She is awake.     Appearance: She is well-developed.  HENT:     Head: Normocephalic.  Eyes:     Comments: Right eye there is a very small amount of conjunctiva injection.  No discharge appreciated.  There is a small amount of edema on the lateral aspect of her lower lid.  Cardiovascular:     Rate and Rhythm: Normal  rate and regular rhythm.     Heart sounds: Normal heart sounds.  Pulmonary:     Effort: Pulmonary effort is normal.     Breath sounds: Normal breath sounds.  Skin:    General: Skin is warm.  Neurological:     Mental Status: She is alert and oriented to person, place, and time.  Psychiatric:        Attention and Perception: Attention normal.        Mood and Affect: Mood normal.        Speech: Speech normal.        Behavior: Behavior is cooperative.     No results found for any visits on 08/28/23.  Assessment & Plan    Allergic conjunctivitis of right eye -     Levocetirizine Dihydrochloride; Take 1 tablet (5 mg total) by mouth every evening.  Dispense: 90 tablet; Refill: 3 -     Olopatadine HCl; Place 1 drop into the right eye 2 (two) times daily.  Dispense: 5 mL; Refill: 0   Prescribed allergic eyedrop, recommended she take Xyzal, provided a prescription but she also gets over-the-counter. If prescribed drop is not available, ok to take Zatidor over the counter. If symptoms progress please contact office.  Return if symptoms worsen or fail to improve.       Alfredia Ferguson, PA-C  [facility] at [facility] [phone] (phone) [phone] (fax)  [facility]

## 2023-08-30 ENCOUNTER — Other Ambulatory Visit: Payer: Self-pay | Admitting: Physician Assistant

## 2023-08-30 ENCOUNTER — Encounter: Payer: Self-pay | Admitting: Physician Assistant

## 2023-08-30 MED ORDER — POLYMYXIN B-TRIMETHOPRIM 10000-0.1 UNIT/ML-% OP SOLN: 1.0000 [drp] | OPHTHALMIC | 0 refills | Status: DC

## 2023-08-30 MED ORDER — PREDNISONE 20 MG PO TABS: 20.0000 mg | ORAL_TABLET | Freq: Every day | ORAL | 0 refills | Status: DC

## 2023-09-03 ENCOUNTER — Ambulatory Visit: Payer: Medicaid Other

## 2023-09-10 ENCOUNTER — Ambulatory Visit (INDEPENDENT_AMBULATORY_CARE_PROVIDER_SITE_OTHER): Payer: Medicaid Other

## 2023-09-10 DIAGNOSIS — Z91013 Allergy to seafood: Secondary | ICD-10-CM

## 2023-09-17 ENCOUNTER — Other Ambulatory Visit: Payer: Self-pay

## 2023-09-18 DIAGNOSIS — F3132 Bipolar disorder, current episode depressed, moderate: Secondary | ICD-10-CM | POA: Diagnosis not present

## 2023-09-18 DIAGNOSIS — F331 Major depressive disorder, recurrent, moderate: Secondary | ICD-10-CM | POA: Diagnosis not present

## 2023-09-27 ENCOUNTER — Other Ambulatory Visit: Payer: Self-pay

## 2023-10-02 ENCOUNTER — Other Ambulatory Visit (HOSPITAL_COMMUNITY): Payer: Self-pay | Admitting: Pharmacy Technician

## 2023-10-02 ENCOUNTER — Other Ambulatory Visit (HOSPITAL_COMMUNITY): Payer: Self-pay

## 2023-10-02 NOTE — Progress Notes (Signed)
Specialty Pharmacy Refill Coordination Note  Kaitlyn Crane is a 27 y.o. female contacted today regarding refills of specialty medication(s) Omalizumab [name]   Patient requested Courier to Provider Office   Delivery date: 10/04/23   Verified address: [facility] [address] [place]   Medication will be filled on 10/02/23.

## 2023-10-07 ENCOUNTER — Emergency Department: Payer: Medicaid Other

## 2023-10-07 ENCOUNTER — Other Ambulatory Visit: Payer: Self-pay

## 2023-10-07 DIAGNOSIS — S30810A Abrasion of lower back and pelvis, initial encounter: Secondary | ICD-10-CM | POA: Diagnosis not present

## 2023-10-07 DIAGNOSIS — S0990XA Unspecified injury of head, initial encounter: Secondary | ICD-10-CM | POA: Insufficient documentation

## 2023-10-07 DIAGNOSIS — S50312A Abrasion of left elbow, initial encounter: Secondary | ICD-10-CM | POA: Diagnosis not present

## 2023-10-07 DIAGNOSIS — S0083XA Contusion of other part of head, initial encounter: Secondary | ICD-10-CM | POA: Diagnosis not present

## 2023-10-07 DIAGNOSIS — S0993XA Unspecified injury of face, initial encounter: Secondary | ICD-10-CM | POA: Diagnosis present
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
## 2023-10-08 ENCOUNTER — Ambulatory Visit: Payer: Medicaid Other

## 2023-10-08 ENCOUNTER — Emergency Department
Admission: EM | Admit: 2023-10-08 | Discharge: 2023-10-08 | Disposition: A | Discharge status: Home / Self Care | Payer: Medicaid Other | Attending: Emergency Medicine | Admitting: Emergency Medicine

## 2023-10-08 DIAGNOSIS — S0083XA Contusion of other part of head, initial encounter: Secondary | ICD-10-CM

## 2023-10-08 DIAGNOSIS — S0990XA Unspecified injury of head, initial encounter: Secondary | ICD-10-CM

## 2023-10-08 MED ORDER — IBUPROFEN 600 MG PO TABS
600.0000 mg | ORAL_TABLET | Freq: Once | ORAL | Status: AC
Start: 2023-10-08 — End: 2023-10-08
  Administered 2023-10-08: 600 mg via ORAL
  Filled 2023-10-08: qty 1

## 2023-10-08 MED ORDER — ONDANSETRON 4 MG PO TBDP: 4.0000 mg | ORAL_TABLET | Freq: Three times a day (TID) | ORAL | 0 refills | Status: DC | PRN

## 2023-10-08 MED ORDER — ONDANSETRON 4 MG PO TBDP
4.0000 mg | ORAL_TABLET | Freq: Once | ORAL | Status: AC
  Administered 2023-10-08: 4 mg via ORAL
  Filled 2023-10-08: qty 1

## 2023-10-08 NOTE — ED Notes (Signed)
AVS and printed prescriptions was provided to pt. Reviewed discharge instructions with pt. Pt was able to verbalize understanding with no additional questions at this time.

## 2023-10-08 NOTE — Discharge Instructions (Addendum)
You may take Tylenol and/or Ibuprofen as needed for pain.  You may take Zofran as needed for nausea.  Return to the ER for worsening symptoms, persistent vomiting, lethargy or other concerns.
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
## 2023-10-21 ENCOUNTER — Other Ambulatory Visit (HOSPITAL_COMMUNITY): Payer: Self-pay

## 2023-10-21 ENCOUNTER — Other Ambulatory Visit: Payer: Self-pay

## 2023-10-21 ENCOUNTER — Ambulatory Visit (INDEPENDENT_AMBULATORY_CARE_PROVIDER_SITE_OTHER): Payer: Medicaid Other | Admitting: *Deleted

## 2023-10-21 DIAGNOSIS — Z91013 Allergy to seafood: Secondary | ICD-10-CM

## 2023-10-23 DIAGNOSIS — F331 Major depressive disorder, recurrent, moderate: Secondary | ICD-10-CM | POA: Diagnosis not present

## 2023-10-23 DIAGNOSIS — F3132 Bipolar disorder, current episode depressed, moderate: Secondary | ICD-10-CM | POA: Diagnosis not present

## 2023-11-05 ENCOUNTER — Encounter: Payer: Self-pay | Admitting: Family

## 2023-11-05 ENCOUNTER — Other Ambulatory Visit: Payer: Self-pay | Admitting: Family

## 2023-11-05 ENCOUNTER — Other Ambulatory Visit: Payer: Self-pay

## 2023-11-05 ENCOUNTER — Ambulatory Visit (INDEPENDENT_AMBULATORY_CARE_PROVIDER_SITE_OTHER): Payer: Medicaid Other | Admitting: Family

## 2023-11-05 VITALS — BP 102/64 | HR 72 | Ht 60.0 in | Wt 121.2 lb

## 2023-11-05 DIAGNOSIS — R635 Abnormal weight gain: Secondary | ICD-10-CM | POA: Diagnosis not present

## 2023-11-05 DIAGNOSIS — Z13220 Encounter for screening for lipoid disorders: Secondary | ICD-10-CM

## 2023-11-05 DIAGNOSIS — R7989 Other specified abnormal findings of blood chemistry: Secondary | ICD-10-CM

## 2023-11-05 DIAGNOSIS — F411 Generalized anxiety disorder: Secondary | ICD-10-CM

## 2023-11-05 DIAGNOSIS — F32A Depression, unspecified: Secondary | ICD-10-CM

## 2023-11-05 DIAGNOSIS — Z124 Encounter for screening for malignant neoplasm of cervix: Secondary | ICD-10-CM

## 2023-11-05 DIAGNOSIS — E559 Vitamin D deficiency, unspecified: Secondary | ICD-10-CM

## 2023-11-05 DIAGNOSIS — Z0000 Encounter for general adult medical examination without abnormal findings: Secondary | ICD-10-CM | POA: Diagnosis not present

## 2023-11-05 DIAGNOSIS — R636 Underweight: Secondary | ICD-10-CM

## 2023-11-05 LAB — CBC WITH DIFFERENTIAL/PLATELET
Basophils Absolute: 0 10*3/uL (ref 0.0–0.1)
Basophils Relative: 0.5 % (ref 0.0–3.0)
Eosinophils Absolute: 0 10*3/uL (ref 0.0–0.7)
Eosinophils Relative: 0.4 % (ref 0.0–5.0)
HCT: 40.9 % (ref 36.0–46.0)
Hemoglobin: 13.1 g/dL (ref 12.0–15.0)
Lymphocytes Relative: 27.2 % (ref 12.0–46.0)
Lymphs Abs: 2.1 10*3/uL (ref 0.7–4.0)
MCHC: 32.1 g/dL (ref 30.0–36.0)
MCV: 81.1 fL (ref 78.0–100.0)
Monocytes Absolute: 0.3 10*3/uL (ref 0.1–1.0)
Monocytes Relative: 4 % (ref 3.0–12.0)
Neutro Abs: 5.3 10*3/uL (ref 1.4–7.7)
Neutrophils Relative %: 67.9 % (ref 43.0–77.0)
Platelets: 307 10*3/uL (ref 150.0–400.0)
RBC: 5.04 Mil/uL (ref 3.87–5.11)
RDW: 13.3 % (ref 11.5–15.5)
WBC: 7.8 10*3/uL (ref 4.0–10.5)

## 2023-11-05 LAB — COMPREHENSIVE METABOLIC PANEL
ALT: 9 U/L (ref 0–35)
AST: 16 U/L (ref 0–37)
Albumin: 5 g/dL (ref 3.5–5.2)
Alkaline Phosphatase: 49 U/L (ref 39–117)
BUN: 10 mg/dL (ref 6–23)
CO2: 29 meq/L (ref 19–32)
Calcium: 9.5 mg/dL (ref 8.4–10.5)
Chloride: 98 meq/L (ref 96–112)
Creatinine, Ser: 0.7 mg/dL (ref 0.40–1.20)
GFR: 116.76 mL/min (ref >60.00)
Glucose, Bld: 95 mg/dL (ref 70–99)
Potassium: 4.1 meq/L (ref 3.5–5.1)
Sodium: 136 meq/L (ref 135–145)
Total Bilirubin: 0.7 mg/dL (ref 0.2–1.2)
Total Protein: 8.4 g/dL — ABNORMAL HIGH (ref 6.0–8.3)

## 2023-11-05 LAB — LIPID PANEL
Cholesterol: 227 mg/dL — ABNORMAL HIGH (ref 0–200)
HDL: 39.9 mg/dL (ref >39.00)
LDL Cholesterol: 172 mg/dL — ABNORMAL HIGH (ref 0–99)
NonHDL: 187.28
Total CHOL/HDL Ratio: 6
Triglycerides: 74 mg/dL (ref 0.0–149.0)
VLDL: 14.8 mg/dL (ref 0.0–40.0)

## 2023-11-05 NOTE — Progress Notes (Signed)
Kaitlyn Crane is a 28 y.o. 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     cephALEXin (KEFLEX) 250 MG capsule Take 250 mg by mouth daily.     EPIPEN 2-PAK 0.3 MG/0.3ML SOAJ injection Inject 0.3 mg into the muscle as needed for anaphylaxis. 1 each 1   fluticasone (FLONASE) 50 MCG/ACT nasal spray Place 2 sprays into both nostrils daily as needed for allergies or rhinitis. 16 mL 5   levocetirizine (XYZAL) 5 MG tablet Take 5 mg by mouth every evening.     levonorgestrel (MIRENA, 52 MG,) 20 MCG/DAY IUD used office stock     lurasidone (LATUDA) 40 MG TABS tablet Take 1 tablet by mouth daily.     omalizumab [name] 150 MG/ML prefilled syringe Inject 300 mg into the skin every 28 (twenty-eight) days. 2 mL 11   omeprazole (PRILOSEC) 20 MG capsule Take 20 mg by mouth 2 (two) times daily before a meal.     spironolactone (ALDACTONE) 50 MG tablet Take 1 tablet by mouth daily.     traZODone (DESYREL) 50 MG tablet Take 50 mg by mouth at bedtime.     triamcinolone ointment (KENALOG) 0.1 % Apply sparingly to affected areas twice  daily as needed below face and neck 80 g 5   VENTOLIN HFA 108 (90 Base) MCG/ACT inhaler Inhale 2 puffs into the lungs every 6 (six) hours as needed for wheezing or shortness of breath. 18 g 1   sodium chloride (OCEAN) 0.65 % SOLN nasal spray Place 1 spray into both nostrils as needed for congestion. (Patient not taking: Reported on 11/05/2023)     Current Facility-Administered Medications  Medication Dose Route Frequency Provider Last Rate Last Admin   omalizumab [name] prefilled syringe 300 mg  300 mg Subcutaneous Q28 days Marcelyn Bruins, MD   300 mg at 10/21/23 1536    Allergies: Shellfish allergy, Carbamide peroxide, and Nickel sulfate [nickel]  Past Medical History:  Diagnosis Date   Infection 10/15/2010   UTERUS ?   No pertinent past medical history    Urticaria     Past Surgical History:  Procedure Laterality Date   NO PAST SURGERIES      Family History  Problem Relation Age of Onset   Stroke Other    Hypertension Other    Asthma Other    Alcohol abuse Neg Hx     Social History   Tobacco Use   Smoking status: Never   Smokeless tobacco: Never  Substance Use Topics   Alcohol use: No    Subjective:   Presents for yearly CPE; requesting to get updated referrals  to therapist, GYN and nutritionist; continuing to work with allergist and urologist;  Is back in school at [employer] to study Health Information Management;   Review of Systems  Constitutional: Negative.   HENT: Negative.    Eyes: Negative.   Respiratory: Negative.    Cardiovascular: Negative.   Gastrointestinal: Negative.   Genitourinary: Negative.   Musculoskeletal: Negative.   Skin: Negative.   Neurological: Negative.  Weakness: .lwp.  Endo/Heme/Allergies: Negative.   Psychiatric/Behavioral: Negative.       Objective:  Vitals:   11/05/23 0953  BP: 102/64  Pulse: 72  SpO2: 99%  Weight: 121 lb 3.2 oz (55 kg)  Height: 5' (1.524 m)    General: Well developed, well nourished, in no acute  distress  Skin : Warm and dry.  Head: Normocephalic and atraumatic  Eyes: Sclera and conjunctiva clear; pupils round and reactive to light; extraocular movements intact  Ears: External normal; canals clear; tympanic membranes normal  Oropharynx: Pink, supple. No suspicious lesions  Neck: Supple without thyromegaly, adenopathy  Lungs: Respirations unlabored; clear to auscultation bilaterally without wheeze, rales, rhonchi  CVS exam: normal rate and regular rhythm.  Abdomen: Soft; nontender; nondistended; normoactive bowel sounds; no masses or hepatosplenomegaly  Musculoskeletal: No deformities; no active joint inflammation  Extremities: No edema, cyanosis, clubbing  Vessels: Symmetric bilaterally  Neurologic: Alert and oriented; speech intact; face symmetrical; moves all extremities well; CNII-XII intact without focal deficit   Assessment:  1. PE (physical exam), annual   2. Lipid screening   3. Weight gain   4. Vitamin D deficiency   5. Low vitamin B12 level     Plan:   Age appropriate preventive healthcare needs addressed; encouraged regular eye doctor and dental exams; encouraged regular exercise; will update labs and refills as needed today; follow-up in 1 year, sooner prn.    No follow-ups on file.  Orders Placed This Encounter  Procedures   CBC with Differential/Platelet   Comp Met (CMET)   Lipid panel   TSH   Vitamin D (25 hydroxy)   B12    Requested Prescriptions    No prescriptions requested or ordered in this encounter

## 2023-11-05 NOTE — Progress Notes (Unsigned)
Kaitlyn Crane is a 28 y.o. 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     cephALEXin (KEFLEX) 250 MG capsule Take 250 mg by mouth daily.     EPIPEN 2-PAK 0.3 MG/0.3ML SOAJ injection Inject 0.3 mg into the muscle as needed for anaphylaxis. 1 each 1   fluticasone (FLONASE) 50 MCG/ACT nasal spray Place 2 sprays into both nostrils daily as needed for allergies or rhinitis. 16 mL 5   levocetirizine (XYZAL) 5 MG tablet Take 5 mg by mouth every evening.     levonorgestrel (MIRENA, 52 MG,) 20 MCG/DAY IUD used office stock     lurasidone (LATUDA) 40 MG TABS tablet Take 1 tablet by mouth daily.     omalizumab [name] 150 MG/ML prefilled syringe Inject 300 mg into the skin every 28 (twenty-eight) days. 2 mL 11   omeprazole (PRILOSEC) 20 MG capsule Take 20 mg by mouth 2 (two) times daily before a meal.     sodium chloride (OCEAN) 0.65 % SOLN nasal spray Place 1 spray into both nostrils as needed for congestion. (Patient not taking: Reported on 11/05/2023)     spironolactone (ALDACTONE) 50 MG tablet Take 1 tablet by mouth daily.      traZODone (DESYREL) 50 MG tablet Take 50 mg by mouth at bedtime.     triamcinolone ointment (KENALOG) 0.1 % Apply sparingly to affected areas twice daily as needed below face and neck 80 g 5   VENTOLIN HFA 108 (90 Base) MCG/ACT inhaler Inhale 2 puffs into the lungs every 6 (six) hours as needed for wheezing or shortness of breath. 18 g 1   Current Facility-Administered Medications  Medication Dose Route Frequency Provider Last Rate Last Admin   omalizumab [name] prefilled syringe 300 mg  300 mg Subcutaneous Q28 days Marcelyn Bruins, MD   300 mg at 10/21/23 1536    Allergies: Shellfish allergy, Carbamide peroxide, and Nickel sulfate [nickel]  Past Medical History:  Diagnosis Date   Infection 10/15/2010   UTERUS ?   No pertinent past medical history    Urticaria     Past Surgical History:  Procedure Laterality Date   NO PAST SURGERIES      Family History  Problem Relation Age of Onset   Stroke Other    Hypertension Other    Asthma Other    Alcohol abuse Neg Hx     Social History   Tobacco Use   Smoking status: Never   Smokeless tobacco: Never  Substance Use Topics   Alcohol use: No    Subjective:  ***  @HCCSF @  Objective:  There were no vitals  filed for this visit.  General: Well developed, well nourished, in no acute distress  Skin : Warm and dry.  Head: Normocephalic and atraumatic  Eyes: Sclera and conjunctiva clear; pupils round and reactive to light; extraocular movements intact  Ears: External normal; canals clear; tympanic membranes normal  Oropharynx: Pink, supple. No suspicious lesions  Neck: Supple without thyromegaly, adenopathy  Lungs: Respirations unlabored; clear to auscultation bilaterally without wheeze, rales, rhonchi  CVS exam: {heart exam:315510::"normal rate, regular rhythm, normal S1, S2, no murmurs, rubs, clicks or gallops"}.  Abdomen: Soft; nontender; nondistended; normoactive bowel sounds; no masses or hepatosplenomegaly   Musculoskeletal: No deformities; no active joint inflammation  Extremities: No edema, cyanosis, clubbing  Vessels: Symmetric bilaterally  Neurologic: Alert and oriented; speech intact; face symmetrical; moves all extremities well; CNII-XII intact without focal deficit  Assessment:  1. Depression, unspecified depression type   2. Anxiety state   3. Underweight due to inadequate caloric intake   4. Cervical cancer screening     Plan:  ***   No follow-ups on file.  Orders Placed This Encounter  Procedures   Ambulatory referral to Psychology    Referral Priority:   Routine    Referral Type:   Psychiatric    Referral Reason:   Patient Preference    Requested Specialty:   Psychology    Number of Visits Requested:   1   Amb Referral to Nutrition and Diabetic Education    Referral Priority:   Routine    Referral Type:   Consultation    Referral Reason:   Specialty Services Required    Number of Visits Requested:   1   Ambulatory referral to Obstetrics / Gynecology    Referral Priority:   Routine    Referral Type:   Consultation    Referral Reason:   Specialty Services Required    Requested Specialty:   Obstetrics and Gynecology    Number of Visits Requested:   1    Requested Prescriptions    No prescriptions requested or ordered in this encounter

## 2023-11-06 ENCOUNTER — Other Ambulatory Visit: Payer: Self-pay | Admitting: Family

## 2023-11-06 ENCOUNTER — Encounter: Payer: Self-pay | Admitting: Family

## 2023-11-06 LAB — VITAMIN B12: Vitamin B-12: 446 pg/mL (ref 211–911)

## 2023-11-06 LAB — TSH: TSH: 1.36 u[IU]/mL (ref 0.35–5.50)

## 2023-11-06 LAB — VITAMIN D 25 HYDROXY (VIT D DEFICIENCY, FRACTURES): VITD: 15.51 ng/mL — ABNORMAL LOW (ref 30.00–100.00)

## 2023-11-06 MED ORDER — VITAMIN D (ERGOCALCIFEROL) 1.25 MG (50000 UNIT) PO CAPS: 50000.0000 [IU] | ORAL_CAPSULE | ORAL | 0 refills | Status: AC

## 2023-11-11 ENCOUNTER — Other Ambulatory Visit: Payer: Self-pay

## 2023-11-11 ENCOUNTER — Telehealth: Payer: Self-pay

## 2023-11-11 NOTE — Telephone Encounter (Signed)
Patient needs to come in for MD appt has one set up for 2/6 then can do approval

## 2023-11-11 NOTE — Telephone Encounter (Signed)
New insurance shows [facility] needs PA, thank you!

## 2023-11-11 NOTE — Progress Notes (Signed)
Specialty Pharmacy Refill Coordination Note  Kaitlyn Crane is a 28 y.o. female contacted today regarding refills of specialty medication(s) Omalizumab [name]   Patient requested Courier to Provider Office   Delivery date: 11/18/23   Verified address: [facility] [address] [place]   Medication will be filled on 01.31.25.  PA REQ - SENT TO J.MILLER

## 2023-11-11 NOTE — Progress Notes (Signed)
Specialty Pharmacy Ongoing Clinical Assessment Note  Kaitlyn Crane is a 28 y.o. female who is being followed by the specialty pharmacy service for RxSp Allergy   Patient's specialty medication(s) reviewed today: Omalizumab [name]   Missed doses in the last 4 weeks: 0   Patient/Caregiver did not have any additional questions or concerns.   Therapeutic benefit summary: Patient is achieving benefit   Adverse events/side effects summary: No adverse events/side effects   Patient's therapy is appropriate to: Continue    Goals Addressed             This Visit's Progress    Minimize recurrence of flares       Patient is on track. Patient will maintain adherence         Follow up:  6 months  Bobette Mo Specialty Pharmacist

## 2023-11-12 ENCOUNTER — Encounter: Payer: Medicaid Other | Attending: Family | Admitting: Registered"

## 2023-11-12 ENCOUNTER — Encounter: Payer: Self-pay | Admitting: Registered"

## 2023-11-12 DIAGNOSIS — F509 Eating disorder, unspecified: Secondary | ICD-10-CM | POA: Insufficient documentation

## 2023-11-12 DIAGNOSIS — R636 Underweight: Secondary | ICD-10-CM

## 2023-11-12 DIAGNOSIS — Z713 Dietary counseling and surveillance: Secondary | ICD-10-CM | POA: Insufficient documentation

## 2023-11-12 NOTE — Progress Notes (Signed)
Appointment start time: 4:08  Appointment end time: 4:47  Patient was seen on 11/12/2023 for nutrition counseling pertaining to disordered eating  Primary care provider: Ria Clock, FNP Therapist: Dorthula Matas (sees weekly in-person; [facility]; Fulton Reek, MD-psychiatrist on 10/2022)   ROI: 08/21/2022 Any other medical team members: none   Assessment  Pt arrives with son. States she has started with trauma therapist, had 2nd session yesterday. States she sees her weekly. States she has the medication at home but has not been taking it lately. States she has been trying to keep herself up and trying not to rely on it. States she does other things to calm her mind down like journaling and listening to music. States her siblings sometimes give her a hard time about needing to take medications. States she is still talking to psychiatrist.   States she struggles with having healthy relationship with food and eating habits. States she does not eat well on school days. Report she will skip meals until after class has ended and then will eat then. States she doesn't add sugar to her food.   States she is trying to get her life back in order with getting back in school; studying [employer] Information [employer].    Growth Metrics: Goal rate of weight gain:  0.5-1.0 lb/week  Eating history: Length of time: 1 years Previous treatments: none Goals for RD meetings: improve hair shedding, dizziness/lightheadedness, headaches, focus/concentration, cold intolerance   Weight history:  Recent weight: N/A did not weigh in today Change from previous visit: N/A from previous visit 116.2 lbs on 09/19/2022 (13 months ago) Highest weight: 126   Lowest weight: 90 Most consistent weight: 121-122 What would you like to weigh: no How has weight changed in the past year: weight loss  Medical Information:  Changes in hair, skin, nails since ED started: continued hair shedding, yellowish skin, brittle nails,  easily bruised, easily bleeds Chewing/swallowing difficulties: yes, due to acid reflux Reflux or heartburn: yes, now taking omeprazole Trouble with teeth: has 6 cavities, has trouble with grinding teeth since childhood LMP without the use of hormones: 2/15   Weight at that point:  Effect of exercise on menses: none reported   Effect of hormones on menses: N/A; just had Mirena removed on 8/9, has had it for 9 years Constipation, diarrhea: has BM once/day or once/every other day Dizziness/lightheadedness: sometimes; has improved  Headaches/body aches: no, has improved; body aches have improved as well due to physical therapist Heart racing/chest pain: no, has improved Mood: has more energy during the day Sleep: 7 hrs/night; feels more rested, has improved Focus/concentration: no, has improved  Cold intolerance: no, has improved Vision changes: sometimes, blurred vision  Mental health diagnosis: depression, psychotic disorder, bipolar   Dietary assessment: A typical day consists of 2 meals and 1 snack  Safe foods include: Ramen noodles, stir fry/saute meat & vegetables, white rice, Pha, noodles, hot soupy, burgers, fish, pork, chicken, watermelon  Avoided foods include: blue crab, condiments, fish filets, beef (limits to a few times a year), bread   24 hour recall:  B (11 am): 3 pieces of sushi + small amount of duck + 1 scoop of rice + [place] toast + water S: L (12:30 pm): skipped  S:  D (7 pm): head of fish + 1.5 scoop of rice + water  S (8-9 pm):    Beverages: water (2*16 oz; 32 oz), pink drink (8 oz); 40 oz  Physical activity: none reported  What Methods Do  You Use To Control Your Weight (Compensatory behaviors)?           Restricting   Estimated energy intake: 700-800 kcal  Estimated energy needs: 2000-2200 kcal 250-275 g CHO 150-165 g pro 44-49 g fat  Nutrition Diagnosis: NB-1.5 Disordered eating pattern As related to skipping meals.  As evidenced by pt reports  intentionally skipping meals at times when depressed.  Intervention/Goals: Pt was encouraged to have 3 meals a day and to pick up medications and take as prescribed by medical providers.  Pt agreed with goals listed. Goals: - Pick up Vitamin D prescription and begin taking as prescribed.  - Aim for 3 meals a day.   Meal plan:    3 meals    2 snacks   Monitoring and Evaluation: Patient will follow up in 4 weeks.

## 2023-11-12 NOTE — Patient Instructions (Signed)
-   Pick up Vitamin D prescription and begin taking as prescribed.   - Aim for 3 meals a day.

## 2023-11-15 ENCOUNTER — Other Ambulatory Visit: Payer: Self-pay

## 2023-11-18 ENCOUNTER — Ambulatory Visit: Payer: Medicaid Other

## 2023-11-18 ENCOUNTER — Other Ambulatory Visit: Payer: Self-pay

## 2023-11-19 DIAGNOSIS — F331 Major depressive disorder, recurrent, moderate: Secondary | ICD-10-CM | POA: Diagnosis not present

## 2023-11-21 ENCOUNTER — Ambulatory Visit: Payer: Medicaid Other | Admitting: Allergy

## 2023-11-25 ENCOUNTER — Other Ambulatory Visit (HOSPITAL_COMMUNITY): Payer: Self-pay

## 2023-11-28 DIAGNOSIS — F411 Generalized anxiety disorder: Secondary | ICD-10-CM | POA: Diagnosis not present

## 2023-11-28 DIAGNOSIS — F3181 Bipolar II disorder: Secondary | ICD-10-CM | POA: Diagnosis not present

## 2023-12-09 ENCOUNTER — Other Ambulatory Visit: Payer: Self-pay

## 2023-12-10 ENCOUNTER — Encounter: Payer: Medicaid Other | Attending: Family | Admitting: Registered"

## 2023-12-10 ENCOUNTER — Encounter: Payer: Self-pay | Admitting: Registered"

## 2023-12-10 DIAGNOSIS — R636 Underweight: Secondary | ICD-10-CM

## 2023-12-10 NOTE — Patient Instructions (Addendum)
-   Aim for 3 meals a day.   - Aim to have breakfast in the morning such as fruit and granola bar.   - Continue to stay hydrated with at least 3 bottles of water a day (48+ oz).

## 2023-12-10 NOTE — Progress Notes (Unsigned)
 Appointment start time: 9:14  Appointment end time: 9:40  Patient was seen on 12/10/2023 for nutrition counseling pertaining to disordered eating  Primary care provider: Ria Clock, FNP Therapist: Dorthula Matas (sees weekly in-person; [facility]; Fulton Reek, MD-psychiatrist on 10/2022)   ROI: 08/21/2022 Any other medical team members: none   Assessment  Pt has started taking medications again as prescribed: latuda, allergy medication, and acne medication. States she is also taking Vtamin D prescription weekly as prescribed. States sessions with trauma therapist are going well; sees her weekly. States she will see her again today.   States she hasn't been able to eat 3 meals a day often. States she usually eats 2 meals/day + banana while at school.  Plans to start going to the gym 3 days/week at 8:30 am.    Growth Metrics: Goal rate of weight gain:  0.5-1.0 lb/week  Eating history: Length of time: 1 years Previous treatments: none Goals for RD meetings: improve hair shedding, dizziness/lightheadedness, headaches, focus/concentration, cold intolerance   Weight history:  Recent weight: N/A did not weigh in today Change from previous visit: N/A from previous visit 116.2 lbs on 09/19/2022 (13 months ago) Highest weight: 126   Lowest weight: 90 Most consistent weight: 121-122 What would you like to weigh: no How has weight changed in the past year: weight loss  Medical Information:  Changes in hair, skin, nails since ED started: continued hair shedding, yellowish skin, brittle nails, easily bruised, easily bleeds Chewing/swallowing difficulties: yes, due to acid reflux Reflux or heartburn: yes, now taking omeprazole Trouble with teeth: has 6 cavities, has trouble with grinding teeth since childhood LMP without the use of hormones: 2/15   Weight at that point:  Effect of exercise on menses: none reported   Effect of hormones on menses: N/A; just had Mirena removed on 8/9,  has had it for 9 years Constipation, diarrhea: has BM once/day or once/every other day Dizziness/lightheadedness: sometimes; has improved  Headaches/body aches: no, has improved; body aches have improved as well due to physical therapist Heart racing/chest pain: no, has improved Mood: has more energy during the day Sleep: 7 hrs/night; feels more rested, has improved Focus/concentration: no, has improved  Cold intolerance: no, has improved Vision changes: sometimes, blurred vision  Mental health diagnosis: depression, psychotic disorder, bipolar   Dietary assessment: A typical day consists of 2 meals and 1 snack  Safe foods include: Ramen noodles, stir fry/saute meat & vegetables, white rice, Pha, noodles, hot soupy, burgers, fish, pork, chicken, watermelon, fruit  Avoided foods include: blue crab, condiments, fish filets, beef (limits to a few times a year), bread   24 hour recall:  B (11 am): 3 pieces of sushi + small amount of duck + 1 scoop of rice + [place] toast + water S: [employer]-pink drink L (12:30 pm): ham and [place] sandwich   S:  D (6-7 pm): [employer]-breast + drumstick + rice + water or head of fish + 1.5 scoop of rice + water  S (8-9 pm):    Beverages: water (2*16 oz; 32 oz), pink drink (8 oz); 40 oz  Physical activity: none reported  What Methods Do You Use To Control Your Weight (Compensatory behaviors)?           Restricting   Estimated energy intake: 1300-1400 kcal  Estimated energy needs: 2000-2200 kcal 250-275 g CHO 150-165 g pro 44-49 g fat  Nutrition Diagnosis: NB-1.5 Disordered eating pattern As related to skipping meals.  As evidenced by pt reports  intentionally skipping meals at times when depressed.  Intervention/Goals: Pt was encouraged to have 3 meals a day and discussed ways to have breakfast in the mornings. Pt agreed with goals listed. Goals: - Aim for 3 meals a day.  - Aim to have breakfast in the morning such as fruit and granola bar.  -  Continue to stay hydrated with at least 3 bottles of water a day (48+ oz).   Meal plan:    3 meals    2 snacks   Monitoring and Evaluation: Patient will follow up in 4 weeks.

## 2023-12-16 ENCOUNTER — Ambulatory Visit: Payer: Medicaid Other

## 2023-12-26 DIAGNOSIS — F331 Major depressive disorder, recurrent, moderate: Secondary | ICD-10-CM | POA: Diagnosis not present

## 2023-12-31 ENCOUNTER — Encounter: Payer: Medicaid Other | Admitting: Obstetrics

## 2024-01-07 ENCOUNTER — Ambulatory Visit: Payer: Medicaid Other | Admitting: Registered"

## 2024-01-21 ENCOUNTER — Other Ambulatory Visit: Payer: Self-pay

## 2024-01-23 ENCOUNTER — Other Ambulatory Visit: Payer: Self-pay

## 2024-01-23 ENCOUNTER — Ambulatory Visit: Payer: Medicaid Other | Admitting: Allergy

## 2024-01-23 NOTE — Progress Notes (Signed)
 Disenrolling - pharmacy cannot fill medication until patient goes to office for reapproval visit. Patient cancelled 4.10.25 appointment and medication last filled 112 days ago (12.19.24).

## 2024-01-24 DIAGNOSIS — F331 Major depressive disorder, recurrent, moderate: Secondary | ICD-10-CM | POA: Diagnosis not present

## 2024-01-29 ENCOUNTER — Ambulatory Visit: Admitting: Physician Assistant

## 2024-01-29 ENCOUNTER — Encounter: Payer: Self-pay | Admitting: Physician Assistant

## 2024-01-29 ENCOUNTER — Ambulatory Visit: Payer: Self-pay

## 2024-01-29 VITALS — BP 94/65 | HR 78 | Temp 98.2°F | Wt 129.8 lb

## 2024-01-29 DIAGNOSIS — R051 Acute cough: Secondary | ICD-10-CM | POA: Diagnosis not present

## 2024-01-29 DIAGNOSIS — J0100 Acute maxillary sinusitis, unspecified: Secondary | ICD-10-CM | POA: Diagnosis not present

## 2024-01-29 MED ORDER — AMOXICILLIN 875 MG PO TABS
875.0000 mg | ORAL_TABLET | Freq: Two times a day (BID) | ORAL | 0 refills | Status: AC
Start: 2024-01-29 — End: 2024-02-05

## 2024-01-29 MED ORDER — BENZONATATE 100 MG PO CAPS: 100.0000 mg | ORAL_CAPSULE | Freq: Two times a day (BID) | ORAL | 0 refills | Status: DC | PRN

## 2024-01-29 NOTE — Progress Notes (Unsigned)
 Established patient visit   Patient: Kaitlyn Crane   DOB: July 25, 1996   27 y.o. Female  MRN: [id number] Visit Date: 01/29/2024  Today's healthcare provider: Alfredia Ferguson, PA-C   No chief complaint on file.  Subjective       Medications: Outpatient Medications Prior to Visit  Medication Sig   buPROPion (WELLBUTRIN XL) 150 MG 24 hr tablet Take 150 mg by mouth daily.   cephALEXin (KEFLEX) 250 MG capsule Take 250 mg by mouth daily.   EPIPEN 2-PAK 0.3 MG/0.3ML SOAJ injection Inject 0.3 mg into the muscle as needed for anaphylaxis.   fluticasone (FLONASE) 50 MCG/ACT nasal spray Place 2 sprays into both nostrils daily as needed for allergies or rhinitis.   levocetirizine (XYZAL) 5 MG tablet Take 5 mg by mouth every evening.   levonorgestrel (MIRENA, 52 MG,) 20 MCG/DAY IUD used office stock   lurasidone (LATUDA) 40 MG TABS tablet Take 1 tablet by mouth daily.   omalizumab [name] 150 MG/ML prefilled syringe Inject 300 mg into the skin every 28 (twenty-eight) days.   omeprazole (PRILOSEC) 20 MG capsule Take 20 mg by mouth 2 (two) times daily before a meal.   sodium chloride (OCEAN) 0.65 % SOLN nasal spray Place 1 spray into both nostrils as needed for congestion. (Patient not taking: Reported on 11/05/2023)   spironolactone (ALDACTONE) 50 MG tablet Take 1 tablet by mouth daily.   traZODone (DESYREL) 50 MG tablet Take 50 mg by mouth at bedtime.   triamcinolone ointment (KENALOG) 0.1 % Apply sparingly to affected areas twice daily as needed below face and neck   VENTOLIN HFA 108 (90 Base) MCG/ACT inhaler Inhale 2 puffs into the lungs every 6 (six) hours as needed for wheezing or shortness of breath.   Facility-Administered Medications Prior to Visit  Medication Dose Route Frequency Provider   omalizumab [name] prefilled syringe 300 mg  300 mg Subcutaneous Q28 days Marcelyn Bruins, MD    Review of Systems {Insert previous labs (optional):23779} {See past labs  Heme  Chem   Endocrine  Serology  Results Review (optional):1}   Objective    BP 94/65   Pulse 78   Temp 98.2 F (36.8 C)   Wt 129 lb 12.8 oz (58.9 kg)   SpO2 99%   BMI 25.35 kg/m  {Insert last BP/Wt (optional):23777}{See vitals history (optional):1}  Physical Exam Constitutional:      General: She is awake.     Appearance: She is well-developed.  HENT:     Head: Normocephalic.     Right Ear: Tympanic membrane normal.     Left Ear: Tympanic membrane normal.     Mouth/Throat:     Pharynx: Posterior oropharyngeal erythema present. No oropharyngeal exudate.  Eyes:     Conjunctiva/sclera: Conjunctivae normal.  Cardiovascular:     Rate and Rhythm: Normal rate and regular rhythm.     Heart sounds: Normal heart sounds.  Pulmonary:     Effort: Pulmonary effort is normal.     Breath sounds: Normal breath sounds.  Skin:    General: Skin is warm.  Neurological:     Mental Status: She is alert and oriented to person, place, and time.  Psychiatric:        Attention and Perception: Attention normal.        Mood and Affect: Mood normal.        Speech: Speech normal.        Behavior: Behavior is cooperative.     ***  No results found for any visits on 01/29/24.  Assessment & Plan    There are no diagnoses linked to this encounter.  ***  No follow-ups on file.       Trenton Frock, PA-C  [facility] [phone] (phone) [phone] (fax)  [facility]

## 2024-01-29 NOTE — Telephone Encounter (Signed)
  Chief Complaint: allergy symptoms Symptoms: runny nose, hoarse voice, nasal congestion, sore throat, cough Frequency: sore throat started 9 days ago, runny nose x 6 days Pertinent Negatives: Patient denies red/watery/itchy eyes, wheezing, SOB, chest pain Disposition: [] ED /[] Urgent Care (no appt availability in office) / [x] Appointment(In office/virtual)/ []  [facility]/ [] Home Care/ [] Refused Recommended Disposition /[]  Mobile Bus/ []  Follow-up with PCP Additional Notes: Patient states she has been having clear mucus from runny nose/nasal congestion. She states she has a history of asthma but denies history of allergy symptoms. She denies exposure to flu or COVID but states her significant other and her son both has similar symptoms that she thought was allergies.  Patient states she has taken Tylenol and OTC liquid cough /flu medicine. Patient unable to make virtual visit due to she states she is locked out of mychart. Patient given phone # for mychart help desk. Patient scheduled for acute visit this afternoon with available provider (no appts with PCP available).  Summary: Sore throat and congested   Copied From CRM [id number]. Reason for Triage: Patient called stating she is very congested and has a really bad sore throat. She stated she is losing her voice. No signs of chest pain or anything related to that.         Reason for Disposition  Lots of coughing  Answer Assessment - Initial Assessment Questions 1. SYMPTOM: "What's the main symptom you're concerned about?" (e.g., runny nose, stuffiness, sneezing, itching)     Cough, hoarse voice, headache.  2. SEVERITY: "How bad is it?" "What does it keep you from doing?" (e.g., sleeping, working)      She states at night her cough is really bad. She states she has not been able to go to class because of it.  3. EYES: "Are the eyes also red, watery, and itchy?"      Denies.  4. TRIGGER: "What pollen or other allergic  substance do you think is causing the symptoms?"      Patient states the pollen was a trigger for her son and she states she feels like then she caught onto it.  5. TREATMENT: "What medicine are you using?" "What medicine worked best in the past?"     Tylenol and OTC cough/flu medicine.  6. OTHER SYMPTOMS: "Do you have any other symptoms?" (e.g., coughing, difficulty breathing, wheezing)     Fever a few nights ago she states she did not have a thermometer to check it, runny nose and nasal congestion improving, sore throat improving.  7. PREGNANCY: "Is there any chance you are pregnant?" "When was your last menstrual period?"     LMP:she states she does not have it anymore due to she is on birth control.  Protocols used: Nasal Allergies (Hay Fever)-A-AH

## 2024-01-30 ENCOUNTER — Encounter: Payer: Self-pay | Admitting: Physician Assistant

## 2024-02-06 ENCOUNTER — Encounter: Payer: Self-pay | Admitting: Dietician

## 2024-02-06 ENCOUNTER — Encounter: Attending: Family | Admitting: Dietician

## 2024-02-06 DIAGNOSIS — R636 Underweight: Secondary | ICD-10-CM | POA: Diagnosis not present
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
## 2024-02-06 NOTE — Patient Instructions (Signed)
 Goals Established by Pt  Goal: have breakfast at least 2 days per week (example: greek yogurt + fruit + granola OR granola bar and banana).  Goal: incorporate a vegetarian meal 3 times a week (with tofu + vegetables + rice/noodles).

## 2024-02-10 DIAGNOSIS — L700 Acne vulgaris: Secondary | ICD-10-CM | POA: Diagnosis not present

## 2024-02-10 DIAGNOSIS — Z5181 Encounter for therapeutic drug level monitoring: Secondary | ICD-10-CM | POA: Diagnosis not present

## 2024-02-25 DIAGNOSIS — F331 Major depressive disorder, recurrent, moderate: Secondary | ICD-10-CM | POA: Diagnosis not present

## 2024-03-05 ENCOUNTER — Ambulatory Visit: Admitting: Physician Assistant

## 2024-03-05 ENCOUNTER — Encounter: Payer: Self-pay | Admitting: Physician Assistant

## 2024-03-05 ENCOUNTER — Ambulatory Visit: Payer: Self-pay

## 2024-03-05 VITALS — BP 96/65 | HR 76 | Ht 60.0 in | Wt 129.2 lb

## 2024-03-05 DIAGNOSIS — R519 Headache, unspecified: Secondary | ICD-10-CM | POA: Diagnosis not present

## 2024-03-05 DIAGNOSIS — M542 Cervicalgia: Secondary | ICD-10-CM | POA: Diagnosis not present

## 2024-03-05 DIAGNOSIS — S00512A Abrasion of oral cavity, initial encounter: Secondary | ICD-10-CM | POA: Diagnosis not present

## 2024-03-05 MED ORDER — CYCLOBENZAPRINE HCL 5 MG PO TABS: 5.0000 mg | ORAL_TABLET | Freq: Every evening | ORAL | 0 refills | Status: DC | PRN

## 2024-03-05 MED ORDER — KETOROLAC TROMETHAMINE 30 MG/ML IJ SOLN
30.0000 mg | Freq: Once | INTRAMUSCULAR | Status: AC
Start: 2024-03-05 — End: 2024-03-10

## 2024-03-05 MED ORDER — MELOXICAM 15 MG PO TABS: 15.0000 mg | ORAL_TABLET | Freq: Every day | ORAL | 0 refills | Status: AC | PRN

## 2024-03-05 MED ORDER — KETOROLAC TROMETHAMINE 30 MG/ML IJ SOLN
30.0000 mg | Freq: Once | INTRAMUSCULAR | Status: AC
  Administered 2024-03-05: 30 mg via INTRAMUSCULAR

## 2024-03-05 NOTE — Progress Notes (Signed)
 Established patient visit   Patient: Kaitlyn Crane   DOB: [birth year]   28 y.o. Female  MRN: [id number] Visit Date: 03/05/2024  Today's healthcare provider: Trenton Frock, PA-C   Cc. Neck / shoulder pain, headache  Subjective     Pt reports left sided neck/shoulder pain that started x 3 months ago, for the last few days, left side of her face/gums feel irritated .  Today, a left sided headache started. She only took tylenol  at 7:30 this AM.   Pt reports the neck pain started 3 months ago, after she slept on a hard mattress. It has persisted. Denies radiation of pain, chest pain, numbness, tingling down L arm. Denies vision changes, dizziness.  She reports accidentally eating shellfish a few days ago and thinks her gums are swollen from this. Reports an allergy . Denies any other allergic symptoms.   Medications: Outpatient Medications Prior to Visit  Medication Sig   buPROPion (WELLBUTRIN XL) 150 MG 24 hr tablet Take 150 mg by mouth daily.   EPIPEN  2-PAK 0.3 MG/0.3ML SOAJ injection Inject 0.3 mg into the muscle as needed for anaphylaxis.   levocetirizine (XYZAL ) 5 MG tablet Take 5 mg by mouth every evening.   levonorgestrel  (MIRENA , 52 MG,) 20 MCG/DAY IUD used office stock   lurasidone (LATUDA) 40 MG TABS tablet Take 1 tablet by mouth daily.   omalizumab  (XOLAIR ) 150 MG/ML prefilled syringe Inject 300 mg into the skin every 28 (twenty-eight) days.   omeprazole (PRILOSEC) 20 MG capsule Take 20 mg by mouth 2 (two) times daily before a meal.   traZODone  (DESYREL ) 50 MG tablet Take 50 mg by mouth at bedtime.   triamcinolone  ointment (KENALOG ) 0.1 % Apply sparingly to affected areas twice daily as needed below face and neck   VENTOLIN  HFA 108 (90 Base) MCG/ACT inhaler Inhale 2 puffs into the lungs every 6 (six) hours as needed for wheezing or shortness of breath.   [DISCONTINUED] benzonatate  (TESSALON ) 100 MG capsule Take 1 capsule (100 mg total) by mouth 2 (two) times daily as needed.    [DISCONTINUED] fluticasone  (FLONASE ) 50 MCG/ACT nasal spray Place 2 sprays into both nostrils daily as needed for allergies or rhinitis.   [DISCONTINUED] sodium chloride  (OCEAN) 0.65 % SOLN nasal spray Place 1 spray into both nostrils as needed for congestion. (Patient not taking: Reported on 11/05/2023)   [DISCONTINUED] spironolactone (ALDACTONE) 50 MG tablet Take 1 tablet by mouth daily.   Facility-Administered Medications Prior to Visit  Medication Dose Route Frequency Provider   omalizumab  (XOLAIR ) prefilled syringe 300 mg  300 mg Subcutaneous Q28 days Brian Campanile, MD    Review of Systems  Constitutional:  Negative for fatigue and fever.  Respiratory:  Negative for cough and shortness of breath.   Cardiovascular:  Negative for chest pain and leg swelling.  Gastrointestinal:  Negative for abdominal pain.  Musculoskeletal:  Positive for neck pain.  Neurological:  Positive for headaches. Negative for dizziness.       Objective    BP 96/65   Pulse 76   Ht 5' (1.524 m)   Wt 129 lb 3.2 oz (58.6 kg)   BMI 25.23 kg/m    Physical Exam Vitals reviewed.  Constitutional:      Appearance: She is not ill-appearing.  HENT:     Head: Normocephalic.     Mouth/Throat:     Comments: No visible edema, abscess, erythema to gum. Might be a small abrasion to the upper left with no bleeding  Eyes:     Extraocular Movements: Extraocular movements intact.     Conjunctiva/sclera: Conjunctivae normal.  Cardiovascular:     Rate and Rhythm: Normal rate.  Pulmonary:     Effort: Pulmonary effort is normal. No respiratory distress.  Musculoskeletal:     Comments: Tension and tenderness to L trap  Neurological:     Mental Status: She is alert and oriented to person, place, and time.  Psychiatric:        Mood and Affect: Mood normal.        Behavior: Behavior normal.      No results found for any visits on 03/05/24.  Assessment & Plan    Neck pain -     Cyclobenzaprine HCl;  Take 1 tablet (5 mg total) by mouth at bedtime as needed (muscle pain).  Dispense: 10 tablet; Refill: 0 -     Meloxicam; Take 1 tablet (15 mg total) by mouth daily as needed (pain, headache). Wait 24 hours from the toradol shot today (1:45 pm) to start  Dispense: 30 tablet; Refill: 0 -     Ketorolac Tromethamine  Acute nonintractable headache, unspecified headache type -     Cyclobenzaprine HCl; Take 1 tablet (5 mg total) by mouth at bedtime as needed (muscle pain).  Dispense: 10 tablet; Refill: 0 -     Meloxicam; Take 1 tablet (15 mg total) by mouth daily as needed (pain, headache). Wait 24 hours from the toradol shot today (1:45 pm) to start  Dispense: 30 tablet; Refill: 0 -     Ketorolac Tromethamine  Abrasion of gingiva, initial encounter   Admin 30 mg IM toradol today. Pt was monitored for 7 minutes before cleared to return home. Tolerated well, no adverse reaction  Rx meloxicam for tomorrow, advised no other nsaids.  Rx flexeril at bedtime . If symptoms persist or worsen f/b with office.  Possible gum abrasion? No edema consistent w/ allergic reaction. Recommending gentle brushing  Return if symptoms worsen or fail to improve.       Trenton Frock, PA-C  [facility] at [facility] [phone] (phone) [phone] (fax)  [facility]

## 2024-03-05 NOTE — Telephone Encounter (Signed)
 Pt has a scheduled office visit 03/05/2024.

## 2024-03-05 NOTE — Telephone Encounter (Signed)
 Copied from CRM [id number]. Topic: Clinical - Red Word Triage >> Mar 05, 2024  8:04 AM Antwanette L wrote: Red Word that prompted transfer to Nurse Triage: sharp pain in both shoulders. Patient is also having sharp pain on the left side of her head   Chief Complaint: Neck, left shoulder pain, headache. Slept wrong. Symptoms: above 3 months ago Frequency:  Pertinent Negatives: Patient denies  Disposition: [] ED /[] Urgent Care (no appt availability in office) / [x] Appointment(In office/virtual)/ []  [facility]/ [] Home Care/ [] Refused Recommended Disposition /[] [facility]/ []  Follow-up with PCP Additional Notes: agrees with appointment.  Reason for Disposition  [1] SEVERE neck pain (e.g., excruciating, unable to do any normal activities) AND [2] not improved after 2 hours of pain medicine  Answer Assessment - Initial Assessment Questions 1. ONSET: "When did the pain begin?"      3 months 2. LOCATION: "Where does it hurt?"      Neck, left shoulder 3. PATTERN "Does the pain come and go, or has it been constant since it started?"      Comes and goes but betting worse 4. SEVERITY: "How bad is the pain?"  (Scale 1-10; or mild, moderate, severe)   - NO PAIN (0): no pain or only slight stiffness    - MILD (1-3): doesn't interfere with normal activities    - MODERATE (4-7): interferes with normal activities or awakens from sleep    - SEVERE (8-10):  excruciating pain, unable to do any normal activities      Now - 6 5. RADIATION: "Does the pain go anywhere else, shoot into your arms?"     Shoulder 6. CORD SYMPTOMS: "Any weakness or numbness of the arms or legs?"     no 7. CAUSE: "What do you think is causing the neck pain?"     unsure 8. NECK OVERUSE: "Any recent activities that involved turning or twisting the neck?"     Slept wrong 9. OTHER SYMPTOMS: "Do you have any other symptoms?" (e.g., headache, fever, chest pain, difficulty breathing, neck swelling)      headache 10. PREGNANCY: "Is there any chance you are pregnant?" "When was your last menstrual period?"       no  Protocols used: Neck Pain or Stiffness-A-AH

## 2024-03-11 DIAGNOSIS — F4310 Post-traumatic stress disorder, unspecified: Secondary | ICD-10-CM | POA: Diagnosis not present

## 2024-03-12 ENCOUNTER — Encounter: Attending: Family | Admitting: Dietician

## 2024-03-12 ENCOUNTER — Encounter: Payer: Self-pay | Admitting: Dietician

## 2024-03-12 DIAGNOSIS — R636 Underweight: Secondary | ICD-10-CM | POA: Insufficient documentation
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
## 2024-03-13 DIAGNOSIS — Z5181 Encounter for therapeutic drug level monitoring: Secondary | ICD-10-CM | POA: Diagnosis not present

## 2024-03-26 ENCOUNTER — Ambulatory Visit: Admitting: Physician Assistant

## 2024-03-26 ENCOUNTER — Encounter: Payer: Self-pay | Admitting: Physician Assistant

## 2024-03-26 VITALS — BP 93/64 | HR 91 | Temp 98.1°F | Resp 16 | Ht 60.0 in | Wt 130.5 lb

## 2024-03-26 DIAGNOSIS — M542 Cervicalgia: Secondary | ICD-10-CM | POA: Diagnosis not present

## 2024-03-26 DIAGNOSIS — Z8639 Personal history of other endocrine, nutritional and metabolic disease: Secondary | ICD-10-CM

## 2024-03-26 DIAGNOSIS — E559 Vitamin D deficiency, unspecified: Secondary | ICD-10-CM

## 2024-03-26 DIAGNOSIS — F331 Major depressive disorder, recurrent, moderate: Secondary | ICD-10-CM | POA: Diagnosis not present

## 2024-03-26 DIAGNOSIS — F4310 Post-traumatic stress disorder, unspecified: Secondary | ICD-10-CM | POA: Diagnosis not present

## 2024-03-26 MED ORDER — CYCLOBENZAPRINE HCL 5 MG PO TABS: 5.0000 mg | ORAL_TABLET | Freq: Every evening | ORAL | 0 refills | Status: AC | PRN

## 2024-03-26 NOTE — Progress Notes (Signed)
 Established patient visit   Patient: Kaitlyn Crane   DOB: 1995-10-21   28 y.o. Female  MRN: [id number] Visit Date: 03/26/2024  Today's healthcare provider: Trenton Frock, PA-C   Chief Complaint  Patient presents with   Shoulder Pain    Seen shoulder/neck pain on 5/22- pain got better but since stopping flexeril  pain has come back   Subjective     Pt was seen 9/22 for neck pain, improved with meloxicam , flexeril .   Discussed the use of AI scribe software for clinical note transcription with the patient, who gave verbal consent to proceed.  History of Present Illness   Kaitlyn Crane is a 28 year old female who presents with persistent neck and shoulder pain.  She experiences neck and shoulder pain that initially improved with an anti-inflammatory and muscle relaxant but has returned to its previous intensity. The pain is exacerbated by turning her head. Topical treatments like icy hot have not provided much relief.  She has low vitamin D  levels, reports she has not been taking a supplement and has felt more fatigued. She has had a history of iron  deficiency as well.        Medications: Outpatient Medications Prior to Visit  Medication Sig   buPROPion (WELLBUTRIN XL) 150 MG 24 hr tablet Take 150 mg by mouth daily.   EPIPEN  2-PAK 0.3 MG/0.3ML SOAJ injection Inject 0.3 mg into the muscle as needed for anaphylaxis.   levocetirizine (XYZAL ) 5 MG tablet Take 5 mg by mouth every evening.   levonorgestrel  (MIRENA , 52 MG,) 20 MCG/DAY IUD used office stock   lurasidone (LATUDA) 40 MG TABS tablet Take 1 tablet by mouth daily.   meloxicam  (MOBIC ) 15 MG tablet Take 1 tablet (15 mg total) by mouth daily as needed (pain, headache). Wait 24 hours from the toradol  shot today (1:45 pm) to start   omalizumab  (XOLAIR ) 150 MG/ML prefilled syringe Inject 300 mg into the skin every 28 (twenty-eight) days.   omeprazole (PRILOSEC) 20 MG capsule Take 20 mg by mouth 2 (two) times daily before a meal.    traZODone  (DESYREL ) 50 MG tablet Take 50 mg by mouth at bedtime.   triamcinolone  ointment (KENALOG ) 0.1 % Apply sparingly to affected areas twice daily as needed below face and neck   VENTOLIN  HFA 108 (90 Base) MCG/ACT inhaler Inhale 2 puffs into the lungs every 6 (six) hours as needed for wheezing or shortness of breath.   ISOtretinoin (ACCUTANE) 40 MG capsule Take 40 mg by mouth 2 (two) times daily. (Patient not taking: Reported on 03/26/2024)   [DISCONTINUED] cyclobenzaprine  (FLEXERIL ) 5 MG tablet Take 1 tablet (5 mg total) by mouth at bedtime as needed (muscle pain). (Patient not taking: Reported on 03/26/2024)   Facility-Administered Medications Prior to Visit  Medication Dose Route Frequency Provider   omalizumab  (XOLAIR ) prefilled syringe 300 mg  300 mg Subcutaneous Q28 days Brian Campanile, MD    Review of Systems  Constitutional:  Positive for fatigue. Negative for fever.  Respiratory:  Negative for cough and shortness of breath.   Cardiovascular:  Negative for chest pain and leg swelling.  Gastrointestinal:  Negative for abdominal pain.  Neurological:  Negative for dizziness and headaches.       Objective    BP 93/64   Pulse 91   Temp 98.1 F (36.7 C) (Oral)   Resp 16   Ht 5' (1.524 m)   Wt 130 lb 8 oz (59.2 kg)   SpO2 99%  BMI 25.49 kg/m    Physical Exam Vitals reviewed.  Constitutional:      Appearance: She is not ill-appearing.  HENT:     Head: Normocephalic.   Eyes:     Conjunctiva/sclera: Conjunctivae normal.    Cardiovascular:     Rate and Rhythm: Normal rate.  Pulmonary:     Effort: Pulmonary effort is normal. No respiratory distress.   Neurological:     Mental Status: She is alert and oriented to person, place, and time.   Psychiatric:        Mood and Affect: Mood normal.        Behavior: Behavior normal.     No results found for any visits on 03/26/24.  Assessment & Plan    Neck pain Recommending PT  Refilled flexeril  qhs -      Ambulatory referral to Physical Therapy -     Cyclobenzaprine  HCl; Take 1 tablet (5 mg total) by mouth at bedtime as needed (muscle pain).  Dispense: 30 tablet; Refill: 0  Vitamin D  deficiency Recommend daily vit D supplement regardless of deficiency. Pending labs would recommend 5,000 IU daily or will rx a higher strength weekly -     VITAMIN D  25 Hydroxy (Vit-D Deficiency, Fractures)  History of iron  deficiency -     Iron , TIBC and Ferritin Panel    Trenton Frock, PA-C  [facility] at [facility] [phone] (phone) [phone] (fax)  [facility]

## 2024-03-27 ENCOUNTER — Ambulatory Visit: Payer: Self-pay | Admitting: Physician Assistant

## 2024-03-27 LAB — IRON,TIBC AND FERRITIN PANEL
%SAT: 28 % (ref 16–45)
Ferritin: 141 ng/mL (ref 16–154)
Iron: 76 ug/dL (ref 40–190)
TIBC: 270 ug/dL (ref 250–450)

## 2024-03-27 LAB — VITAMIN D 25 HYDROXY (VIT D DEFICIENCY, FRACTURES): Vit D, 25-Hydroxy: 30 ng/mL (ref 30–100)

## 2024-03-31 DIAGNOSIS — F4310 Post-traumatic stress disorder, unspecified: Secondary | ICD-10-CM | POA: Diagnosis not present

## 2024-04-01 DIAGNOSIS — L700 Acne vulgaris: Secondary | ICD-10-CM | POA: Diagnosis not present

## 2024-04-02 ENCOUNTER — Other Ambulatory Visit: Payer: Self-pay | Admitting: Physician Assistant

## 2024-04-02 DIAGNOSIS — M542 Cervicalgia: Secondary | ICD-10-CM

## 2024-04-02 DIAGNOSIS — R519 Headache, unspecified: Secondary | ICD-10-CM

## 2024-04-08 ENCOUNTER — Encounter: Attending: Family | Admitting: Dietician

## 2024-04-08 ENCOUNTER — Encounter: Payer: Self-pay | Admitting: Dietician

## 2024-04-08 DIAGNOSIS — R636 Underweight: Secondary | ICD-10-CM | POA: Diagnosis not present

## 2024-04-08 DIAGNOSIS — F4310 Post-traumatic stress disorder, unspecified: Secondary | ICD-10-CM | POA: Diagnosis not present

## 2024-04-08 NOTE — Progress Notes (Signed)
 Medical Nutrition Therapy  Appointment Start time:  1030  Appointment End time:  1115  Referral diagnosis: underweight due to inadequate caloric intake Preferred learning style: no preference indicated Learning readiness: ready   NUTRITION ASSESSMENT   Anthropometrics   Wt Readings from Last 3 Encounters:  03/26/24 130 lb 8 oz (59.2 kg)  03/05/24 129 lb 3.2 oz (58.6 kg)  01/29/24 129 lb 12.8 oz (58.9 kg)   Clinical Medical Hx: acid reflux, bipolar disorder, depression Medications: reviewed Labs: reviewed Notable Signs/Symptoms: none reported Food Allergies: shellfish  Lifestyle & Dietary Hx  Pt reports her birthday is coming up which she is excited for. Pt states things are going better since previous visit. Pt reports she was struggling mentally, with a lot of negative self-talk, but is now feeling more confident. Pt states she has been spending more time with her sister and family, and finds that her eating habits are more consistent when spending time with family due to their positivity and healthy relationships.   Pt states she has been cooking more and wants to try new dishes. Pt reports she has made a fermented noodle dish with pork, and spicy beef noodles. Pt reports she tried regular yogurt and liked it, and has been eating breakfast about half the week.   Pt reports doing some journaling and finds that this helps with stress management.   Pt states she has been drinking more water, typically 3 bottles daily.   Pt states she is considering going to gym, and also wants to go to outpatient rehab for her shoulder.   Pt reports she started going back to group therapy and enjoys it and feels that the people there are like family to her.   Pt reports she does not think she wants to be vegan anymore, and states she thinks this was restrictive for her and wants to still incorporate some vegan/vegetarian meals but not put a label on herself so that she can enjoy the other meals  with animal products that she loves.   Estimated daily fluid intake: 48 oz Supplements: vitamin D  Sleep: 11pm-7am. On Tues/Thurs pt goes back to sleep from 8:30am-10am.  Stress / self-care: moderate stress Current average weekly physical activity: ADLs, wants to start back at gym.   24-Hr Dietary Recall First Meal: 10am: none OR yoplait yogurt with 1 egg OR 2 scrambled eggs with rice Snack: none Second Meal: 3pm: ham and cheese sandwich Snack: watermelon Third Meal: beef ribs with bbq sauce and cherries Snack: none Beverages: water, occasional starbucks pink drink   NUTRITION DIAGNOSIS  NB-1.5 Disordered eating pattern As related to skipping meal.  As evidenced by skipping breakfast, report of intentionally skipping when dealing with mental health.   NUTRITION INTERVENTION  Nutrition education (E-1) on the following topics:   Stress Impact on Nutrition:    - Stress and anxiety can disrupt eating habits, leading to emotional eating (overeating comfort foods) or a loss of appetite (skipping meals or under-eating). Both can negatively impact nutrition.  Joyful Movement Finding an exercise you enjoy is crucial for maintaining long-term fitness and overall health. Enjoyable activities are more likely to become regular habits, making it easier to stay consistent with physical activity. When you look forward to your workouts, exercise becomes a positive experience rather than a chore, reducing the likelihood of burnout or quitting. Enjoyable exercise also enhances mental well-being, as engaging in activities you love can boost mood, reduce stress, and provide a sense of accomplishment.   Discussed  importance of incorporating a protein source if having a vegetarian or vegan meal, including options and examples: egg, tofu, edamame, beans/legumes, peanut butter, nuts/seeds, soymilk, etc.)  Encouraged pt to incorporate breakfast. Discussed ideas for smoothies to incorporate in the morning  (frozen fruit, greek yogurt, peanut butter, soymilk, etc). Discussed other breakfast options.   Discussed importance of eating consistently throughout the day (especially if hoping to eat more vegetarian/vegan meals), ideally following a schedule of: -Breakfast -Snack (spaced 2 hours between) -Lunch -Snack (spaced 2 hours between) -Dinner   Hydration:  Adequate hydration is crucial for optimal nutrition and overall health. It aids in the digestion and absorption of nutrients, ensuring the body can effectively process and transport them to cells. Overall, staying well-hydrated is essential for the body to utilize nutrients efficiently and maintain balance.   Handouts Provided Include (previous assessment) Build a smoothie sheet  Learning Style & Readiness for Change Teaching method utilized: Visual & Auditory  Demonstrated degree of understanding via: Teach Back  Barriers to learning/adherence to lifestyle change: none  Goals Established by Pt  New Goals:   Goal: go walking with mom 2 times per week.   Goal: go to the gym 3 times per week.   Goal: attend group therapy weekly and attend 1-on-1 therapy in person.   Assessment of Previous Goals:   Goal: Come up with 3 things you are thankful for each week as a journal entry. - goal in progress, did some journaling.   Goal: Go walking with your cat once a week. - goal not met, changed to walking with mom.   Goal: Eat breakfast at least 3 times per week (ideas: avocado toast with egg, yogurt and granola and egg on side). - goal met, continue.   Goal: incorporate a vegetarian meal 3 times a week (with tofu/edamame + vegetables + rice/noodles + healthy fat). - goal not met, continue.   MONITORING & EVALUATION Dietary intake, weekly physical activity, and follow up in 4-6 weeks.  Next Steps  Patient is to call for questions.

## 2024-04-09 ENCOUNTER — Ambulatory Visit: Admitting: Allergy

## 2024-04-16 DIAGNOSIS — F4310 Post-traumatic stress disorder, unspecified: Secondary | ICD-10-CM | POA: Diagnosis not present

## 2024-04-23 DIAGNOSIS — F3132 Bipolar disorder, current episode depressed, moderate: Secondary | ICD-10-CM | POA: Diagnosis not present

## 2024-04-23 DIAGNOSIS — F4310 Post-traumatic stress disorder, unspecified: Secondary | ICD-10-CM | POA: Diagnosis not present

## 2024-04-30 DIAGNOSIS — F4310 Post-traumatic stress disorder, unspecified: Secondary | ICD-10-CM | POA: Diagnosis not present

## 2024-05-04 DIAGNOSIS — K130 Diseases of lips: Secondary | ICD-10-CM | POA: Diagnosis not present

## 2024-05-04 DIAGNOSIS — L853 Xerosis cutis: Secondary | ICD-10-CM | POA: Diagnosis not present

## 2024-05-04 DIAGNOSIS — Z5181 Encounter for therapeutic drug level monitoring: Secondary | ICD-10-CM | POA: Diagnosis not present

## 2024-05-04 DIAGNOSIS — L700 Acne vulgaris: Secondary | ICD-10-CM | POA: Diagnosis not present

## 2024-05-07 DIAGNOSIS — F4310 Post-traumatic stress disorder, unspecified: Secondary | ICD-10-CM | POA: Diagnosis not present

## 2024-05-14 DIAGNOSIS — F4310 Post-traumatic stress disorder, unspecified: Secondary | ICD-10-CM | POA: Diagnosis not present

## 2024-05-19 ENCOUNTER — Ambulatory Visit: Admitting: Dietician

## 2024-05-21 DIAGNOSIS — F4310 Post-traumatic stress disorder, unspecified: Secondary | ICD-10-CM | POA: Diagnosis not present

## 2024-05-25 DIAGNOSIS — F3132 Bipolar disorder, current episode depressed, moderate: Secondary | ICD-10-CM | POA: Diagnosis not present

## 2024-05-26 DIAGNOSIS — F4310 Post-traumatic stress disorder, unspecified: Secondary | ICD-10-CM | POA: Diagnosis not present

## 2024-06-02 DIAGNOSIS — F4310 Post-traumatic stress disorder, unspecified: Secondary | ICD-10-CM | POA: Diagnosis not present

## 2024-06-03 ENCOUNTER — Ambulatory Visit: Attending: Physician Assistant

## 2024-06-03 NOTE — Therapy (Incomplete)
 OUTPATIENT PHYSICAL THERAPY CERVICAL EVALUATION   Patient Name: Kaitlyn Crane MRN: [id number] DOB:1996/05/18, 28 y.o., female Today's Date: 06/03/2024  END OF SESSION:   Past Medical History:  Diagnosis Date   Infection 10/15/2010   UTERUS ?   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  Rationale for Evaluation and Treatment: Rehabilitation  ONSET DATE: ***  SUBJECTIVE:                                                                                                                                                                                                         SUBJECTIVE STATEMENT: Patient presents to PT d/t neck pain that ***. She had previous decrease in symptoms with anti-inflammatories and mm relaxers, but the pan has returned.   Hand dominance: {MISC; OT HAND [phone]}  PERTINENT HISTORY:  Relevant PMHx includes B12 deficiency, Anemia, and chronicity of symptoms; referring  provider also notes history of vitamin D  deficiency in most recent office note.   PAIN:  Are you having pain? Yes: NPRS scale: *** Pain location: *** Pain description: *** Aggravating factors: turning her head  Relieving factors: ***  PRECAUTIONS: {Therapy precautions:24002}  RED FLAGS: {PT Red Flags:29287}     WEIGHT BEARING RESTRICTIONS: {Yes ***/No:24003}  FALLS:  Has patient fallen in last 6 months? {fallsyesno:27318}  LIVING ENVIRONMENT: Lives with: {[facility] lives with:25569::lives with their family} Lives in: {Lives in:25570} Stairs: {opstairs:27293} Has following equipment at home: {Assistive devices:23999}  OCCUPATION: ***  PLOF: {PLOF:24004}  PATIENT GOALS: ***  NEXT MD  VISIT: ***  OBJECTIVE:  Note: Objective measures were completed at Evaluation unless otherwise noted.  DIAGNOSTIC FINDINGS:  ***  PATIENT SURVEYS:  NDI:  NECK DISABILITY INDEX  Date: 06/03/24 Score  Pain intensity {NDI-1:32931}  2. Personal care (washing, dressing, etc.) {NDI-2:32932}  3. Lifting {NDI-3:32933}  4. Reading {NDI-4:32934}  5. Headaches {NDI-5:32935}  6. Concentration {NDI-6:32936}  7. Work {NDI-7:32937}  8. Driving {WIP-1:67061}  9. Sleeping {NDI-9:32939}  10. Recreation {NDI-10:32940}  Total ***/50   Minimum Detectable Change (90% confidence): 5 points or 10% points  COGNITION: Overall cognitive status: Within functional limits for tasks assessed  SENSATION: {sensation:27233}  POSTURE: {posture:25561}  PALPATION: ***   CERVICAL ROM:   Active ROM A/PROM (deg) eval  Flexion   Extension   Right lateral flexion   Left lateral flexion   Right rotation   Left rotation    (Blank rows = not tested)  UPPER EXTREMITY ROM:  Active ROM Right eval Left eval  Shoulder flexion    Shoulder extension    Shoulder abduction    Shoulder adduction    Shoulder extension    Shoulder internal rotation    Shoulder external rotation    Elbow flexion    Elbow  extension    Wrist flexion    Wrist extension    Wrist ulnar deviation    Wrist radial deviation    Wrist pronation    Wrist supination     (Blank rows = not tested)  UPPER EXTREMITY MMT:  MMT Right eval Left eval  Shoulder flexion    Shoulder extension    Shoulder abduction    Shoulder adduction    Shoulder extension    Shoulder internal rotation    Shoulder external rotation    Middle trapezius    Lower trapezius    Elbow flexion    Elbow extension    Wrist flexion    Wrist extension    Wrist ulnar deviation    Wrist radial deviation    Wrist pronation    Wrist supination    Grip strength     (Blank rows = not tested)  CERVICAL SPECIAL TESTS:  {Cervical special tests:25246}  FUNCTIONAL TESTS:  {Functional tests:24029}  TREATMENT DATE:   [facility] Adult PT Treatment:                                                DATE:  06/03/2024  Initial evaluation: see patient education and home exercise program as noted below                                                                                                                                   PATIENT EDUCATION:  Education details: reviewed initial home exercise program; discussion of POC, prognosis and goals for skilled PT  Person educated: Patient Education method: Explanation, Demonstration, and Handouts Education comprehension: verbalized understanding, returned demonstration, and needs further education  HOME EXERCISE PROGRAM: ***  ASSESSMENT:  CLINICAL IMPRESSION: [name] is a 28 y.o. female who was seen today for physical therapy evaluation and treatment for ***. She is demonstrating ***. She has related pain and difficulty with ***. She requires skilled PT services at this time to address relevant deficits and improve overall function.     OBJECTIVE IMPAIRMENTS: {opptimpairments:25111}.   ACTIVITY LIMITATIONS: {activitylimitations:27494}  PARTICIPATION LIMITATIONS:  {participationrestrictions:25113}  PERSONAL FACTORS: {Personal factors:25162} are also affecting patient's functional outcome.   REHAB POTENTIAL: {rehabpotential:25112}  CLINICAL DECISION MAKING: {clinical decision making:25114}  EVALUATION COMPLEXITY: {Evaluation complexity:25115}   GOALS: Goals reviewed with patient? YES  SHORT TERM GOALS: Target date: ***  Patient will be independent with initial home program at least 3 days/week.  Baseline: provided at eval Goal Status: INITIAL   2.  Patient will demonstrate improved postural awareness for at least 15 minutes while seated without need for cueing from PT.  Baseline: see objective measures Goal Status: INITIAL   3.  *** Baseline:  Goal status: INITIAL   LONG TERM GOALS: Target date: ***  Patient will report improved overall functional ability with *** score of ***.  Baseline:  Goal Status: INITIAL    2.  *** Baseline:  Goal status: INITIAL  3.  *** Baseline:  Goal status: INITIAL  4.  *** Baseline:  Goal status: INITIAL     PLAN:  PT FREQUENCY: 1-2x/week  PT DURATION: 6 weeks  PLANNED INTERVENTIONS: 97164- PT Re-evaluation, 97750- Physical Performance Testing, 97110-Therapeutic exercises, 97530- Therapeutic activity, [id number]- Neuromuscular re-education, 97535- Self Care, 02859- Manual therapy, G0283- Electrical stimulation (unattended), [id number]- Electrical stimulation (manual), [id number]- Traction (mechanical), 20560 (1-2 muscles), 20561 (3+ muscles)- Dry Needling, Patient/Family education, Taping, Spinal mobilization, Cryotherapy, and Moist heat  PLAN FOR NEXT SESSION: PIERRETTE Marko Molt, PT, DPT  06/03/2024 8:35 AM

## 2024-06-09 DIAGNOSIS — F4310 Post-traumatic stress disorder, unspecified: Secondary | ICD-10-CM | POA: Diagnosis not present

## 2024-06-11 ENCOUNTER — Encounter: Payer: Self-pay | Admitting: Allergy

## 2024-06-11 ENCOUNTER — Other Ambulatory Visit: Payer: Self-pay

## 2024-06-11 ENCOUNTER — Ambulatory Visit (INDEPENDENT_AMBULATORY_CARE_PROVIDER_SITE_OTHER): Admitting: Allergy

## 2024-06-11 VITALS — BP 110/76 | HR 82 | Temp 98.3°F | Resp 14 | Ht 61.0 in | Wt 121.1 lb

## 2024-06-11 DIAGNOSIS — T7800XD Anaphylactic reaction due to unspecified food, subsequent encounter: Secondary | ICD-10-CM

## 2024-06-11 DIAGNOSIS — H9201 Otalgia, right ear: Secondary | ICD-10-CM | POA: Diagnosis not present

## 2024-06-11 DIAGNOSIS — J45990 Exercise induced bronchospasm: Secondary | ICD-10-CM

## 2024-06-11 DIAGNOSIS — H1013 Acute atopic conjunctivitis, bilateral: Secondary | ICD-10-CM | POA: Diagnosis not present

## 2024-06-11 DIAGNOSIS — L2389 Allergic contact dermatitis due to other agents: Secondary | ICD-10-CM | POA: Diagnosis not present

## 2024-06-11 DIAGNOSIS — J3089 Other allergic rhinitis: Secondary | ICD-10-CM

## 2024-06-11 MED ORDER — EPIPEN 2-PAK 0.3 MG/0.3ML IJ SOAJ: 0.3000 mg | INTRAMUSCULAR | 1 refills | Status: DC | PRN

## 2024-06-11 MED ORDER — TRIAMCINOLONE ACETONIDE 0.1 % EX OINT: TOPICAL_OINTMENT | CUTANEOUS | 2 refills | Status: AC

## 2024-06-11 MED ORDER — CROMOLYN SODIUM 4 % OP SOLN
1.0000 [drp] | Freq: Four times a day (QID) | OPHTHALMIC | 5 refills | Status: DC | PRN
Start: 2024-06-11 — End: 2024-07-10

## 2024-06-11 MED ORDER — VENTOLIN HFA 108 (90 BASE) MCG/ACT IN AERS: 2.0000 | INHALATION_SPRAY | Freq: Four times a day (QID) | RESPIRATORY_TRACT | 1 refills | Status: AC | PRN

## 2024-06-11 MED ORDER — LEVOCETIRIZINE DIHYDROCHLORIDE 5 MG PO TABS: 5.0000 mg | ORAL_TABLET | Freq: Every evening | ORAL | 5 refills | Status: DC

## 2024-06-11 MED ORDER — IPRATROPIUM BROMIDE 0.06 % NA SOLN: NASAL | 3 refills | Status: AC

## 2024-06-11 NOTE — Patient Instructions (Addendum)
 Ear pain - Right ear appears to have a ruptured drum.  Call your ENT today to get an appointment for treatment options   Rhinitis - Continue avoidance measures for dust mites. - For allergy  symptoms and itching with exercise take Xyzal  5mg  daily - For nasal congestion/drainage can use Atrovent  2 sprays each nostril up to 3-4 times a day as needed.  - For itchy/watery eyes can use Cromolyn  1 drop each eye 3-4 times a day as needed   Contact dermatitis (contact allergy ) - patch testing was positive to nickel and carba mix.  Do your best to avoid these products.   - if you develop rash (red, irritated, dry, itchy, patchy, scaly, flaky) only areas on the body then you can use Triamcinolone  0.1 % ointment twice a day as needed. - keep skin moisturized especially after bathing  Food allergy  - continue avoidance of shellfish in diet - will get back on Xolair  injections to help in decreasing risk of reaction if you have accidental ingestion of shellfish in diet.  Benefits, risk and protocol discussed and informational brochure provided.  - have access to self-injectable epinephrine  (Epipen  or AuviQ) 0.3mg  at all times - follow emergency action plan in case of allergic reaction  Exercise-induced asthma -have access to albuterol  inhaler 2 puffs every 4-6 hours as needed for cough/wheeze/shortness of breath/chest tightness.  May use 15-20 minutes prior to activity.   Monitor frequency of use.     Routine follow-up in 6 months or sooner if needed

## 2024-06-11 NOTE — Progress Notes (Unsigned)
 Follow-up Note  RE: Kaitlyn Crane MRN: [id number] DOB: 1996-08-18 Date of Office Visit: 06/11/2024   History of present illness: Kaitlyn Crane is a 28 y.o. female presenting today for follow-up of laboratory reaction, allergic rhinitis, contact dermatitis, exercise-induced asthma. She was last seen in the office on 05/17/2023 by myself. Discussed the use of AI scribe software for clinical note transcription with the patient, who gave verbal consent to proceed.  She experienced a severe allergic reaction a few months ago, characterized by swollen eyes and difficulty breathing and swallowing.  She tried taking Xyzal  but did not alleviate her symptoms.  She did go to her PCPs office for this reaction and states she was given medications there which did not seem to be very effective either.  However she states she did not require an EpiPen  during this episode. She is allergic to shellfish and suspects a sauce used for cooking may have triggered the reaction, although she is unsure.  She has been off Xolair  injections for almost a year due to a lapse in insurance coverage and a missed appointment. Her last documented Xolair  injection was in January of this year.  She would like to get back on the Xolair .  She started taking Accutane three months ago and has experienced lip peeling and small bumps on her lips, which she suspects may be related to the medication. She has not been in the sun.  She has not used her EpiPen  recently and reports using her albuterol  inhaler about a year ago during a [place], where she experienced shortness of breath, tightness despite preemptive inhaler use. She has not used her triamcinolone  ointment recently but believes it was prescribed. She continues to take Xyzal  for sinus allergies but finds it less effective during active symptoms. She has not used her Flonase  nasal spray or eye drops recently.  She reports a recent issue with her right ear stating it has been  painful.  She uses that ear mostly for her earpods and does report using Q-tips.  Review of systems: 10pt ROS negative unless noted above in HPI   Past medical/social/surgical/family history have been reviewed and are unchanged unless specifically indicated below.  No changes  Medication List: Current Outpatient Medications  Medication Sig Dispense Refill   buPROPion (WELLBUTRIN XL) 150 MG 24 hr tablet Take 150 mg by mouth daily.     EPIPEN  2-PAK 0.3 MG/0.3ML SOAJ injection Inject 0.3 mg into the muscle as needed for anaphylaxis. 1 each 1   levocetirizine (XYZAL ) 5 MG tablet Take 5 mg by mouth every evening.     levonorgestrel  (MIRENA , 52 MG,) 20 MCG/DAY IUD used office stock     lurasidone (LATUDA) 40 MG TABS tablet Take 1 tablet by mouth daily.     meloxicam  (MOBIC ) 15 MG tablet Take 1 tablet (15 mg total) by mouth daily as needed (pain, headache). Wait 24 hours from the toradol  shot today (1:45 pm) to start 30 tablet 0   metFORMIN (GLUCOPHAGE-XR) 500 MG 24 hr tablet Take 500 mg by mouth 2 (two) times daily.     omalizumab  (XOLAIR ) 150 MG/ML prefilled syringe Inject 300 mg into the skin every 28 (twenty-eight) days. 2 mL 11   traZODone  (DESYREL ) 50 MG tablet Take 50 mg by mouth at bedtime.     triamcinolone  ointment (KENALOG ) 0.1 % Apply sparingly to affected areas twice daily as needed below face and neck 80 g 5   VENTOLIN  HFA 108 (90 Base) MCG/ACT inhaler  Inhale 2 puffs into the lungs every 6 (six) hours as needed for wheezing or shortness of breath. 18 g 1   cyclobenzaprine  (FLEXERIL ) 5 MG tablet Take 1 tablet (5 mg total) by mouth at bedtime as needed (muscle pain). (Patient not taking: Reported on 06/11/2024) 30 tablet 0   ISOtretinoin (ACCUTANE) 40 MG capsule Take 40 mg by mouth 2 (two) times daily. (Patient not taking: Reported on 06/11/2024)     omeprazole (PRILOSEC) 20 MG capsule Take 20 mg by mouth 2 (two) times daily before a meal. (Patient not taking: Reported on 06/11/2024)      Current Facility-Administered Medications  Medication Dose Route Frequency Provider Last Rate Last Admin   omalizumab  (XOLAIR ) prefilled syringe 300 mg  300 mg Subcutaneous Q28 days Jeneal Danita Macintosh, MD   300 mg at 10/21/23 1536     Known medication allergies: Allergies  Allergen Reactions   Shellfish Allergy  Other (See Comments)    Swelling of tonsils   Carbamide Peroxide Rash   Nickel Sulfate [Nickel] Rash     Physical examination: Blood pressure 110/76, pulse 82, temperature 98.3 F (36.8 C), temperature source Temporal, resp. rate 14, height 5' 1 (1.549 m), weight 121 lb 1.6 oz (54.9 kg), SpO2 97%.  General: Alert, interactive, in no acute distress. HEENT: PERRLA, rt TM appears ruptured with blood in canal, lt TMs pearly gray, turbinates minimally edematous without discharge, post-pharynx non erythematous. Neck: Supple without lymphadenopathy. Lungs: Clear to auscultation without wheezing, rhonchi or rales. {no increased work of breathing. CV: Normal S1, S2 without murmurs. Abdomen: Nondistended, nontender. Skin: Warm and dry, without lesions or rashes. Extremities:  No clubbing, cyanosis or edema. Neuro:   Grossly intact.  Diagnostics/Labs:  Spirometry: FEV1: 2.11L 82%, FVC: 2.44L 83%, ratio consistent with nonobstructive pattern,  Assessment and plan: Ear pain - Right ear appears to have a ruptured drum.  Call your ENT today to get an appointment for treatment options   Rhinitis - Continue avoidance measures for dust mites. - For allergy  symptoms and itching with exercise take Xyzal  5mg  daily - For nasal congestion/drainage can use Atrovent  2 sprays each nostril up to 3-4 times a day as needed.  - For itchy/watery eyes can use Cromolyn  1 drop each eye 3-4 times a day as needed   Contact dermatitis (contact allergy ) - patch testing was positive to nickel and carba mix.  Do your best to avoid these products.   - if you develop rash (red, irritated, dry,  itchy, patchy, scaly, flaky) only areas on the body then you can use Triamcinolone  0.1 % ointment twice a day as needed. - keep skin moisturized especially after bathing  Food allergy  - continue avoidance of shellfish in diet - will get back on Xolair  injections to help in decreasing risk of reaction if you have accidental ingestion of shellfish in diet.  Benefits, risk and protocol discussed and informational brochure provided.  - have access to self-injectable epinephrine  (Epipen  or AuviQ) 0.3mg  at all times - follow emergency action plan in case of allergic reaction  Exercise-induced asthma -have access to albuterol  inhaler 2 puffs every 4-6 hours as needed for cough/wheeze/shortness of breath/chest tightness.  May use 15-20 minutes prior to activity.   Monitor frequency of use.    Routine follow-up in 6 months or sooner if needed  I appreciate the opportunity to take part in Seleta's care. Please do not hesitate to contact me with questions.  Sincerely,   Danita Jeneal, MD Allergy /Immunology [facility]

## 2024-06-12 DIAGNOSIS — L700 Acne vulgaris: Secondary | ICD-10-CM | POA: Diagnosis not present

## 2024-06-12 DIAGNOSIS — H9211 Otorrhea, right ear: Secondary | ICD-10-CM | POA: Diagnosis not present

## 2024-06-12 DIAGNOSIS — H7291 Unspecified perforation of tympanic membrane, right ear: Secondary | ICD-10-CM | POA: Diagnosis not present

## 2024-06-16 DIAGNOSIS — F4310 Post-traumatic stress disorder, unspecified: Secondary | ICD-10-CM | POA: Diagnosis not present

## 2024-06-17 ENCOUNTER — Ambulatory Visit: Attending: Physician Assistant | Admitting: Physical Therapy

## 2024-06-17 ENCOUNTER — Encounter: Payer: Self-pay | Admitting: Physical Therapy

## 2024-06-17 ENCOUNTER — Other Ambulatory Visit: Payer: Self-pay

## 2024-06-17 DIAGNOSIS — M6281 Muscle weakness (generalized): Secondary | ICD-10-CM | POA: Insufficient documentation

## 2024-06-17 DIAGNOSIS — R293 Abnormal posture: Secondary | ICD-10-CM | POA: Insufficient documentation

## 2024-06-17 DIAGNOSIS — M542 Cervicalgia: Secondary | ICD-10-CM | POA: Insufficient documentation

## 2024-06-17 DIAGNOSIS — M7989 Other specified soft tissue disorders: Secondary | ICD-10-CM | POA: Insufficient documentation

## 2024-06-17 NOTE — Therapy (Signed)
 OUTPATIENT PHYSICAL THERAPY CERVICAL EVALUATION   Patient Name: Kaitlyn Crane MRN: [id number] DOB:1995-12-14, 28 y.o., female Today's Date: 06/17/2024  END OF SESSION:  PT End of Session - 06/17/24 1000     Visit Number 1    Number of Visits 13    Date for PT Re-Evaluation 07/29/24    PT Start Time 0956    PT Stop Time 1034    PT Time Calculation (min) 38 min    Activity Tolerance Patient tolerated treatment well    Behavior During Therapy [facility] for tasks assessed/performed          Past Medical History:  Diagnosis Date   Infection 10/15/2010   UTERUS ?   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  WEIGHT BEARING RESTRICTIONS: No  FALLS:  Has patient fallen in last 6 months? No  LIVING ENVIRONMENT: Lives  with: lives with their family Lives in: House/apartment Stairs: No Has following equipment at home: None  OCCUPATION: Architectural technologist, all encompassing   PRECAUTIONS: None ---------------------------------------------------------------------------------------------  SUBJECTIVE:                                                                                                                                                                                                         SUBJECTIVE STATEMENT: Eval statement 06/17/2024: pain ongoing for 6 months, was sleeping on a stiff couch on L side and woke up with this  L shoulder pain/stiffness. Initially medication helped remedy symptoms, however, came back once prescription ran out. Some numbness around L shoulder, difficulty with dressing and overhead mobility. Pain gets worse with prolonged activity Pain currently 3/10, has n/t down LUE, primarily in L deltoid area Hand dominance: Right  RED FLAGS: None   PLOF: Independent  PATIENT GOALS: help the pain  NEXT MD VISIT: need to schedule ---------------------------------------------------------------------------------------------  OBJECTIVE:  Note: Objective measures were completed at Evaluation unless otherwise noted.  DIAGNOSTIC FINDINGS:  No pertinent recent imaging  PATIENT SURVEYS:  NDI:14/50 (28%)  COGNITION: Overall cognitive status: Within functional limits for tasks assessed  SENSATION: WFL  POSTURE: rounded shoulders and forward head  PALPATION: Tenderness on LUT   CERVICAL ROM:   Active ROM A/PROM (deg) eval  Flexion 80%  Extension 80%  Right lateral flexion 70%  Left lateral flexion 70%  Right rotation 70%  Left rotation 70%   (Blank rows = not tested)  ! Indicates pain with testing  UPPER EXTREMITY ROM:  Active ROM Right eval Left eval  Shoulder flexion [facility]  Shoulder extension    Shoulder abduction [facility]  Shoulder adduction     Shoulder extension    Shoulder internal rotation    Shoulder external rotation    Elbow flexion    Elbow extension    Wrist flexion    Wrist extension    Wrist ulnar deviation    Wrist radial deviation    Wrist pronation    Wrist supination     (Blank rows = not tested) ! Indicates pain with testing  UPPER EXTREMITY MMT:  MMT Right eval Left eval  Shoulder flexion    Shoulder extension    Shoulder abduction    Shoulder adduction    Shoulder extension    Shoulder internal rotation    Shoulder external rotation    Middle trapezius    Lower trapezius    Elbow flexion    Elbow extension    Wrist flexion    Wrist extension    Wrist ulnar deviation    Wrist radial deviation    Wrist pronation    Wrist supination    Grip strength     (Blank rows = not tested)  ! Indicates pain with testing   CERVICAL SPECIAL TESTS:  Upper limb tension test (ULTT): Positive, Spurling's test: Negative, and Distraction test: Negative  [facility] Adult PT Treatment:                                                DATE: 06/17/2024 Self Care: Pt education POC discussion                                                                                                                              PATIENT EDUCATION:  Education details: Pt  received education regarding HEP performance, ADL performance, functional activity tolerance, impairment education, appropriate performance of therapeutic activities. Person educated: Patient Education method: Explanation, Demonstration, Tactile cues, Verbal cues, and Handouts Education comprehension: verbalized understanding and returned demonstration  HOME EXERCISE PROGRAM: Access Code: [email or web address] URL: [email or web address] Date: 06/17/2024 Prepared by: Mabel Kiang  Exercises - Seated Gentle Upper Trapezius Stretch  - 1 x daily - 7 x weekly - 2-3 sets - 1 reps - 45s hold - Quadruped Thoracic Rotation with Hand on Neck  - 1 x daily - 7 x  weekly - 2-3 sets - 12 reps - 2s hold - Seated Cervical Retraction  - 1 x daily - 4 x weekly - 3 sets - 10 reps - 5s hold - Standing Shoulder Row with Anchored Resistance  - 1 x daily - 4 x weekly - 2-3 sets - 12 reps - 3s hold ---------------------------------------------------------------------------------------------  ASSESSMENT:  CLINICAL IMPRESSION:  Eval impression (06/17/2024): Pt. attended today's physical therapy session for evaluation of neck pain. Pt has complaints of 3/10 pain onset past 6 months after sleeping poorly on a couch. Pain is currently interrupting ability to perform ADLs and activity tolerance. Pt has notable deficits and would beenfit from therapeutic focus on posture,resting tone in L upper traps, cervical mobility, and postural endurance/strength.  Treatment performed today focused on pt education detailed in the objective. Pt demonstrated great understanding of education provided. required minimal v/tn cues and no assistance for appropriate performance with today's activities. Pt requires the intervention of skilled outpatient physical therapy to address the aforementioned deficits and progress towards a functional level in line with therapeutic goals.    OBJECTIVE IMPAIRMENTS: decreased activity tolerance, decreased mobility, decreased ROM, impaired perceived functional ability, impaired tone, impaired UE functional use, improper body mechanics, postural dysfunction, and pain.   ACTIVITY LIMITATIONS: carrying, lifting, dressing, and reach over head  PARTICIPATION LIMITATIONS: cleaning, laundry, driving, community activity, and occupation  PERSONAL FACTORS: Behavior pattern, Past/current experiences, and Time since onset of injury/illness/exacerbation are also affecting patient's functional outcome.   REHAB POTENTIAL: Good  CLINICAL DECISION MAKING: Stable/uncomplicated  EVALUATION COMPLEXITY: Low   GOALS: Goals reviewed with patient? Yes  SHORT TERM GOALS:  Target date: 07/08/2024  Pt will be independent with administered HEP to demonstrate the competency necessary for long term managemnet of symptoms at home.  Baseline:  Goal status: INITIAL  LONG TERM GOALS: Target date: 07/29/2024  Pt. Will achieve a NDI score of 8/50 (16%) as to demonstrate improvement in self-perceived functional ability with daily activities. Baseline: 14/50 (28%) Goal status: INITIAL  2.  Pt will report pain levels improving during ADLs to be less than or equal to 1/10 as to demonstrate improved tolerance with daily functional activities such as dressing or driving.  Baseline:  Goal status: INITIAL  3.   Pt will improve Cervical AROM to 90% of standardized norms with less than 1/10 pain to demonstrate necessary mobility for high quality and safe ADLs  Baseline:  Goal status: INITIAL ---------------------------------------------------------------------------------------------  PLAN:  PT FREQUENCY: 1-2x/week  PT DURATION: 6 weeks  PLANNED INTERVENTIONS: 97110-Therapeutic exercises, 97530- Therapeutic activity, 97112- Neuromuscular re-education, 97535- Self Care, 02859- Manual therapy, Patient/Family education, Taping, Joint mobilization, Spinal mobilization, and Moist heat  PLAN FOR NEXT SESSION: review HEP, Begin POC as detailed In assessment.   Mabel JONELLE Kiang, PT, DPT 06/17/2024, 10:34 AM     For all possible CPT codes, reference the Planned Interventions line above.     Check all conditions that are expected  to impact treatment: {Conditions expected to impact treatment:None of these apply

## 2024-06-19 DIAGNOSIS — H7291 Unspecified perforation of tympanic membrane, right ear: Secondary | ICD-10-CM | POA: Diagnosis not present

## 2024-06-19 DIAGNOSIS — H9211 Otorrhea, right ear: Secondary | ICD-10-CM | POA: Diagnosis not present

## 2024-06-25 ENCOUNTER — Telehealth: Payer: Self-pay | Admitting: *Deleted

## 2024-06-25 ENCOUNTER — Other Ambulatory Visit: Payer: Self-pay

## 2024-06-25 ENCOUNTER — Other Ambulatory Visit (HOSPITAL_COMMUNITY): Payer: Self-pay

## 2024-06-25 MED ORDER — XOLAIR 300 MG/2ML ~~LOC~~ SOSY
300.0000 mg | PREFILLED_SYRINGE | SUBCUTANEOUS | 11 refills | Status: AC
  Filled 2024-07-24: qty 2, 28d supply, fill #0
  Filled 2024-08-17: qty 2, 28d supply, fill #1
  Filled 2024-09-14: qty 2, 28d supply, fill #2
  Filled 2024-10-07: qty 2, 28d supply, fill #3
  Filled 2024-11-06: qty 2, 28d supply, fill #4

## 2024-06-25 NOTE — Telephone Encounter (Signed)
-----   Message from [name] sent at 06/11/2024  6:12 PM EDT ----- Pt needs to get back on Xolair  for food allergy .   Looks like she changed insurance and then never made appt to reapprove until now.

## 2024-06-25 NOTE — Telephone Encounter (Signed)
 Called patient and advised reapproval for XOlair  and restart will send Rx to [facility] and reach out once delivery set to make appt to restart

## 2024-06-26 DIAGNOSIS — L700 Acne vulgaris: Secondary | ICD-10-CM | POA: Diagnosis not present

## 2024-06-29 ENCOUNTER — Telehealth: Payer: Self-pay

## 2024-06-29 ENCOUNTER — Other Ambulatory Visit (HOSPITAL_COMMUNITY): Payer: Self-pay

## 2024-06-29 NOTE — Telephone Encounter (Signed)
 Per test claim-PA needed for Xolair  restart. Message sent to Tammy via Teams 08/11

## 2024-07-01 ENCOUNTER — Ambulatory Visit: Admitting: Physical Therapy

## 2024-07-01 ENCOUNTER — Ambulatory Visit: Admitting: Student

## 2024-07-01 ENCOUNTER — Encounter: Attending: Family | Admitting: Dietician

## 2024-07-01 ENCOUNTER — Encounter: Payer: Self-pay | Admitting: Student

## 2024-07-01 ENCOUNTER — Ambulatory Visit: Payer: Self-pay

## 2024-07-01 ENCOUNTER — Encounter: Payer: Self-pay | Admitting: Dietician

## 2024-07-01 VITALS — BP 98/72 | HR 66 | Temp 98.2°F | Resp 18 | Ht 61.0 in | Wt 126.0 lb

## 2024-07-01 VITALS — Wt 126.8 lb

## 2024-07-01 DIAGNOSIS — Z91013 Allergy to seafood: Secondary | ICD-10-CM | POA: Diagnosis not present

## 2024-07-01 DIAGNOSIS — R636 Underweight: Secondary | ICD-10-CM | POA: Insufficient documentation

## 2024-07-01 MED ORDER — EPIPEN 2-PAK 0.3 MG/0.3ML IJ SOAJ: 0.3000 mg | INTRAMUSCULAR | 1 refills | Status: DC | PRN

## 2024-07-01 MED ORDER — METHYLPREDNISOLONE ACETATE 40 MG/ML IJ SUSP
40.0000 mg | Freq: Once | INTRAMUSCULAR | Status: AC
  Administered 2024-07-01: 40 mg via INTRAMUSCULAR

## 2024-07-01 MED ORDER — LEVOCETIRIZINE DIHYDROCHLORIDE 5 MG PO TABS: 5.0000 mg | ORAL_TABLET | Freq: Every evening | ORAL | 1 refills | Status: AC

## 2024-07-01 MED ORDER — PREDNISONE 10 MG PO TABS: ORAL_TABLET | ORAL | 0 refills | Status: AC

## 2024-07-01 NOTE — Telephone Encounter (Signed)
 Appt scheduled

## 2024-07-01 NOTE — Progress Notes (Signed)
 Acute Office Visit  Subjective:     Patient ID: Kaitlyn Crane, female    DOB: [birth year], 28 y.o.   MRN: [id number]  Chief Complaint  Patient presents with   Allergic Reaction    Shellfish -- right eye onset 3 days    HPI Patient is in today for acute visit.  Patient has a known shellfish allergy  and on Sunday a different types of shellfish including called fish, shrimp.  Patient has been seen by allergist.  Reaction included  swollen eyes and difficulty breathing and swallowing.   History of Present Illness Kaitlyn Crane is a 28 year old female with a shellfish allergy  who presents with an allergic reaction after consuming shellfish.  She experienced symptoms after consuming shrimp and scallop, with a suspected trigger from shrimp paste in [place] beef noodles. Current symptoms include facial flushing and concern about throat constriction, along with a painful area near her eye. She has been prescribed Xyzal  5 mg daily but has not been taking it regularly. She misplaced a newly prescribed medication and is unsure of its name. She has an epinephrine  pen at home, but it may be expired.  She was previously on Xolair  injections for almost a year but had lapse in insurance coverage and has not been taking these injections with allergy . She has not used her EpiPen  recently   Patient denies fever, chills, SOB, CP, palpitations, dyspnea, edema, HA, vision changes, N/V/D, abdominal pain, urinary symptoms,  weight changes, and recent illness or hospitalizations.   ROS  See HPI    Objective:    BP 98/72   Pulse 66   Temp 98.2 F (36.8 C)   Resp 18   Ht 5' 1 (1.549 m)   Wt 126 lb (57.2 kg)   SpO2 97%   BMI 23.81 kg/m    Physical Exam  General: No acute distress. Awake and conversant.  Eyes: Normal conjunctiva, anicteric. Round symmetric pupils. +puffiness, erythema near eyes and cheeks, but breathing and swallowing are not affected. ENT: Hearing grossly intact. No nasal discharge.   Neck: Neck is supple. No masses or thyromegaly.  Respiratory: CTAB. Respirations are non-labored. No wheezing.  Skin: Warm. Dry. Psych: Alert and oriented. Cooperative CV: RRR. No murmur. No lower extremity edema.  MSK: Normal ambulation. No clubbing or cyanosis.  Neuro:  CN II-XII grossly normal.    No results found for any visits on 07/01/24.      Assessment & Plan:   Problem List Items Addressed This Visit     Shellfish allergy  - Primary   Relevant Medications   EPIPEN  2-PAK 0.3 MG/0.3ML SOAJ injection   predniSONE  (DELTASONE ) 10 MG tablet   levocetirizine (XYZAL ) 5 MG tablet   - avoidance of shellfish in diet -Continue Xyzal  5 mg daily,Rx refilled - Advise return to allergist to to get back on Xolair  injections to help decrease risk of reaction if you have accidental ingestion of shellfish in diet.    - have access to self-injectable epinephrine  (Epipen  or AuviQ) 0.3mg  at all times - follow emergency action plan in case of allergic reaction, Rx EpiPen  refilled - Administered depo 40 mg IM -Rx-prednisone  taper, start taking in AM - Advised Benadryl  25 mg at night for sleep and symptom relief. - Educated on avoiding shellfish and keeping EpiPen  accessible.  Meds ordered this encounter  Medications   EPIPEN  2-PAK 0.3 MG/0.3ML SOAJ injection    Sig: Inject 0.3 mg into the muscle as needed for anaphylaxis.    Dispense:  1 each    Refill:  1    Supervising Provider:   DOMENICA BLACKBIRD A [[id number]]   predniSONE  (DELTASONE ) 10 MG tablet    Sig: Take 4 tablets (40 mg total) by mouth daily with breakfast for 2 days, THEN 2 tablets (20 mg total) daily with breakfast for 2 days, THEN 1 tablet (10 mg total) daily with breakfast for 2 days.    Dispense:  14 tablet    Refill:  0    Supervising Provider:   DOMENICA BLACKBIRD A [[id number]]   levocetirizine (XYZAL ) 5 MG tablet    Sig: Take 1 tablet (5 mg total) by mouth every evening.    Dispense:  30 tablet    Refill:  1    Supervising Provider:    DOMENICA BLACKBIRD A [[id number]]   methylPREDNISolone  acetate (DEPO-MEDROL ) injection 40 mg    No follow-ups on file.  Janson Lamar L Suly Vukelich, NP

## 2024-07-01 NOTE — Telephone Encounter (Signed)
 Kaitlyn Crane working on approval-resubmitting to plan.

## 2024-07-01 NOTE — Telephone Encounter (Signed)
 FYI Only or Action Required?: Action required by provider: request for appointment.  Patient was last seen in primary care on 03/26/2024 by Cyndi Shaver, PA-C.  Called Nurse Triage reporting Allergic Reaction.  Symptoms began several days ago.  Interventions attempted: Prescription medications: xyzal  .  Symptoms are: unchanged/getting worse.  Triage Disposition: See Physician Within 24 Hours  Patient/caregiver understands and will follow disposition?: YesCopied from CRM #[id number]. Topic: Clinical - Red Word Triage >> Jul 01, 2024  8:02 AM Suzen RAMAN wrote: Red Word that prompted transfer to Nurse Triage: ALLERGIC REACTION due to seafood. Swollen eyes and hurts to blink Reason for Disposition  Face swelling is painful to touch  Answer Assessment - Initial Assessment Questions Pt allergic to shellfish. Pt ate  crawfish, crab legs, mussels on Sunday. Pt felt itchy after eating. Pt has taken XYZAL  twice and no improvement. I've done this before and they prescribe me something stronger.  Pt denies any SOB/other symptoms.       1. ONSET: When did the swelling start? (e.g., minutes, hours, days)     Sunday  2. LOCATION: What part of the face is swollen? (e.g., cheek, entire face, jaw joint area, under jaw)     Right eye  3. SEVERITY: How swollen is it?     Hard to see 4. ITCHING: Is there any itching? If Yes, ask: How much?   (Scale 1-10; mild, moderate or severe)     6 5. PAIN: Is the swelling painful to touch? If Yes, ask: How painful is it?   (Scale 0-10; mild, moderate or severe)     5 6. FEVER: Do you have a fever? If Yes, ask: What is it, how was it measured, and when did it start?      denies 7. CAUSE: What do you think is causing the face swelling?     Eating shellfish  8. NEW MEDICINES: Have there been any new medicines started recently?     denies 9. RECURRENT SYMPTOM: Have you had face swelling before? If Yes, ask: When was the last time?  What happened that time?     yes 10. OTHER SYMPTOMS: Do you have any other symptoms? (e.g., leg swelling, toothache)       denies  Protocols used: Face Swelling-A-AH

## 2024-07-01 NOTE — Progress Notes (Signed)
 Medical Nutrition Therapy  Appointment Start time:  [phone]  Appointment End time:  0919  Referral diagnosis: underweight due to inadequate caloric intake Preferred learning style: no preference indicated Learning readiness: ready   NUTRITION ASSESSMENT   Anthropometrics   Wt Readings from Last 3 Encounters:  07/01/24 126 lb 12.8 oz (57.5 kg)  06/11/24 121 lb 1.6 oz (54.9 kg)  03/26/24 130 lb 8 oz (59.2 kg)   Clinical Medical Hx: acid reflux, bipolar disorder, depression, vitamin d  deficiency Medications: reviewed Labs: reviewed Notable Signs/Symptoms: none reported Food Allergies: shellfish  Lifestyle & Dietary Hx  Pt reports she got a job at [employer] and has been working 12-5pm 3 days per week. Pt reports she is still in virtual classes. Pt reports since starting her job her eating schedule has been off. Pt states on the days she works she sleeps until she needs to go to work, so she does not eat before and waits until she gets off with first time eating being around 5pm.   Pt reports she had a cousin pass away and had to go spend time with the family for a week. Pt reports during this time one of her cousins called her fat. Pt states she feels like family members continue to comment on her weight/body. Pt reports because of this she talked to her doctor about her sweet cravings and she was prescribed metformin, which made her nauseated so she is also taking zofran .   Pt states she is still going to therapy, and group therapy but has not been going to group therapy as often even though she reports enjoying it.   Pt states she just started physical therapy for her shoulder. Pt reports she hopes to go back to the gym in the future with her nephew.   Estimated daily fluid intake: 48 oz Supplements: none Sleep: 11pm-7am. On Tues/Thurs pt goes back to sleep from 8:30am-10am.  Stress / self-care: moderate stress Current average weekly physical activity: ADLs, wants to start back at  gym.   24-Hr Dietary Recall First Meal: none Snack: none Second Meal: 12pm: ramen Snack: watermelon Third Meal: 5pm: rice, pork, vegetable Snack: none Beverages: water, occasional starbucks pink drink or boba   NUTRITION DIAGNOSIS  NB-1.5 Disordered eating pattern As related to skipping meal.  As evidenced by skipping breakfast, report of intentionally skipping when dealing with mental health.   NUTRITION INTERVENTION  Nutrition education (E-1) on the following topics:   Stress Impact on Nutrition:    - Stress and anxiety can disrupt eating habits, leading to emotional eating (overeating comfort foods) or a loss of appetite (skipping meals or under-eating). Both can negatively impact nutrition.  Joyful Movement Finding an exercise you enjoy is crucial for maintaining long-term fitness and overall health. Enjoyable activities are more likely to become regular habits, making it easier to stay consistent with physical activity. When you look forward to your workouts, exercise becomes a positive experience rather than a chore, reducing the likelihood of burnout or quitting. Enjoyable exercise also enhances mental well-being, as engaging in activities you love can boost mood, reduce stress, and provide a sense of accomplishment.   Discussed importance of incorporating a protein source if having a vegetarian or vegan meal, including options and examples: egg, tofu, edamame, beans/legumes, peanut butter, nuts/seeds, soymilk, etc.)  Encouraged pt to incorporate breakfast. Discussed ideas for smoothies to incorporate in the morning (frozen fruit, greek yogurt, peanut butter, soymilk, etc). Discussed other breakfast options.   Discussed importance  of eating consistently throughout the day (especially if hoping to eat more vegetarian/vegan meals), ideally following a schedule of: -Breakfast -Snack (spaced 2 hours between) -Lunch -Snack (spaced 2 hours between) -Dinner   Hydration:   Adequate hydration is crucial for optimal nutrition and overall health. It aids in the digestion and absorption of nutrients, ensuring the body can effectively process and transport them to cells. Overall, staying well-hydrated is essential for the body to utilize nutrients efficiently and maintain balance.   Handouts Provided Include (previous assessment) Build a smoothie sheet  Learning Style & Readiness for Change Teaching method utilized: Visual & Auditory  Demonstrated degree of understanding via: Teach Back  Barriers to learning/adherence to lifestyle change: none  Goals Established by Pt  New Goals:   Goal: make time for breakfast 3 days a week.   Goal: Aim to eat within 1 hour of waking up and every 3-5 hours following.   Assessment of Previous Goals:   Goal: go walking with mom 2 times per week. - goal met, continue. Pt reports she has been walking at the park.   Goal: go to the gym 3 times per week. - goal not met, in PT for shoulder, plans to go to the gym in the future.   Goal: attend group therapy weekly and attend 1-on-1 therapy in person. - goal met, continue.    MONITORING & EVALUATION Dietary intake, weekly physical activity, and follow up in 6 weeks.  Next Steps  Patient is to call for questions.

## 2024-07-03 ENCOUNTER — Other Ambulatory Visit: Payer: Self-pay

## 2024-07-03 DIAGNOSIS — H7291 Unspecified perforation of tympanic membrane, right ear: Secondary | ICD-10-CM | POA: Diagnosis not present

## 2024-07-06 ENCOUNTER — Encounter: Payer: Self-pay | Admitting: Physical Therapy

## 2024-07-06 ENCOUNTER — Ambulatory Visit: Admitting: Physical Therapy

## 2024-07-06 DIAGNOSIS — M7989 Other specified soft tissue disorders: Secondary | ICD-10-CM

## 2024-07-06 DIAGNOSIS — R293 Abnormal posture: Secondary | ICD-10-CM | POA: Diagnosis not present

## 2024-07-06 DIAGNOSIS — M6281 Muscle weakness (generalized): Secondary | ICD-10-CM | POA: Diagnosis not present

## 2024-07-06 DIAGNOSIS — M542 Cervicalgia: Secondary | ICD-10-CM

## 2024-07-06 NOTE — Therapy (Addendum)
 OUTPATIENT PHYSICAL THERAPY CERVICAL   PHYSICAL THERAPY DISCHARGE SUMMARY  Visits from Start of Care: 2  Current functional level related to goals / functional outcomes: See goals   Remaining deficits: Current status unknown   Education / Equipment: HEP   Patient agrees to discharge. Patient goals were not met. Patient is being discharged due to not returning since the last visit.  Joneen Fresh PT, DPT, LAT, ATC  08/14/24  7:50 AM    Patient Name: Kaitlyn Crane MRN: [id number] DOB:1996-02-03, 28 y.o., female Today's Date: 07/06/2024  END OF SESSION:  PT End of Session - 07/06/24 0938     Visit Number 2    Number of Visits 13    Date for Recertification  07/29/24    PT Start Time 0917    PT Stop Time 0955    PT Time Calculation (min) 38 min    Activity Tolerance Patient tolerated treatment well    Behavior During Therapy [facility] for tasks assessed/performed           Past Medical History:  Diagnosis Date   Infection 10/15/2010   UTERUS ?   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  WEIGHT BEARING RESTRICTIONS: No  FALLS:  Has patient fallen in last 6 months? No  LIVING ENVIRONMENT: Lives with: lives with their family Lives in: House/apartment Stairs: No Has following equipment at home: None  OCCUPATION: Architectural technologist, all encompassing   PRECAUTIONS: None ---------------------------------------------------------------------------------------------  SUBJECTIVE:  SUBJECTIVE STATEMENT: Pt attended today's session with reports of 3/10 pain. Pt stated that they have maintained poor compliance with current HEP.  Hasn't performed HEP, only pain medication has helped alleviate pain recently.    Eval statement 06/17/2024: pain ongoing for 6 months, was sleeping on a stiff couch on L side and woke up with this L shoulder pain/stiffness. Initially medication helped remedy symptoms, however, came back once prescription ran out. Some numbness around L shoulder, difficulty with dressing and overhead mobility. Pain gets worse with prolonged activity Pain currently 3/10, has n/t down LUE, primarily in L deltoid area Hand dominance: Right  RED FLAGS: None   PLOF: Independent  PATIENT GOALS: help the pain  NEXT MD VISIT: need to schedule ---------------------------------------------------------------------------------------------  OBJECTIVE:  Note: Objective measures were completed at Evaluation unless otherwise noted.  DIAGNOSTIC FINDINGS:  No pertinent  recent imaging  PATIENT SURVEYS:  NDI:14/50 (28%)  COGNITION: Overall cognitive status: Within functional limits for tasks assessed  SENSATION: WFL  POSTURE: rounded shoulders and forward head  PALPATION: Tenderness on LUT   CERVICAL ROM:   Active ROM A/PROM (deg) eval  Flexion 80%  Extension 80%  Right lateral flexion 70%  Left lateral flexion 70%  Right rotation 70%  Left rotation 70%   (Blank rows = not tested)  ! Indicates pain with testing  UPPER EXTREMITY ROM:  Active ROM Right eval Left eval  Shoulder flexion [facility]  Shoulder extension    Shoulder abduction [facility]  Shoulder adduction    Shoulder extension    Shoulder internal rotation    Shoulder external rotation    Elbow flexion    Elbow extension    Wrist flexion    Wrist extension    Wrist ulnar deviation    Wrist radial deviation    Wrist pronation    Wrist supination     (Blank rows = not tested) ! Indicates pain with testing  UPPER EXTREMITY MMT:  MMT Right eval Left eval  Shoulder flexion    Shoulder extension    Shoulder abduction    Shoulder adduction    Shoulder extension    Shoulder internal rotation    Shoulder external rotation    Middle trapezius    Lower trapezius    Elbow flexion    Elbow extension    Wrist flexion    Wrist extension    Wrist ulnar deviation    Wrist radial deviation    Wrist pronation    Wrist supination    Grip strength     (Blank rows = not tested)  ! Indicates pain with testing   CERVICAL SPECIAL TESTS:  Upper limb tension test (ULTT): Positive, Spurling's test: Negative, and Distraction test: Negative   [facility] Adult PT Treatment:                                                DATE: 07/06/2024  Therapeutic Exercise: PROM into cervical RSB with L UT release B UT stretch 2x1' Therapeutic Activity: UBE 3/3 OMEGA row, H. Grip 2x12, 15lbs, hold 1s Cue for upright posture and scapular retraction OMEGA Lat pull down 2x15, hold 1s,  15lbs HEP review, education on performance   [facility] Adult PT Treatment:  DATE: 06/17/2024 Self Care: Pt education POC discussion                                                                                                                              PATIENT EDUCATION:  Education details: Pt received education regarding HEP performance, ADL performance, functional activity tolerance, impairment education, appropriate performance of therapeutic activities. Person educated: Patient Education method: Explanation, Demonstration, Tactile cues, Verbal cues, and Handouts Education comprehension: verbalized understanding and returned demonstration  HOME EXERCISE PROGRAM: Access Code: [email or web address] URL: [email or web address] Date: 06/17/2024 Prepared by: Mabel Kiang  Exercises - Seated Gentle Upper Trapezius Stretch  - 1 x daily - 7 x weekly - 2-3 sets - 1 reps - 45s hold - Quadruped Thoracic Rotation with Hand on Neck  - 1 x daily - 7 x weekly - 2-3 sets - 12 reps - 2s hold - Seated Cervical Retraction  - 1 x daily - 4 x weekly - 3 sets - 10 reps - 5s hold - Standing Shoulder Row with Anchored Resistance  - 1 x daily - 4 x weekly - 2-3 sets - 12 reps - 3s hold ---------------------------------------------------------------------------------------------  ASSESSMENT:  CLINICAL IMPRESSION: Pt attended physical therapy session for continuation of treatment regarding neck/shoulder pain. Today's treatment focused on improvement of  upper trap motility, shoulder girdle mobility/strengthening, and cervicothoracic stability. Pt reports no change in symptoms since last session, has not been performing HEP. Reinforced education surrounding HEP performance and need for consistency to noticed change in symptoms. Pt showed good tolerance to administered treatment with no adverse effects by the end of session. Skilled intervention was utilized via  activity modification for pt tolerance with task completion, functional progression/regression promoting best outcomes inline with current rehab goals, as well as minimal verbal/tactile cuing alongside no physical assistance for safe and appropriate performance of today's activities. Continue with therapeutic focus on current POC outline.     Eval impression (06/17/2024): Pt. attended today's physical therapy session for evaluation of neck pain. Pt has complaints of 3/10 pain onset past 6 months after sleeping poorly on a couch. Pain is currently interrupting ability to perform ADLs and activity tolerance. Pt has notable deficits and would beenfit from therapeutic focus on posture,resting tone in L upper traps, cervical mobility, and postural endurance/strength.  Treatment performed today focused on pt education detailed in the objective. Pt demonstrated great understanding of education provided. required minimal v/tn cues and no assistance for appropriate performance with today's activities. Pt requires the intervention of skilled outpatient physical therapy to address the aforementioned deficits and progress towards a functional level in line with therapeutic goals.    OBJECTIVE IMPAIRMENTS: decreased activity tolerance, decreased mobility, decreased ROM, impaired perceived functional ability, impaired tone, impaired UE functional use, improper body mechanics, postural dysfunction, and pain.   ACTIVITY LIMITATIONS: carrying, lifting, dressing, and reach over head  PARTICIPATION LIMITATIONS: cleaning, laundry, driving, community activity, and occupation  PERSONAL FACTORS: Behavior pattern, Past/current experiences, and  Time since onset of injury/illness/exacerbation are also affecting patient's functional outcome.   REHAB POTENTIAL: Good  CLINICAL DECISION MAKING: Stable/uncomplicated  EVALUATION COMPLEXITY: Low   GOALS: Goals reviewed with patient? Yes  SHORT TERM GOALS: Target date:  07/08/2024  Pt will be independent with administered HEP to demonstrate the competency necessary for long term managemnet of symptoms at home.  Baseline:  Goal status: INITIAL  LONG TERM GOALS: Target date: 07/29/2024  Pt. Will achieve a NDI score of 8/50 (16%) as to demonstrate improvement in self-perceived functional ability with daily activities. Baseline: 14/50 (28%) Goal status: INITIAL  2.  Pt will report pain levels improving during ADLs to be less than or equal to 1/10 as to demonstrate improved tolerance with daily functional activities such as dressing or driving.  Baseline:  Goal status: INITIAL  3.   Pt will improve Cervical AROM to 90% of standardized norms with less than 1/10 pain to demonstrate necessary mobility for high quality and safe ADLs  Baseline:  Goal status: INITIAL ---------------------------------------------------------------------------------------------  PLAN:  PT FREQUENCY: 1-2x/week  PT DURATION: 6 weeks  PLANNED INTERVENTIONS: 97110-Therapeutic exercises, 97530- Therapeutic activity, 97112- Neuromuscular re-education, 97535- Self Care, 02859- Manual therapy, Patient/Family education, Taping, Joint mobilization, Spinal mobilization, and Moist heat  PLAN FOR NEXT SESSION: Progress as tolerated within outlined POC.   Mabel Kiang, PT, DPT 07/06/2024, 9:55 AM      For all possible CPT codes, reference the Planned Interventions line above.     Check all conditions that are expected to impact treatment: {Conditions expected to impact treatment:None of these apply

## 2024-07-07 DIAGNOSIS — F4310 Post-traumatic stress disorder, unspecified: Secondary | ICD-10-CM | POA: Diagnosis not present

## 2024-07-08 ENCOUNTER — Ambulatory Visit: Admitting: Physical Therapy

## 2024-07-09 ENCOUNTER — Other Ambulatory Visit: Payer: Self-pay

## 2024-07-09 ENCOUNTER — Ambulatory Visit
Admission: RE | Admit: 2024-07-09 | Discharge: 2024-07-09 | Disposition: A | Discharge status: Home / Self Care | Attending: Physician Assistant | Admitting: Physician Assistant

## 2024-07-09 ENCOUNTER — Telehealth: Payer: Self-pay

## 2024-07-09 ENCOUNTER — Ambulatory Visit: Payer: Self-pay

## 2024-07-09 VITALS — BP 100/69 | HR 90 | Temp 98.3°F | Resp 17 | Ht 61.0 in | Wt 126.1 lb

## 2024-07-09 DIAGNOSIS — H00012 Hordeolum externum right lower eyelid: Secondary | ICD-10-CM

## 2024-07-09 DIAGNOSIS — T61781A Other shellfish poisoning, accidental (unintentional), initial encounter: Secondary | ICD-10-CM

## 2024-07-09 DIAGNOSIS — Z91013 Allergy to seafood: Secondary | ICD-10-CM

## 2024-07-09 MED ORDER — EPIPEN 2-PAK 0.3 MG/0.3ML IJ SOAJ: 0.3000 mg | INTRAMUSCULAR | 1 refills | Status: DC | PRN

## 2024-07-09 NOTE — Telephone Encounter (Signed)
 This RN called patient at this time. Name and DOB verified. Pt has an appointment scheduled for 5:30 PM today at [facility] for allergic reaction. Advised pt to come into clinic now. Pt states she will be here in a few minutes.   Currently experiencing eye swelling and blurred vision. Denies SOB.

## 2024-07-09 NOTE — ED Triage Notes (Signed)
 Pt presents with a chief complaint of allergic reaction. Pt is allergic to seafood. States she went to a restaurant on Monday and ate broth that contained seafood. Did not know this at the time. Eyes began to swell and itch. Denies SOB. Currently rates overall pain a 6/10. OTC Benadryl  taken at 11 AM today with no improvement in symptoms.   Similar reaction two weeks ago after eating a seafood boil. Pt states she went to primary care and received an injection. Symptoms improved. Pt is only complaining of lump under right eye from this reaction now.

## 2024-07-09 NOTE — Telephone Encounter (Signed)
 FYI Only or Action Required?: FYI only for provider.  Patient was last seen in primary care on 07/01/2024 by Wheeler Harlene CROME, NP.  Called Nurse Triage reporting swollen eyes.  Symptoms began a week ago.  Interventions attempted: Nothing.  Symptoms are: unchanged.  Triage Disposition: See Physician Within 24 Hours  Patient/caregiver understands and will follow disposition?: Yes     Summary: eye swollen from allergies   Reason for Triage: swollen eye from allergies Was seen last week for this issue,eye has not gone down         Reason for Disposition  [1] SEVERE eyelid swelling (i.e., shut or almost) AND [2] involves both eyes AND [3] itchy  Answer Assessment - Initial Assessment Questions 1. ONSET: When did the swelling start? (e.g., minutes, hours, days)     Last Monday and seen last Wednesday. 2. LOCATION: What part of the eyelids is swollen?     Right eye bottom lid, states there is a bump 3. SEVERITY: How swollen is it?     severe 4. ITCHING: Is there any itching? If Yes, ask: How much?   (Scale 1-10; mild, moderate or severe)     severe 5. PAIN: Is the swelling painful to touch? If Yes, ask: How painful is it?   (Scale 1-10; mild, moderate or severe)     Hurts to blink 6. FEVER: Do you have a fever? If Yes, ask: What is it, how was it measured, and when did it start?      no 7. CAUSE: What do you think is causing the swelling?     sty 8. RECURRENT SYMPTOM: Have you had eyelid swelling before? If Yes, ask: When was the last time? What happened that time?     Yes, usually on the end of eye 9. OTHER SYMPTOMS: Do you have any other symptoms? (e.g., blurred vision, eye discharge, rash, runny nose)    lump 10. PREGNANCY: Is there any chance you are pregnant? When was your last menstrual period?       no  Protocols used: Eye - Swelling-A-AH

## 2024-07-09 NOTE — Discharge Instructions (Addendum)
 VISIT SUMMARY:  You came in today because of an allergic reaction and a lump on your right eye. The allergic reaction started last Monday and was initially managed with a prescription and a shot from your primary doctor, which helped for about a week. However, the symptoms returned, including itching and a lump on your right eye, making it difficult to close. You suspect the reaction may be related to recent dietary exposures to shellfish. Currently, you are experiencing itching and a sensation of heat on the right side of your face, particularly around the cheeks. You have started taking Benadryl  for symptom management.  YOUR PLAN:  -STY OF RIGHT UPPER EYELID: A sty is a small, painful lump on the eyelid caused by a clogged gland in the hair follicle. It is not an infection and typically resolves in about a week with warm compresses. You should apply warm compresses to the affected eyelid 2-3 times a day until the compress turns cold.  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 Continued exposure could lead to more severe reactions, including anaphylaxis. You should continue taking antihistamines like Benadryl  or Zyrtec and avoid all forms of shellfish and shellfish products, including broths and pastes. A prescription for an EpiPen  has been sent to [facility].  INSTRUCTIONS:  Please follow up with your primary doctor if your symptoms do not improve or if they worsen. Make sure to pick up your EpiPen  from [facility] and carry it with you at all times in case of a severe allergic reaction. If you start to have a severe reaction you should call 911 and go to the ER

## 2024-07-09 NOTE — ED Provider Notes (Signed)
 [name]    CSN: [id number] Arrival date & time: 07/09/24  1200      History   Chief Complaint Chief Complaint  Patient presents with   Allergic Reaction    I'm experiencing an allergic reaction after eating seafood. My right eye is swollen, painful when I blink, and feels irritated. The reaction started recently and is getting worse. I'd like to be seen as soon as possible. - Entered by patient    HPI Kaitlyn Crane is a 28 y.o. female.  has a past medical history of Infection (10/15/2010), No pertinent past medical history, and Urticaria.   HPI  Discussed the use of AI scribe software for clinical note transcription with the patient, who gave verbal consent to proceed.  The patient presents with an allergic reaction and a lump on the right eye.  The allergic reaction began last Monday, initially managed with a prescription and a shot from her primary doctor, which alleviated symptoms for about a week. However, the symptoms returned the following Monday, including itching and a lump on the right eye, making it difficult to close the eye.  She suspects the reaction may be related to recent dietary exposures, including a hot pot meal with shrimp, a spicy beef noodle dish containing shrimp paste at her sister's birthday dinner, and a seafood boil last Monday. She has experienced similar reactions in the past, which were managed with medication adjustments.  Currently, she is experiencing itching and a sensation of heat on the right side of her face, particularly around the cheeks. No tongue swelling, throat closing, shortness of breath, difficulty breathing, drooling, choking, nausea, vomiting, diarrhea, lightheadedness, dizziness, or passing out. She notes some mucus in the throat but no pain or difficulty breathing.  For symptom management, she has started taking Benadryl , with doses taken last night and this morning.    Past Medical History:  Diagnosis Date    Infection 10/15/2010   UTERUS ?   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 Authorizing Provider  buPROPion (WELLBUTRIN XL) 150 MG 24 hr  tablet Take 150 mg by mouth daily.    [provider]  cromolyn  (OPTICROM ) 4 % ophthalmic solution Place 1 drop into both eyes 4 (four) times daily as needed. 06/11/24   Jeneal Danita Macintosh, MD  cyclobenzaprine  (FLEXERIL ) 5 MG tablet Take 1 tablet (5 mg total) by mouth at bedtime as needed (muscle pain). Patient not taking: Reported on 06/11/2024 03/26/24   Cyndi Shaver, PA-C  EPIPEN  2-PAK 0.3 MG/0.3ML SOAJ injection Inject 0.3 mg into the muscle as needed for anaphylaxis. 07/09/24   Miracle Mongillo E, PA-C  ipratropium (ATROVENT ) 0.06 % nasal spray 2 sprays each nostril up to 3-4 times a day as needed. 06/11/24   Jeneal Danita Macintosh, MD  ISOtretinoin (ACCUTANE) 40 MG capsule Take 40 mg by mouth 2 (two) times daily. Patient not taking: Reported on 06/11/2024 03/13/24   [provider]  levocetirizine (XYZAL ) 5 MG tablet Take 1 tablet (5 mg total) by mouth every evening. 07/01/24   Wheeler Harlene CROME, NP  levonorgestrel  (MIRENA , 52 MG,) 20 MCG/DAY IUD used office stock 12/26/22   [provider]  lurasidone (LATUDA) 40 MG TABS tablet Take 1 tablet by mouth daily.    [provider]  meloxicam  (MOBIC ) 15 MG tablet Take 1 tablet (15 mg total) by mouth daily as needed (pain, headache). Wait 24 hours from the toradol  shot today (1:45 pm) to start 03/05/24   Drubel, Shaver, PA-C  metFORMIN (GLUCOPHAGE-XR) 500 MG 24 hr tablet Take 500 mg by mouth 2 (two) times daily. 05/25/24   [provider]  omalizumab  (XOLAIR ) 300 MG/2  ML prefilled syringe Inject 300 mg into the skin every 28 (twenty-eight) days. 06/25/24   Jeneal Danita Macintosh, MD  omeprazole (PRILOSEC) 20 MG capsule Take 20 mg by mouth 2 (two) times daily before a meal. Patient not taking: Reported on 06/11/2024 08/31/22   [provider]  traZODone  (DESYREL ) 50 MG tablet Take 50 mg by mouth at bedtime.    [provider]  triamcinolone  ointment (KENALOG ) 0.1 % Apply sparingly to affected areas twice daily as needed below face and neck 06/11/24   Jeneal Danita Macintosh, MD  VENTOLIN  HFA 108 [phone] Base) MCG/ACT inhaler Inhale 2 puffs into the lungs every 6 (six) hours as needed for wheezing or shortness of breath. 06/11/24   Jeneal Danita Macintosh, MD    Family History Family History  Problem Relation Age of Onset   Stroke Other    Hypertension Other    Asthma Other    Alcohol abuse Neg Hx     Social History Social History   Tobacco Use   Smoking status: Never   Smokeless tobacco: Never  Vaping Use   Vaping status: Never Used  Substance Use Topics   Alcohol use: No   Drug use: Not Currently      Allergies   Shellfish allergy , Carbamide peroxide, and Nickel sulfate [nickel]   Review of Systems Review of Systems  HENT:  Positive for postnasal drip. Negative for drooling.   Respiratory:  Negative for cough, choking, shortness of breath and wheezing.   Gastrointestinal:  Negative for abdominal pain, diarrhea, nausea and vomiting.  Skin:  Negative for rash.  Neurological:  Negative for dizziness, syncope, light-headedness and headaches.     Physical Exam Triage Vital Signs ED Triage Vitals  Encounter Vitals Group     BP 07/09/24 1217 100/69     Girls Systolic BP Percentile --      Girls Diastolic BP Percentile --  Boys Systolic BP Percentile --      Boys Diastolic BP Percentile --      Pulse Rate 07/09/24 1217 90     Resp 07/09/24 1217 17     Temp 07/09/24 1217 98.3 F (36.8 C)     Temp Source 07/09/24 1217 Oral     SpO2 07/09/24 1217 96 %     Weight 07/09/24 1219 126 lb 1.7 oz (57.2 kg)     Height 07/09/24 1219 5' 1 (1.549 m)     Head Circumference --      Peak Flow --      Pain Score 07/09/24 1218 6     Pain Loc --      Pain Education --      Exclude from Growth Chart --    No data found.  Updated Vital Signs BP 100/69 (BP Location: Right Arm)   Pulse 90   Temp 98.3 F (36.8 C) (Oral)   Resp 17   Ht 5' 1 (1.549 m)   Wt 126 lb 1.7 oz (57.2 kg)   SpO2 96%   BMI 23.83 kg/m   Visual Acuity Right Eye Distance:   Left Eye Distance:   Bilateral Distance:    Right Eye Near:   Left Eye Near:    Bilateral Near:     Physical Exam Vitals reviewed.  Constitutional:      General: She is awake.     Appearance: Normal appearance. She is well-developed and well-groomed.  HENT:     Head: Normocephalic and atraumatic.     Jaw: No trismus or swelling.     Mouth/Throat:     Lips: Pink.     Mouth: Mucous membranes are moist.     Tongue: No lesions. Tongue does not deviate from midline.     Pharynx: Oropharynx is clear. Uvula midline. No  pharyngeal swelling, oropharyngeal exudate, posterior oropharyngeal erythema, uvula swelling or postnasal drip.     Tonsils: No tonsillar exudate or tonsillar abscesses. 0 on the right. 0 on the left.  Eyes:     General: Lids are normal. Gaze aligned appropriately.        Right eye: Hordeolum present.     Extraocular Movements: Extraocular movements intact.     Conjunctiva/sclera: Conjunctivae normal.     Pupils: Pupils are equal, round, and reactive to light.  Neck:     Trachea: Phonation normal.  Cardiovascular:     Rate and Rhythm: Normal rate and regular rhythm.     Heart sounds: Normal heart sounds. No murmur heard.    No friction rub. No gallop.  Pulmonary:     Effort: Pulmonary effort is normal.     Breath sounds: Normal breath sounds. No decreased air movement. No decreased breath sounds, wheezing, rhonchi or rales.  Lymphadenopathy:     Head:     Right side of head: No submental, submandibular or preauricular adenopathy.     Left side of head: No submental, submandibular or preauricular adenopathy.  Skin:    General: Skin is warm and dry.     Findings: No lesion or rash.  Neurological:     Mental Status: She is alert and oriented to person, place, and time.     Cranial Nerves: No cranial nerve deficit, dysarthria or facial asymmetry.  Psychiatric:        Attention and Perception: Attention and perception normal.        Mood and Affect: Mood and affect normal.  Speech: Speech normal.        Behavior: Behavior normal. Behavior is cooperative.      UC Treatments / Results  Labs (all labs ordered are listed, but only abnormal results are displayed) Labs Reviewed - No data to display  EKG   Radiology No results found.  Procedures Procedures (including critical care time)  Medications Ordered in UC Medications - No data to display  Initial Impression / Assessment and Plan / UC Course  I have reviewed the triage vital signs and the nursing  notes.  Pertinent labs & imaging results that were available during my care of the patient were reviewed by me and considered in my medical decision making (see chart for details).      Final Clinical Impressions(s) / UC Diagnoses   Final diagnoses:  Hordeolum externum of right lower eyelid  Allergic reaction to shellfish   Stye of right lower eyelid Stye on the right lower eyelid causing difficulty in closing the eye, likely due to a clogged gland in the hair follicle. Reviewed this is not an infection. Typically resolves in about a week with warm compresses. - Apply warm compresses to the affected eyelid 2-3 times a day until the compress turns cold. - Provide information on sty management.  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 Symptoms include itching and heat sensation on the right side of the face. No signs of anaphylaxis currently, but continued exposure could lead to more severe reactions, including anaphylaxis. - Continue taking antihistamines such as Benadryl  or zyrtec - Avoid all forms of shellfish and shellfish products, including broths and pastes. - Send prescription for EpiPen  to [facility]    Discharge Instructions      VISIT SUMMARY:  You came in today because of an allergic reaction and a lump on your right eye. The allergic reaction started last Monday and was initially managed with a prescription and a shot from your primary doctor, which helped for about a week. However, the symptoms returned, including itching and a lump on your right eye, making it difficult to close. You suspect the reaction may be related to recent dietary exposures to shellfish. Currently, you are experiencing itching and a sensation of heat on the right side of your face, particularly around the cheeks. You have started taking Benadryl  for symptom  management.  YOUR PLAN:  -STY OF RIGHT UPPER EYELID: A sty is a small, painful lump on the eyelid caused by a clogged gland in the hair follicle. It is not an infection and typically resolves in about a week with warm compresses. You should apply warm compresses to the affected eyelid 2-3 times a day until the compress turns cold.  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 Continued exposure could lead to more severe reactions, including anaphylaxis. You should continue taking antihistamines like Benadryl  or Zyrtec and avoid all forms of shellfish and shellfish products, including broths and pastes. A prescription for an EpiPen  has been sent to [facility].  INSTRUCTIONS:  Please follow up with your primary doctor if your symptoms do not improve or if they worsen. Make sure to pick up your EpiPen  from [facility] and carry it with you at all times in case of a severe allergic reaction. If you start to have a  severe reaction you should call 911 and go to the ER     ED Prescriptions     Medication Sig Dispense Auth. Provider   EPIPEN  2-PAK 0.3 MG/0.3ML SOAJ injection Inject 0.3 mg into the muscle as needed for anaphylaxis. 2 each Jaelee Laughter E, PA-C      PDMP not reviewed this encounter.   Marylene Rocky BRAVO, PA-C 07/09/24 1323

## 2024-07-10 ENCOUNTER — Ambulatory Visit: Admitting: Family Medicine

## 2024-07-10 ENCOUNTER — Encounter: Payer: Self-pay | Admitting: Family Medicine

## 2024-07-10 VITALS — BP 106/79 | HR 84 | Temp 97.8°F | Ht 61.0 in | Wt 124.0 lb

## 2024-07-10 DIAGNOSIS — Z91013 Allergy to seafood: Secondary | ICD-10-CM

## 2024-07-10 DIAGNOSIS — J069 Acute upper respiratory infection, unspecified: Secondary | ICD-10-CM | POA: Diagnosis not present

## 2024-07-10 DIAGNOSIS — H00012 Hordeolum externum right lower eyelid: Secondary | ICD-10-CM

## 2024-07-10 MED ORDER — PHENOL 1.4 % MT LIQD: 1.0000 | OROMUCOSAL | 0 refills | Status: AC | PRN

## 2024-07-10 MED ORDER — ERYTHROMYCIN 5 MG/GM OP OINT: 1.0000 | TOPICAL_OINTMENT | Freq: Two times a day (BID) | OPHTHALMIC | 0 refills | Status: AC

## 2024-07-10 MED ORDER — EPIPEN 2-PAK 0.3 MG/0.3ML IJ SOAJ: 0.3000 mg | INTRAMUSCULAR | 1 refills | Status: AC | PRN

## 2024-07-10 NOTE — Progress Notes (Signed)
 Acute Office Visit  Subjective:     Patient ID: Kaitlyn Crane, female    DOB: [birth year], 28 y.o.   MRN: [id number]  Chief Complaint  Patient presents with   Eye Problem    Patient is in today for eye concern.   Discussed the use of AI scribe software for clinical note transcription with the patient, who gave verbal consent to proceed.  History of Present Illness Kaitlyn Crane is a 28 year old female who presents with a painful sty on the right lower eyelid.  She has been experiencing irritation and pain in the right lower eyelid for over two weeks. The symptoms began after consuming foods containing shrimp paste, which she suspects triggered an allergic reaction. She recalls eating beef noodles with shrimp paste two weeks ago on a Saturday, followed by another meal on Monday that exacerbated her symptoms. Despite some improvement by the following Saturday, another meal containing shrimp worsened her condition.  She was initially treated with steroids for an allergic reaction, which resolved symptoms, but stye has remained irritated.   She has been using warm compresses as a home remedy, applying them multiple times a day. She has not used any eye drops.  She does not have an eye doctor and is unsure if she needs a referral. No current issues with breathing and no itchy eyes, though she experiences drainage triggering a sore throat.           ROS All review of systems negative except what is listed in the HPI      Objective:    BP 106/79   Pulse 84   Temp 97.8 F (36.6 C) (Oral)   Ht 5' 1 (1.549 m)   Wt 124 lb (56.2 kg)   SpO2 98%   BMI 23.43 kg/m    Physical Exam Vitals reviewed.  Constitutional:      Appearance: Normal appearance.  HENT:     Mouth/Throat:     Pharynx: Postnasal drip present.  Eyes:     General:        Right eye: Hordeolum present.  Cardiovascular:     Rate and Rhythm: Normal rate and regular rhythm.     Heart sounds: Normal heart sounds.   Pulmonary:     Effort: Pulmonary effort is normal.     Breath sounds: Normal breath sounds. No wheezing, rhonchi or rales.  Musculoskeletal:     Cervical back: Normal range of motion and neck supple. No tenderness.  Lymphadenopathy:     Cervical: No cervical adenopathy.  Skin:    General: Skin is warm and dry.  Neurological:     Mental Status: She is alert and oriented to person, place, and time.  Psychiatric:        Mood and Affect: Mood normal.        Behavior: Behavior normal.        Thought Content: Thought content normal.        Judgment: Judgment normal.     No results found for any visits on 07/10/24.      Assessment & Plan:   Problem List Items Addressed This Visit       Active Problems   Shellfish allergy  Pharmacy did not have EpiPen  in-stock. Requesting I send to a different pharmacy.   Relevant Medications   EPIPEN  2-PAK 0.3 MG/0.3ML SOAJ injection   Other Visit Diagnoses       Hordeolum externum of right lower eyelid    -  Primary Persistent  right eye hordeolum with pain and irritation. - Continue warm compresses 5-10 times daily. - Prescribe erythromycin  ointment for lower lid twice daily for 7-10 days. Advised antibiotics usually are not necessary but she would like to try given size of stye and duration of symptoms. - Refer to eye doctor if no improvement.   Relevant Medications   erythromycin  ophthalmic ointment   Other Relevant Orders   Ambulatory referral to Optometry     Viral URI     - Advise COVID-19 test tomorrow. - Encourage increased fluid intake. - Prescribe throat spray from [facility]. - Recommend Flonase  nasal spray and Mucine   Relevant Medications   phenol (CHLORASEPTIC) 1.4 % LIQD         Meds ordered this encounter  Medications   EPIPEN  2-PAK 0.3 MG/0.3ML SOAJ injection    Sig: Inject 0.3 mg into the muscle as needed for anaphylaxis.    Dispense:  2 each    Refill:  1    Supervising Provider:   DOMENICA BLACKBIRD A [[id number]]    phenol (CHLORASEPTIC) 1.4 % LIQD    Sig: Use as directed 1 spray in the mouth or throat as needed for throat irritation / pain.    Dispense:  177 mL    Refill:  0    Supervising Provider:   DOMENICA BLACKBIRD A [[id number]]   erythromycin  ophthalmic ointment    Sig: Place 1 Application into the right eye 2 (two) times daily. Continue for 7-10 days    Dispense:  3.5 g    Refill:  0    Supervising Provider:   DOMENICA BLACKBIRD A [[id number]]    Return if symptoms worsen or fail to improve.  Waddell KATHEE Mon, NP

## 2024-07-10 NOTE — Telephone Encounter (Signed)
 Tammy still working on approval

## 2024-07-10 NOTE — Patient Instructions (Signed)
 Likely a viral upper respiratory infection  Continue supportive measures including rest, hydration, humidifier use, steam showers, warm compresses to sinuses, warm liquids with lemon and honey, and over-the-counter cough, cold, and analgesics as needed. If symptoms persist 8-10 days, become severe, or return after a few days of feeling better, then please follow-up for repeat evaluation to determine if antibiotics may be necessary.  Over the counter medications that may be helpful for symptoms:  Guaifenesin 1200 mg extended release tabs twice daily, with plenty of water For cough and congestion Brand name: Mucinex   Pseudoephedrine 30 mg, one or two tabs every 4 to 6 hours For sinus congestion Brand name: Sudafed You must get this from the pharmacy counter.  Oxymetazoline nasal spray each morning, one spray in each nostril, for NO MORE THAN 3 days  For nasal and sinus congestion Brand name: Afrin Saline nasal spray or Saline Nasal Irrigation (Netti Pot, etc) 3-5 times a day For nasal and sinus congestion Brand names: Ocean or AYR Fluticasone nasal spray OR Mometasone nasal spray OR Triamcinolone Acetonide nasal spray - follow directions on the packaging For nasal and sinus congestion Brand name: Flonase, Nasonex, Nasacort Warm salt water gargles  For sore throat Every few hours as needed Alternate ibuprofen 400-600 mg and acetaminophen 1000 mg every 6 hours For fever, body aches, headache Brand names: Motrin or Advil and Tylenol Dextromethorphan 12-hour cough version 30 mg every 12 hours  For cough Brand name: Delsym Stop all other cold medications for now (Nyquil, Dayquil, Tylenol Cold, Theraflu, etc) and other non-prescription cough/cold preparations. Many of these have the same ingredients listed above and could cause an overdose of medication.   Herbal treatments that have been shown to be helpful in some patients include: Vitamin C 1000 mg per day Zinc 100 mg per  day Quercetin 25-500 mg twice a day Melatonin 5-10mg  at bedtime Honey Green Tea  General Instructions Allow your body to rest Drink PLENTY of fluids Typically, we are the most contagious 1-2 days before symptoms start through the first 2-3 days of most severe symptoms. Per CDC guidelines, you can return to school/work when symptoms have started to improve and you have been fever-free for 24 hours. However, recommend you continue extra precautions for the following 5 days (frequent hand hygiene, masking, covering coughs/sneezes, minimize exposure to immunocompromised individuals, etc).  If you develop severe shortness of breath, uncontrolled fevers, coughing up blood, confusion, chest pain, or signs of dehydration (such as significantly decreased urine amounts or dizziness with standing) please go to the nearest ER.

## 2024-07-14 ENCOUNTER — Ambulatory Visit: Admitting: Physical Therapy

## 2024-07-14 DIAGNOSIS — F4310 Post-traumatic stress disorder, unspecified: Secondary | ICD-10-CM | POA: Diagnosis not present

## 2024-07-15 NOTE — Telephone Encounter (Signed)
 Called patient and advised that the Ins came back again and advised denial for Xolair  X 2 and will need to sign appeal form for same. She will do same next week

## 2024-07-16 ENCOUNTER — Ambulatory Visit: Admitting: Physical Therapy

## 2024-07-23 ENCOUNTER — Other Ambulatory Visit (HOSPITAL_COMMUNITY): Payer: Self-pay

## 2024-07-24 ENCOUNTER — Other Ambulatory Visit: Payer: Self-pay

## 2024-07-24 ENCOUNTER — Other Ambulatory Visit (HOSPITAL_COMMUNITY): Payer: Self-pay

## 2024-07-24 NOTE — Progress Notes (Signed)
 Specialty Pharmacy Initial Fill Coordination Note  Kaitlyn Crane is a 28 y.o. female contacted today regarding initial fill of specialty medication(s) Omalizumab  (Xolair )   Patient requested Courier to Provider Office   Delivery date: 07/28/24   Verified address: [address] [place] [address]   Medication will be filled on 10/13.   Patient is aware of $4.00 copayment.

## 2024-07-24 NOTE — Progress Notes (Signed)
 Specialty Pharmacy Initiation Note   Kaitlyn Crane is a 28 y.o. female who will be followed by the specialty pharmacy service for RxSp Allergy     Review of administration, indication, effectiveness, safety, potential side effects, storage/disposable, and missed dose instructions occurred today for patient's specialty medication(s) Omalizumab  (Xolair )     Patient/Caregiver did not have any additional questions or concerns.   Patient's therapy is appropriate to: Initiate    Goals Addressed             This Visit's Progress    Minimize recurrence of flares       Patient is re-initiating therapy. Patient will maintain adherence          [name] Specialty Pharmacist

## 2024-07-27 ENCOUNTER — Other Ambulatory Visit: Payer: Self-pay

## 2024-07-31 ENCOUNTER — Ambulatory Visit

## 2024-07-31 DIAGNOSIS — Z91013 Allergy to seafood: Secondary | ICD-10-CM | POA: Diagnosis not present

## 2024-07-31 MED ORDER — OMALIZUMAB 300 MG/2  ML ~~LOC~~ SOSY
300.0000 mg | PREFILLED_SYRINGE | Freq: Once | SUBCUTANEOUS | Status: AC
  Administered 2024-07-31: 300 mg via SUBCUTANEOUS

## 2024-08-13 ENCOUNTER — Ambulatory Visit: Admitting: Dietician

## 2024-08-17 ENCOUNTER — Other Ambulatory Visit: Payer: Self-pay

## 2024-08-17 ENCOUNTER — Other Ambulatory Visit (HOSPITAL_COMMUNITY): Payer: Self-pay

## 2024-08-17 NOTE — Progress Notes (Signed)
 Specialty Pharmacy Refill Coordination Note  Kaitlyn Crane is a 28 y.o. female assessed today regarding refills of clinic administered specialty medication(s) Omalizumab  (Xolair )   Clinic requested Courier to Provider Office   Delivery date: 08/25/24   Verified address: [address] [place] [address]   Medication will be filled on: 08/24/24

## 2024-08-24 ENCOUNTER — Other Ambulatory Visit: Payer: Self-pay

## 2024-08-28 ENCOUNTER — Ambulatory Visit (INDEPENDENT_AMBULATORY_CARE_PROVIDER_SITE_OTHER)

## 2024-08-28 DIAGNOSIS — Z91013 Allergy to seafood: Secondary | ICD-10-CM

## 2024-08-28 MED ORDER — OMALIZUMAB 300 MG/2  ML ~~LOC~~ SOSY
300.0000 mg | PREFILLED_SYRINGE | SUBCUTANEOUS | Status: AC
  Administered 2024-08-28 – 2024-10-23 (×3): 300 mg via SUBCUTANEOUS

## 2024-09-14 ENCOUNTER — Other Ambulatory Visit: Payer: Self-pay

## 2024-09-14 NOTE — Progress Notes (Signed)
 Specialty Pharmacy Refill Coordination Note  Kaitlyn Crane is a 28 y.o. female assessed today regarding refills of clinic administered specialty medication(s) Omalizumab  (XOLAIR )   Clinic requested Courier to Provider Office   Delivery date: 09/21/24   Verified address: [address] [place] [address]   Medication will be filled on: 09/18/24  Appointment 09/25/24.

## 2024-09-17 DIAGNOSIS — Z111 Encounter for screening for respiratory tuberculosis: Secondary | ICD-10-CM | POA: Diagnosis not present

## 2024-09-18 ENCOUNTER — Other Ambulatory Visit: Payer: Self-pay

## 2024-09-24 ENCOUNTER — Other Ambulatory Visit (HOSPITAL_COMMUNITY): Payer: Self-pay

## 2024-09-25 ENCOUNTER — Ambulatory Visit (INDEPENDENT_AMBULATORY_CARE_PROVIDER_SITE_OTHER)

## 2024-09-25 DIAGNOSIS — Z91013 Allergy to seafood: Secondary | ICD-10-CM

## 2024-10-02 ENCOUNTER — Other Ambulatory Visit: Payer: Self-pay

## 2024-10-07 ENCOUNTER — Other Ambulatory Visit: Payer: Self-pay

## 2024-10-07 NOTE — Progress Notes (Signed)
 Specialty Pharmacy Refill Coordination Note  Kaitlyn Crane is a 28 y.o. female assessed today regarding refills of clinic administered specialty medication(s) Omalizumab  (XOLAIR )   Clinic requested Courier to Provider Office   Delivery date: 10/20/24   Verified address: [address] [place] [address]   Medication will be filled on: 10/19/24

## 2024-10-19 ENCOUNTER — Other Ambulatory Visit: Payer: Self-pay

## 2024-10-23 ENCOUNTER — Ambulatory Visit: Admitting: *Deleted

## 2024-10-23 DIAGNOSIS — Z91013 Allergy to seafood: Secondary | ICD-10-CM

## 2024-10-27 ENCOUNTER — Ambulatory Visit: Admitting: Family

## 2024-11-06 ENCOUNTER — Other Ambulatory Visit: Payer: Self-pay

## 2024-11-06 NOTE — Progress Notes (Signed)
 Specialty Pharmacy Refill Coordination Note  Kaitlyn Crane is a 29 y.o. female assessed today regarding refills of clinic administered specialty medication(s) Omalizumab  (XOLAIR )   Clinic requested Courier to Provider Office   Delivery date: 11/16/24   Verified address: [address] [place] [address]   Medication will be filled on: 11/13/24  Appointment 11/20/24.

## 2024-11-13 ENCOUNTER — Other Ambulatory Visit: Payer: Self-pay
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
## 2024-11-18 ENCOUNTER — Other Ambulatory Visit: Payer: Self-pay

## 2024-11-20 ENCOUNTER — Ambulatory Visit

## 2024-12-10 ENCOUNTER — Ambulatory Visit: Admitting: Allergy

## 2025-01-22 ENCOUNTER — Encounter: Admitting: Student

## 2025-02-04 ENCOUNTER — Ambulatory Visit: Admitting: Allergy
